# Patient Record
Sex: Female | Born: 1935 | Race: White | Hispanic: No | Smoking: Never smoker
Health system: Southern US, Community
[De-identification: ages and names within clinical notes are randomized; demographics above are authoritative.]

## PROBLEM LIST (undated history)

## (undated) DIAGNOSIS — F0280 Dementia in other diseases classified elsewhere without behavioral disturbance: Secondary | ICD-10-CM

## (undated) DIAGNOSIS — M8580 Other specified disorders of bone density and structure, unspecified site: Secondary | ICD-10-CM

## (undated) DIAGNOSIS — K219 Gastro-esophageal reflux disease without esophagitis: Secondary | ICD-10-CM

## (undated) DIAGNOSIS — E119 Type 2 diabetes mellitus without complications: Secondary | ICD-10-CM

## (undated) DIAGNOSIS — I639 Cerebral infarction, unspecified: Secondary | ICD-10-CM

## (undated) DIAGNOSIS — I10 Essential (primary) hypertension: Secondary | ICD-10-CM

## (undated) DIAGNOSIS — G309 Alzheimer's disease, unspecified: Secondary | ICD-10-CM

## (undated) DIAGNOSIS — N183 Chronic kidney disease, stage 3 (moderate): Secondary | ICD-10-CM

---

## 1997-06-14 ENCOUNTER — Other Ambulatory Visit: Admission: RE | Admit: 1997-06-14 | Discharge: 1997-06-14 | Payer: Self-pay | Admitting: *Deleted

## 1998-04-12 ENCOUNTER — Other Ambulatory Visit: Admission: RE | Admit: 1998-04-12 | Discharge: 1998-04-12 | Payer: Self-pay | Admitting: *Deleted

## 2004-10-08 ENCOUNTER — Emergency Department (HOSPITAL_COMMUNITY): Admission: EM | Admit: 2004-10-08 | Discharge: 2004-10-08 | Payer: Self-pay | Admitting: Emergency Medicine

## 2008-09-14 ENCOUNTER — Encounter: Admission: RE | Admit: 2008-09-14 | Discharge: 2008-09-14 | Payer: Self-pay | Admitting: Family Medicine

## 2009-05-31 ENCOUNTER — Emergency Department (HOSPITAL_COMMUNITY): Admission: EM | Admit: 2009-05-31 | Discharge: 2009-06-01 | Payer: Self-pay | Admitting: Emergency Medicine

## 2010-02-20 ENCOUNTER — Inpatient Hospital Stay (HOSPITAL_COMMUNITY)
Admission: EM | Admit: 2010-02-20 | Discharge: 2010-02-22 | Payer: Self-pay | Source: Home / Self Care | Attending: Internal Medicine | Admitting: Internal Medicine

## 2010-02-21 ENCOUNTER — Encounter (INDEPENDENT_AMBULATORY_CARE_PROVIDER_SITE_OTHER): Payer: Self-pay | Admitting: Internal Medicine

## 2010-05-13 LAB — COMPREHENSIVE METABOLIC PANEL
ALT: 17 U/L (ref 0–35)
ALT: 17 U/L (ref 0–35)
AST: 22 U/L (ref 0–37)
Albumin: 3.2 g/dL — ABNORMAL LOW (ref 3.5–5.2)
CO2: 29 mEq/L (ref 19–32)
Calcium: 8.6 mg/dL (ref 8.4–10.5)
Calcium: 8.8 mg/dL (ref 8.4–10.5)
Chloride: 93 mEq/L — ABNORMAL LOW (ref 96–112)
GFR calc Af Amer: >60 mL/min (ref >60)
GFR calc Af Amer: >60 mL/min (ref >60)
GFR calc non Af Amer: 57 mL/min — ABNORMAL LOW (ref >60)
Glucose, Bld: 155 mg/dL — ABNORMAL HIGH (ref 70–99)
Sodium: 129 mEq/L — ABNORMAL LOW (ref 135–145)
Sodium: 132 mEq/L — ABNORMAL LOW (ref 135–145)
Total Protein: 6.6 g/dL (ref 6.0–8.3)

## 2010-05-13 LAB — DIFFERENTIAL
Eosinophils Absolute: 0 10*3/uL (ref 0.0–0.7)
Lymphocytes Relative: 10 % — ABNORMAL LOW (ref 12–46)
Lymphs Abs: 1.3 10*3/uL (ref 0.7–4.0)
Monocytes Relative: 4 % (ref 3–12)
Neutrophils Relative %: 86 % — ABNORMAL HIGH (ref 43–77)

## 2010-05-13 LAB — CBC
HCT: 32.1 % — ABNORMAL LOW (ref 36.0–46.0)
HCT: 34.6 % — ABNORMAL LOW (ref 36.0–46.0)
HCT: 35 % — ABNORMAL LOW (ref 36.0–46.0)
Hemoglobin: 10.7 g/dL — ABNORMAL LOW (ref 12.0–15.0)
Hemoglobin: 11.8 g/dL — ABNORMAL LOW (ref 12.0–15.0)
MCH: 27.6 pg (ref 26.0–34.0)
MCH: 28.4 pg (ref 26.0–34.0)
MCHC: 34.1 g/dL (ref 30.0–36.0)
MCHC: 34.3 g/dL (ref 30.0–36.0)
MCHC: 34.4 g/dL (ref 30.0–36.0)
MCV: 82.6 fL (ref 78.0–100.0)
MCV: 82.7 fL (ref 78.0–100.0)
MCV: 82.8 fL (ref 78.0–100.0)
Platelets: 247 10*3/uL (ref 150–400)
RBC: 3.88 MIL/uL (ref 3.87–5.11)
RDW: 12.5 % (ref 11.5–15.5)
RDW: 12.5 % (ref 11.5–15.5)
WBC: 12.2 10*3/uL — ABNORMAL HIGH (ref 4.0–10.5)

## 2010-05-13 LAB — URINE CULTURE: Culture  Setup Time: 201112211743

## 2010-05-13 LAB — POCT CARDIAC MARKERS
CKMB, poc: 1.1 ng/mL (ref 1.0–8.0)
Myoglobin, poc: 158 ng/mL (ref 12–200)

## 2010-05-13 LAB — URINE MICROSCOPIC-ADD ON

## 2010-05-13 LAB — LIPID PANEL
HDL: 31 mg/dL — ABNORMAL LOW (ref >39)
Total CHOL/HDL Ratio: 4.9 RATIO
Triglycerides: 122 mg/dL (ref <150)
VLDL: 24 mg/dL (ref 0–40)

## 2010-05-13 LAB — BASIC METABOLIC PANEL
CO2: 26 mEq/L (ref 19–32)
CO2: 26 mEq/L (ref 19–32)
Calcium: 8.7 mg/dL (ref 8.4–10.5)
Chloride: 102 mEq/L (ref 96–112)
Chloride: 96 mEq/L (ref 96–112)
Creatinine, Ser: 0.96 mg/dL (ref 0.4–1.2)
Creatinine, Ser: 1.01 mg/dL (ref 0.4–1.2)
GFR calc Af Amer: >60 mL/min (ref >60)
Glucose, Bld: 137 mg/dL — ABNORMAL HIGH (ref 70–99)
Potassium: 3.7 mEq/L (ref 3.5–5.1)
Sodium: 131 mEq/L — ABNORMAL LOW (ref 135–145)

## 2010-05-13 LAB — GLUCOSE, CAPILLARY
Glucose-Capillary: 132 mg/dL — ABNORMAL HIGH (ref 70–99)
Glucose-Capillary: 133 mg/dL — ABNORMAL HIGH (ref 70–99)
Glucose-Capillary: 133 mg/dL — ABNORMAL HIGH (ref 70–99)

## 2010-05-13 LAB — CK TOTAL AND CKMB (NOT AT ARMC): CK, MB: 1.8 ng/mL (ref 0.3–4.0)

## 2010-05-13 LAB — HEMOGLOBIN A1C
Hgb A1c MFr Bld: 7.4 % — ABNORMAL HIGH (ref <5.7)
Mean Plasma Glucose: 166 mg/dL — ABNORMAL HIGH (ref <117)

## 2010-05-13 LAB — URINALYSIS, ROUTINE W REFLEX MICROSCOPIC
Bilirubin Urine: NEGATIVE
Hgb urine dipstick: NEGATIVE
Ketones, ur: NEGATIVE mg/dL
Protein, ur: NEGATIVE mg/dL
Urobilinogen, UA: 1 mg/dL (ref 0.0–1.0)

## 2010-05-13 LAB — PROTIME-INR: INR: 0.99 (ref 0.00–1.49)

## 2010-05-13 LAB — APTT: aPTT: 26 seconds (ref 24–37)

## 2010-05-13 LAB — CARDIAC PANEL(CRET KIN+CKTOT+MB+TROPI): Troponin I: 0.05 ng/mL (ref 0.00–0.06)

## 2010-05-27 LAB — CBC
MCHC: 34.1 g/dL (ref 30.0–36.0)
MCV: 86.2 fL (ref 78.0–100.0)
Platelets: 224 10*3/uL (ref 150–400)
RBC: 3.83 MIL/uL — ABNORMAL LOW (ref 3.87–5.11)
RDW: 12.8 % (ref 11.5–15.5)

## 2010-05-27 LAB — DIFFERENTIAL
Eosinophils Absolute: 0.1 10*3/uL (ref 0.0–0.7)
Eosinophils Relative: 1 % (ref 0–5)
Lymphocytes Relative: 15 % (ref 12–46)
Lymphs Abs: 1.3 10*3/uL (ref 0.7–4.0)
Monocytes Relative: 8 % (ref 3–12)

## 2010-05-27 LAB — RAPID URINE DRUG SCREEN, HOSP PERFORMED
Amphetamines: NOT DETECTED
Barbiturates: NOT DETECTED
Benzodiazepines: NOT DETECTED
Cocaine: NOT DETECTED
Opiates: NOT DETECTED

## 2010-05-27 LAB — COMPREHENSIVE METABOLIC PANEL
ALT: 23 U/L (ref 0–35)
AST: 29 U/L (ref 0–37)
Albumin: 3.3 g/dL — ABNORMAL LOW (ref 3.5–5.2)
CO2: 26 mEq/L (ref 19–32)
Calcium: 8.6 mg/dL (ref 8.4–10.5)
Creatinine, Ser: 1.15 mg/dL (ref 0.4–1.2)
GFR calc Af Amer: 55 mL/min — ABNORMAL LOW (ref >60)
GFR calc non Af Amer: 45 mL/min — ABNORMAL LOW (ref >60)
Sodium: 129 mEq/L — ABNORMAL LOW (ref 135–145)
Total Protein: 6.4 g/dL (ref 6.0–8.3)

## 2010-05-27 LAB — URINALYSIS, ROUTINE W REFLEX MICROSCOPIC
Bilirubin Urine: NEGATIVE
Glucose, UA: NEGATIVE mg/dL
Ketones, ur: NEGATIVE mg/dL
Nitrite: POSITIVE — AB
Specific Gravity, Urine: 1.015 (ref 1.005–1.030)
pH: 6.5 (ref 5.0–8.0)

## 2010-05-27 LAB — URINE CULTURE: Colony Count: >=100000

## 2010-05-27 LAB — URINE MICROSCOPIC-ADD ON

## 2010-10-02 ENCOUNTER — Encounter: Payer: Self-pay | Admitting: Podiatry

## 2012-02-11 ENCOUNTER — Inpatient Hospital Stay (HOSPITAL_BASED_OUTPATIENT_CLINIC_OR_DEPARTMENT_OTHER)
Admission: EM | Admit: 2012-02-11 | Discharge: 2012-02-16 | DRG: 176 | Disposition: A | Discharge status: Home Health | Payer: Medicare Other | Attending: Internal Medicine | Admitting: Internal Medicine

## 2012-02-11 ENCOUNTER — Emergency Department (HOSPITAL_BASED_OUTPATIENT_CLINIC_OR_DEPARTMENT_OTHER): Payer: Medicare Other

## 2012-02-11 ENCOUNTER — Encounter (HOSPITAL_BASED_OUTPATIENT_CLINIC_OR_DEPARTMENT_OTHER): Payer: Self-pay

## 2012-02-11 DIAGNOSIS — M25519 Pain in unspecified shoulder: Secondary | ICD-10-CM

## 2012-02-11 DIAGNOSIS — IMO0002 Reserved for concepts with insufficient information to code with codable children: Secondary | ICD-10-CM

## 2012-02-11 DIAGNOSIS — G309 Alzheimer's disease, unspecified: Secondary | ICD-10-CM | POA: Diagnosis present

## 2012-02-11 DIAGNOSIS — R0902 Hypoxemia: Secondary | ICD-10-CM

## 2012-02-11 DIAGNOSIS — F0390 Unspecified dementia without behavioral disturbance: Secondary | ICD-10-CM | POA: Diagnosis present

## 2012-02-11 DIAGNOSIS — Z8673 Personal history of transient ischemic attack (TIA), and cerebral infarction without residual deficits: Secondary | ICD-10-CM

## 2012-02-11 DIAGNOSIS — M899 Disorder of bone, unspecified: Secondary | ICD-10-CM | POA: Diagnosis present

## 2012-02-11 DIAGNOSIS — E119 Type 2 diabetes mellitus without complications: Secondary | ICD-10-CM | POA: Diagnosis present

## 2012-02-11 DIAGNOSIS — Z66 Do not resuscitate: Secondary | ICD-10-CM

## 2012-02-11 DIAGNOSIS — R627 Adult failure to thrive: Secondary | ICD-10-CM | POA: Diagnosis present

## 2012-02-11 DIAGNOSIS — Z79899 Other long term (current) drug therapy: Secondary | ICD-10-CM

## 2012-02-11 DIAGNOSIS — K219 Gastro-esophageal reflux disease without esophagitis: Secondary | ICD-10-CM | POA: Diagnosis present

## 2012-02-11 DIAGNOSIS — Z7982 Long term (current) use of aspirin: Secondary | ICD-10-CM

## 2012-02-11 DIAGNOSIS — I10 Essential (primary) hypertension: Secondary | ICD-10-CM | POA: Diagnosis present

## 2012-02-11 DIAGNOSIS — E876 Hypokalemia: Secondary | ICD-10-CM | POA: Diagnosis present

## 2012-02-11 DIAGNOSIS — N390 Urinary tract infection, site not specified: Secondary | ICD-10-CM

## 2012-02-11 DIAGNOSIS — I2699 Other pulmonary embolism without acute cor pulmonale: Principal | ICD-10-CM

## 2012-02-11 DIAGNOSIS — F0280 Dementia in other diseases classified elsewhere without behavioral disturbance: Secondary | ICD-10-CM | POA: Diagnosis present

## 2012-02-11 DIAGNOSIS — A498 Other bacterial infections of unspecified site: Secondary | ICD-10-CM | POA: Diagnosis present

## 2012-02-11 HISTORY — DX: Cerebral infarction, unspecified: I63.9

## 2012-02-11 HISTORY — DX: Alzheimer's disease, unspecified: G30.9

## 2012-02-11 HISTORY — DX: Gastro-esophageal reflux disease without esophagitis: K21.9

## 2012-02-11 HISTORY — DX: Other specified disorders of bone density and structure, unspecified site: M85.80

## 2012-02-11 HISTORY — DX: Dementia in other diseases classified elsewhere, unspecified severity, without behavioral disturbance, psychotic disturbance, mood disturbance, and anxiety: F02.80

## 2012-02-11 HISTORY — DX: Type 2 diabetes mellitus without complications: E11.9

## 2012-02-11 HISTORY — DX: Essential (primary) hypertension: I10

## 2012-02-11 LAB — URINALYSIS, ROUTINE W REFLEX MICROSCOPIC
Bilirubin Urine: NEGATIVE
Ketones, ur: NEGATIVE mg/dL
Nitrite: NEGATIVE
Protein, ur: 30 mg/dL — AB
Urobilinogen, UA: 1 mg/dL (ref 0.0–1.0)

## 2012-02-11 LAB — CBC WITH DIFFERENTIAL/PLATELET
Hemoglobin: 11.6 g/dL — ABNORMAL LOW (ref 12.0–15.0)
Lymphocytes Relative: 11 % — ABNORMAL LOW (ref 12–46)
Lymphs Abs: 1.3 10*3/uL (ref 0.7–4.0)
MCV: 84.3 fL (ref 78.0–100.0)
Monocytes Relative: 6 % (ref 3–12)
Neutrophils Relative %: 82 % — ABNORMAL HIGH (ref 43–77)
Platelets: 191 10*3/uL (ref 150–400)
RBC: 4.13 MIL/uL (ref 3.87–5.11)
WBC: 12.3 10*3/uL — ABNORMAL HIGH (ref 4.0–10.5)

## 2012-02-11 LAB — APTT: aPTT: 89 seconds — ABNORMAL HIGH (ref 24–37)

## 2012-02-11 LAB — URINE MICROSCOPIC-ADD ON

## 2012-02-11 LAB — COMPREHENSIVE METABOLIC PANEL
ALT: 23 U/L (ref 0–35)
Alkaline Phosphatase: 90 U/L (ref 39–117)
BUN: 16 mg/dL (ref 6–23)
CO2: 23 mEq/L (ref 19–32)
GFR calc Af Amer: 57 mL/min — ABNORMAL LOW (ref >90)
GFR calc non Af Amer: 50 mL/min — ABNORMAL LOW (ref >90)
Glucose, Bld: 182 mg/dL — ABNORMAL HIGH (ref 70–99)
Potassium: 3.1 mEq/L — ABNORMAL LOW (ref 3.5–5.1)
Sodium: 137 mEq/L (ref 135–145)
Total Bilirubin: 0.2 mg/dL — ABNORMAL LOW (ref 0.3–1.2)

## 2012-02-11 LAB — LACTIC ACID, PLASMA: Lactic Acid, Venous: 4.7 mmol/L — ABNORMAL HIGH (ref 0.5–2.2)

## 2012-02-11 LAB — OCCULT BLOOD X 1 CARD TO LAB, STOOL: Fecal Occult Bld: NEGATIVE

## 2012-02-11 LAB — GLUCOSE, CAPILLARY

## 2012-02-11 LAB — D-DIMER, QUANTITATIVE: D-Dimer, Quant: 6.1 ug/mL-FEU — ABNORMAL HIGH (ref 0.00–0.48)

## 2012-02-11 MED ORDER — SODIUM CHLORIDE 0.9 % IV SOLN: INTRAVENOUS | Status: AC

## 2012-02-11 MED ORDER — HEPARIN (PORCINE) IN NACL 100-0.45 UNIT/ML-% IJ SOLN
18.0000 [IU]/kg/h | Freq: Once | INTRAMUSCULAR | Status: AC
  Administered 2012-02-11: 18 [IU]/kg/h via INTRAVENOUS
  Filled 2012-02-11: qty 250

## 2012-02-11 MED ORDER — ONDANSETRON HCL 4 MG/2ML IJ SOLN: 4.0000 mg | Freq: Four times a day (QID) | INTRAMUSCULAR | Status: DC | PRN

## 2012-02-11 MED ORDER — PANTOPRAZOLE SODIUM 40 MG PO TBEC
40.0000 mg | DELAYED_RELEASE_TABLET | Freq: Every day | ORAL | Status: DC
  Administered 2012-02-11 – 2012-02-16 (×6): 40 mg via ORAL
  Filled 2012-02-11 (×6): qty 1

## 2012-02-11 MED ORDER — ONDANSETRON HCL 4 MG/2ML IJ SOLN: 4.0000 mg | Freq: Three times a day (TID) | INTRAMUSCULAR | Status: DC | PRN

## 2012-02-11 MED ORDER — CHOLECALCIFEROL 10 MCG (400 UNIT) PO TABS
400.0000 [IU] | ORAL_TABLET | Freq: Every day | ORAL | Status: DC
  Administered 2012-02-12 – 2012-02-16 (×5): 400 [IU] via ORAL
  Filled 2012-02-11 (×5): qty 1

## 2012-02-11 MED ORDER — INSULIN ASPART 100 UNIT/ML ~~LOC~~ SOLN
0.0000 [IU] | Freq: Three times a day (TID) | SUBCUTANEOUS | Status: DC
  Administered 2012-02-12: 3 [IU] via SUBCUTANEOUS
  Administered 2012-02-12 – 2012-02-13 (×3): 2 [IU] via SUBCUTANEOUS
  Administered 2012-02-14: 3 [IU] via SUBCUTANEOUS
  Administered 2012-02-14 (×2): 2 [IU] via SUBCUTANEOUS
  Administered 2012-02-15: 3 [IU] via SUBCUTANEOUS
  Administered 2012-02-15 (×2): 2 [IU] via SUBCUTANEOUS
  Administered 2012-02-16: 3 [IU] via SUBCUTANEOUS

## 2012-02-11 MED ORDER — DOCUSATE SODIUM 100 MG PO CAPS
100.0000 mg | ORAL_CAPSULE | Freq: Every day | ORAL | Status: DC
  Administered 2012-02-12 – 2012-02-16 (×5): 100 mg via ORAL
  Filled 2012-02-11 (×5): qty 1

## 2012-02-11 MED ORDER — HEPARIN (PORCINE) IN NACL 100-0.45 UNIT/ML-% IJ SOLN
1200.0000 [IU]/h | INTRAMUSCULAR | Status: DC
  Administered 2012-02-11 – 2012-02-12 (×2): 1200 [IU]/h via INTRAVENOUS
  Filled 2012-02-11: qty 250

## 2012-02-11 MED ORDER — HEPARIN BOLUS VIA INFUSION
4000.0000 [IU] | Freq: Once | INTRAVENOUS | Status: AC
  Administered 2012-02-11: 4000 [IU] via INTRAVENOUS

## 2012-02-11 MED ORDER — IOHEXOL 350 MG/ML SOLN
100.0000 mL | Freq: Once | INTRAVENOUS | Status: AC | PRN
  Administered 2012-02-11: 100 mL via INTRAVENOUS

## 2012-02-11 MED ORDER — VITAMIN D 400 UNITS PO TABS: 400.0000 [IU] | ORAL_TABLET | Freq: Every day | ORAL | Status: DC

## 2012-02-11 MED ORDER — DEXTROSE 5 % IV SOLN
1.0000 g | Freq: Once | INTRAVENOUS | Status: AC
  Administered 2012-02-11: 1 g via INTRAVENOUS
  Filled 2012-02-11: qty 10

## 2012-02-11 MED ORDER — DONEPEZIL HCL 10 MG PO TABS
10.0000 mg | ORAL_TABLET | Freq: Every day | ORAL | Status: DC
  Administered 2012-02-11 – 2012-02-15 (×5): 10 mg via ORAL
  Filled 2012-02-11 (×6): qty 1

## 2012-02-11 MED ORDER — SODIUM CHLORIDE 0.9 % IJ SOLN
3.0000 mL | Freq: Two times a day (BID) | INTRAMUSCULAR | Status: DC
  Administered 2012-02-11 – 2012-02-16 (×9): 3 mL via INTRAVENOUS

## 2012-02-11 MED ORDER — SODIUM CHLORIDE 0.9 % IV BOLUS (SEPSIS)
500.0000 mL | Freq: Once | INTRAVENOUS | Status: AC
  Administered 2012-02-11: 250 mL via INTRAVENOUS

## 2012-02-11 MED ORDER — INSULIN ASPART 100 UNIT/ML ~~LOC~~ SOLN: 0.0000 [IU] | Freq: Every day | SUBCUTANEOUS | Status: DC

## 2012-02-11 MED ORDER — DEXTROSE 5 % IV SOLN
1.0000 g | INTRAVENOUS | Status: AC
  Administered 2012-02-11 – 2012-02-15 (×5): 1 g via INTRAVENOUS
  Filled 2012-02-11 (×7): qty 10

## 2012-02-11 MED ORDER — ONDANSETRON HCL 4 MG PO TABS: 4.0000 mg | ORAL_TABLET | Freq: Four times a day (QID) | ORAL | Status: DC | PRN

## 2012-02-11 MED ORDER — LISINOPRIL 10 MG PO TABS
10.0000 mg | ORAL_TABLET | Freq: Every day | ORAL | Status: DC
  Administered 2012-02-11 – 2012-02-13 (×3): 10 mg via ORAL
  Filled 2012-02-11 (×3): qty 1

## 2012-02-11 MED ORDER — MORPHINE SULFATE 2 MG/ML IJ SOLN: 2.0000 mg | INTRAMUSCULAR | Status: DC | PRN

## 2012-02-11 MED ORDER — HEPARIN BOLUS VIA INFUSION
4000.0000 [IU] | Freq: Once | INTRAVENOUS | Status: AC
  Administered 2012-02-11: 4000 [IU] via INTRAVENOUS
  Filled 2012-02-11: qty 4000

## 2012-02-11 MED ORDER — SODIUM CHLORIDE 0.9 % IV SOLN: Freq: Once | INTRAVENOUS | Status: DC

## 2012-02-11 MED ORDER — MEMANTINE HCL 10 MG PO TABS
10.0000 mg | ORAL_TABLET | Freq: Two times a day (BID) | ORAL | Status: DC
  Administered 2012-02-11 – 2012-02-16 (×10): 10 mg via ORAL
  Filled 2012-02-11 (×11): qty 1

## 2012-02-11 NOTE — ED Provider Notes (Addendum)
History     CSN: [id number]  Arrival date & time 02/11/12  1351   First MD Initiated Contact with Patient 02/11/12 1457      Chief Complaint  Patient presents with  . Nausea  . Fatigue    (Consider location/radiation/quality/duration/timing/severity/associated sxs/prior treatment) HPI Comments: Patient presents from nursing home with one day of generalized weakness, fatigue, nausea. She is unable to provide a history secondary to dementia. She had a large bowel movement prior to arrival. She denies pain and does not know why she is here. Denies recent illness. No fever, chills, nausea, vomiting, abdominal pain or chest pain.  The history is provided by the patient, the EMS personnel and the nursing home. The history is limited by the condition of the patient.    Past Medical History  Diagnosis Date  . CVA (cerebral infarction)   . Alzheimer's dementia   . GERD (gastroesophageal reflux disease)   . Hypertension   . Osteopenia   . Diabetes mellitus without complication     No past surgical history on file.  No family history on file.  History  Substance Use Topics  . Smoking status: Unknown If Ever Smoked  . Smokeless tobacco: Not on file  . Alcohol Use: No    OB History    Grav Para Term Preterm Abortions TAB SAB Ect Mult Living                  Review of Systems  Unable to perform ROS: Dementia    Allergies  Review of patient's allergies indicates no known allergies.  Home Medications   Current Outpatient Rx  Name  Route  Sig  Dispense  Refill  . ASPIRIN 325 MG PO TABS   Oral   Take 325 mg by mouth daily.           Marland Kitchen CALTRATE 600 PLUS-VIT D PO   Oral   Take 600 mg by mouth.           . DOCUSATE SODIUM 100 MG PO CAPS   Oral   Take 100 mg by mouth daily.           . DONEPEZIL HCL 10 MG PO TABS   Oral   Take 10 mg by mouth at bedtime.           Marland Kitchen HYDROCHLOROTHIAZIDE 12.5 MG PO CAPS   Oral   Take 12.5 mg by mouth daily.           Marland Kitchen  LISINOPRIL 10 MG PO TABS   Oral   Take 10 mg by mouth daily.           Marland Kitchen MEMANTINE HCL 10 MG PO TABS   Oral   Take 10 mg by mouth 2 (two) times daily.           Marland Kitchen METFORMIN HCL ER 500 MG PO TB24   Oral   Take 500 mg by mouth 2 (two) times daily.           . TAB-A-VITE MAXIMUM PO   Oral   Take by mouth.           . OMEPRAZOLE 20 MG PO CPDR   Oral   Take 20 mg by mouth daily.           Marland Kitchen VITAMIN D 400 UNITS PO TABS   Oral   Take 400 Units by mouth daily.             BP 135/85  Pulse 113  Temp 98 F (36.7 C) (Rectal)  Resp 20  Wt 190 lb (86.183 kg)  SpO2 98%  Physical Exam  Constitutional: She is oriented to person, place, and time. She appears well-developed and well-nourished. No distress.  HENT:  Head: Normocephalic.  Mouth/Throat: Oropharynx is clear and moist. No oropharyngeal exudate.  Eyes: Conjunctivae normal and EOM are normal. Pupils are equal, round, and reactive to light.  Neck: Normal range of motion. Neck supple.       No meningismus  Cardiovascular: Normal rate, regular rhythm and normal heart sounds.   No murmur heard.      tachycardia  Pulmonary/Chest: Effort normal and breath sounds normal. No respiratory distress.  Abdominal: Soft. There is no tenderness. There is no rebound and no guarding.  Musculoskeletal: Normal range of motion. She exhibits no edema and no tenderness.  Neurological: She is alert and oriented to person, place, and time. No cranial nerve deficit. She exhibits normal muscle tone. Coordination normal.  Skin: Skin is warm.    ED Course  Procedures (including critical care time)  Labs Reviewed  CBC WITH DIFFERENTIAL - Abnormal; Notable for the following:    WBC 12.3 (*)     Hemoglobin 11.6 (*)     HCT 34.8 (*)     Neutrophils Relative 82 (*)     Neutro Abs 10.1 (*)     Lymphocytes Relative 11 (*)     All other components within normal limits  COMPREHENSIVE METABOLIC PANEL - Abnormal; Notable for the following:     Potassium 3.1 (*)     Glucose, Bld 182 (*)     Albumin 2.8 (*)     Total Bilirubin 0.2 (*)     GFR calc non Af Amer 50 (*)     GFR calc Af Amer 57 (*)     All other components within normal limits  URINALYSIS, ROUTINE W REFLEX MICROSCOPIC - Abnormal; Notable for the following:    APPearance CLOUDY (*)     Protein, ur 30 (*)     Leukocytes, UA SMALL (*)     All other components within normal limits  URINE MICROSCOPIC-ADD ON - Abnormal; Notable for the following:    Bacteria, UA MANY (*)     All other components within normal limits  LACTIC ACID, PLASMA - Abnormal; Notable for the following:    Lactic Acid, Venous 4.7 (*)     All other components within normal limits  D-DIMER, QUANTITATIVE - Abnormal; Notable for the following:    D-Dimer, Quant 6.10 (*)     All other components within normal limits  TROPONIN I  OCCULT BLOOD X 1 CARD TO LAB, STOOL  URINE CULTURE   Dg Chest 2 View  02/11/2012  *RADIOLOGY REPORT*  Clinical Data: Nausea, fatigue.  CHEST - 2 VIEW  Comparison: February 20, 2010.  Findings: Cardiomediastinal silhouette appears normal.  Old compression fracture of mid thoracic vertebral body is noted.  Mild interstitial densities are noted throughout both lungs most consistent with scarring.  No acute pulmonary disease is noted.  IMPRESSION: Probable bilateral scarring is noted.  No acute cardiopulmonary abnormality seen.   Original Report Authenticated By: Lupita Raider.,  M.D.    Ct Angio Chest Pe W/cm &/or Wo Cm  02/11/2012  *RADIOLOGY REPORT*  Clinical Data: Hypoxia short of breath  CT ANGIOGRAPHY CHEST  Technique:  Multidetector CT imaging of the chest using the standard protocol during bolus administration of intravenous contrast. Multiplanar reconstructed images  including MIPs were obtained and reviewed to evaluate the vascular anatomy.  Contrast: OMNIPAQUE IOHEXOL 350 MG/ML SOLN  Comparison: Chest x-ray from today  Findings: Pulmonary embolism is present  bilaterally.  Upper and lower lobe pulmonary arteries show filling defect bilaterally. Main pulmonary artery is normal.  Negative for pericardial effusion.  Heart size is normal.  Lungs are clear without infiltrate or effusion.  No mass or adenopathy. Small hiatal hernia.  Severe compression fracture of  lower thoracic spine is chronic and unchanged from prior studies.  IMPRESSION: Bilateral pulmonary emboli with moderate clot burden.  I discussed the findings by telephone with Dr. Manus Gunning   Original Report Authenticated By: Janeece Riggers, M.D.      1. Pulmonary emboli   2. Urinary tract infection   3. Hypoxia       MDM  Patient presents from nursing home with one day of fatigue and nausea. She has mild tachycardia and is hypoxic on room air 90%. She denies shortness of breath, cough or fever.  Obtain labs, chest x-ray, urinalysis, EKG  Chest x-ray shows no infiltrate. Urinalysis shows many bacteria, will treat with Rocephin. Given her tachycardia and hypoxia, CT angiogram of chest obtained.  Discussed with Dr. Kemper Durie positive pulmonary emboli bilaterally. No evidence of right heart strain. Heparin started.  Admission discussed with Dr. Zenaida Niece for stepdown. HR 110s, BP 120-130s and  systolic.    Date: 02/11/2012  Rate: 110  Rhythm: normal sinus rhythm  QRS Axis: normal  Intervals: normal  ST/T Wave abnormalities: nonspecific ST/T changes  Conduction Disutrbances:none  Narrative Interpretation:   Old EKG Reviewed: unchanged   CRITICAL CARE Performed by: Glynn Octave   Total critical care time: 30  Critical care time was exclusive of separately billable procedures and treating other patients.  Critical care was necessary to treat or prevent imminent or life-threatening deterioration.  Critical care was time spent personally by me on the following activities: development of treatment plan with patient and/or surrogate as well as nursing, discussions with consultants,  evaluation of patient's response to treatment, examination of patient, obtaining history from patient or surrogate, ordering and performing treatments and interventions, ordering and review of laboratory studies, ordering and review of radiographic studies, pulse oximetry and re-evaluation of patient's condition.    Glynn Octave, MD 02/11/12 1728  Glynn Octave, MD 02/11/12 1728

## 2012-02-11 NOTE — ED Notes (Signed)
Patient transported to X-ray 

## 2012-02-11 NOTE — Progress Notes (Signed)
ANTICOAGULATION CONSULT NOTE - Initial Consult  Pharmacy Consult for Heparin  Indication: pulmonary embolus  No Known Allergies  Patient Measurements: Weight: 190 lb (86.183 kg) Heparin dosing weight ~ 76kg  Vital Signs: Temp: 98 F (36.7 C) (12/11 1524) Temp src: Rectal (12/11 1524) BP: 152/91 mmHg (12/11 1804) Pulse Rate: 111  (12/11 1804)  Labs:  Basename 02/11/12 1425  HGB 11.6*  HCT 34.8*  PLT 191  APTT --  LABPROT --  INR --  HEPARINUNFRC --  CREATININE 1.00  CKTOTAL --  CKMB --  TROPONINI <0.30    CrCl is unknown because there is no height on file for the current visit.   Medical History: Past Medical History  Diagnosis Date  . CVA (cerebral infarction)   . Alzheimer's dementia   . GERD (gastroesophageal reflux disease)   . Hypertension   . Osteopenia   . Diabetes mellitus without complication     Assessment: 82 YOF presented from NH with generalized weakness, fatigue and nausea, CT angio positive for bilateral pulmonary emboli with moderate clot burden. Pharmacy is consulted to start IV heparin. hgb 11.6, plt 191. No anticoagulation prior to admission. Baseline aPTT, INR pending  Goal of Therapy:  Heparin level 0.3-0.7 units/ml Monitor platelets by anticoagulation protocol: Yes   Plan:  Heparin bolus 4000 units x 1 Heparin infusion 1200 units/hr F/u heparin level 8hrs after infusion start  Bayard Hugger, PharmD, BCPS  Clinical Pharmacist  Pager: [phone]  02/11/2012,9:04 PM

## 2012-02-11 NOTE — ED Notes (Signed)
Per EMS and nursing home staff pt has had generalized weakness, fatigue and nausea today.

## 2012-02-11 NOTE — ED Notes (Signed)
Patient transported to CT 

## 2012-02-11 NOTE — ED Notes (Signed)
[facility] at bedside preparing for transport 

## 2012-02-11 NOTE — ED Notes (Signed)
Report to [facility], RN Unit RN

## 2012-02-12 DIAGNOSIS — I10 Essential (primary) hypertension: Secondary | ICD-10-CM

## 2012-02-12 DIAGNOSIS — E876 Hypokalemia: Secondary | ICD-10-CM | POA: Diagnosis present

## 2012-02-12 DIAGNOSIS — I517 Cardiomegaly: Secondary | ICD-10-CM

## 2012-02-12 DIAGNOSIS — R0902 Hypoxemia: Secondary | ICD-10-CM

## 2012-02-12 LAB — HEPARIN LEVEL (UNFRACTIONATED)
Heparin Unfractionated: 1.25 IU/mL — ABNORMAL HIGH (ref 0.30–0.70)
Heparin Unfractionated: 0.28 IU/mL — ABNORMAL LOW (ref 0.30–0.70)

## 2012-02-12 LAB — GLUCOSE, CAPILLARY
Glucose-Capillary: 131 mg/dL — ABNORMAL HIGH (ref 70–99)
Glucose-Capillary: 171 mg/dL — ABNORMAL HIGH (ref 70–99)

## 2012-02-12 LAB — BASIC METABOLIC PANEL
BUN: 14 mg/dL (ref 6–23)
CO2: 26 mEq/L (ref 19–32)
Calcium: 8.4 mg/dL (ref 8.4–10.5)
Chloride: 102 mEq/L (ref 96–112)
Creatinine, Ser: 0.89 mg/dL (ref 0.50–1.10)
Glucose, Bld: 139 mg/dL — ABNORMAL HIGH (ref 70–99)

## 2012-02-12 LAB — CBC
HCT: 34.2 % — ABNORMAL LOW (ref 36.0–46.0)
Hemoglobin: 11.2 g/dL — ABNORMAL LOW (ref 12.0–15.0)
MCH: 27.7 pg (ref 26.0–34.0)
MCV: 84.4 fL (ref 78.0–100.0)
Platelets: 187 10*3/uL (ref 150–400)
RBC: 4.05 MIL/uL (ref 3.87–5.11)
WBC: 8.6 10*3/uL (ref 4.0–10.5)

## 2012-02-12 LAB — CK TOTAL AND CKMB (NOT AT ARMC)
CK, MB: 3.7 ng/mL (ref 0.3–4.0)
Relative Index: INVALID (ref 0.0–2.5)

## 2012-02-12 MED ORDER — POTASSIUM CHLORIDE CRYS ER 20 MEQ PO TBCR
40.0000 meq | EXTENDED_RELEASE_TABLET | Freq: Two times a day (BID) | ORAL | Status: AC
  Administered 2012-02-12 (×2): 40 meq via ORAL
  Filled 2012-02-12 (×2): qty 2

## 2012-02-12 MED ORDER — HEPARIN (PORCINE) IN NACL 100-0.45 UNIT/ML-% IJ SOLN
1250.0000 [IU]/h | INTRAMUSCULAR | Status: DC
  Administered 2012-02-12: 1000 [IU]/h via INTRAVENOUS
  Administered 2012-02-13: 1250 [IU]/h via INTRAVENOUS
  Filled 2012-02-12 (×3): qty 250

## 2012-02-12 MED ORDER — SODIUM CHLORIDE 0.9 % IV BOLUS (SEPSIS)
250.0000 mL | Freq: Once | INTRAVENOUS | Status: AC
  Administered 2012-02-12: 250 mL via INTRAVENOUS

## 2012-02-12 NOTE — Progress Notes (Signed)
  Echocardiogram 2D Echocardiogram has been performed.  Desiree Gonzalez 02/12/2012, 3:03 PM

## 2012-02-12 NOTE — Progress Notes (Signed)
Utilization review completed.  P.J. Khyron Garno,RN,BSN Case Manager [phone]  

## 2012-02-12 NOTE — H&P (Signed)
[facility] History and Physical  Desiree Gonzalez ZOX:[id number] DOB: 10/11/1923    PCP:   No primary provider on file.   Chief Complaint: Patient is demented and offered no complaints.  She was sent in from her NH because she appears weak.  HPI: Desiree Gonzalez is an 77 y.o. female with advanced dementia, DNR code status, NH resident, hx of HTN, prior CVA, DM, brought into the [facility] urgent care because of weakness.  She doesn't know where she is nor why she was there.  She offered no complaints.  Evaluation in the ER included unremarkable serology (K 3.1, BS 182, Cr 1.0 Na 137, WBC 12,3K, Hb 11.6 g/DL.  She was noted to have slight tachycardia, and a CTPA was obtained which showed bilateral pulmonary emboli with moderate clot burden.  Hospitalist was asked to admit her for further Tx.  Rewiew of Systems:  Unable to obtain meaningful ROS because of her advanced dementia.    Past Medical History  Diagnosis Date  . CVA (cerebral infarction)   . Alzheimer's dementia   . GERD (gastroesophageal reflux disease)   . Hypertension   . Osteopenia   . Diabetes mellitus without complication     No past surgical history on file.  Medications:  HOME MEDS: Prior to Admission medications   Medication Sig Start Date End Date Taking? Authorizing Provider  aspirin 325 MG tablet Take 325 mg by mouth daily.      Historical Provider, MD  Calcium-Vitamin D (CALTRATE 600 PLUS-VIT D PO) Take 600 mg by mouth.      Historical Provider, MD  docusate sodium (COLACE) 100 MG capsule Take 100 mg by mouth daily.      Historical Provider, MD  donepezil (ARICEPT) 10 MG tablet Take 10 mg by mouth at bedtime.      Historical Provider, MD  hydrochlorothiazide (,MICROZIDE/HYDRODIURIL,) 12.5 MG capsule Take 12.5 mg by mouth daily.      Historical Provider, MD  lisinopril (PRINIVIL,ZESTRIL) 10 MG tablet Take 10 mg by mouth daily.      Historical Provider, MD  memantine (NAMENDA) 10 MG tablet Take 10 mg by mouth 2  (two) times daily.      Historical Provider, MD  metFORMIN (GLUCOPHAGE-XR) 500 MG 24 hr tablet Take 500 mg by mouth 2 (two) times daily.      Historical Provider, MD  Multiple Vitamins-Minerals (TAB-A-VITE MAXIMUM PO) Take by mouth.      Historical Provider, MD  omeprazole (PRILOSEC) 20 MG capsule Take 20 mg by mouth daily.      Historical Provider, MD  vitamin D, CHOLECALCIFEROL, 400 UNITS tablet Take 400 Units by mouth daily.      Historical Provider, MD     Allergies:  No Known Allergies  Social History:   has an unknown smoking status. She does not have any smokeless tobacco history on file. She reports that she does not drink alcohol or use illicit drugs.  Family History: No family history on file.   Physical Exam: Filed Vitals:   02/11/12 1804 02/11/12 1920 02/11/12 2246 02/12/12 0001  BP: 152/91 138/87 142/97 125/75  Pulse: 111   91  Temp:  98.2 F (36.8 C)  97.4 F (36.3 C)  TempSrc:  Oral  Oral  Resp: 22 23  16   Height:  5\' 7"  (1.702 m)    Weight:  86.2 kg (190 lb 0.6 oz)    SpO2: 98% 97%  100%   Blood pressure 125/75, pulse 91, temperature 97.4  F (36.3 C), temperature source Oral, resp. rate 16, height 5\' 7"  (1.702 m), weight 86.2 kg (190 lb 0.6 oz), SpO2 100.00%.  GEN:  Pleasant  patient lying in the stretcher in no acute distress; cooperative with exam. PSYCH:  Totally confused.; does not appear anxious or depressed; affect is appropriate. HEENT: Mucous membranes pink and anicteric; PERRLA; EOM intact; no cervical lymphadenopathy nor thyromegaly or carotid bruit; no JVD; There were no stridor. Neck is very supple. Breasts:: Not examined CHEST WALL: No tenderness CHEST: Normal respiration, clear to auscultation bilaterally.  HEART: tachycardic but there are no murmur, rub, or gallops.   BACK: No kyphosis or scoliosis; no CVA tenderness ABDOMEN: soft and non-tender; no masses, no organomegaly, normal abdominal bowel sounds; no pannus; no intertriginous candida.  There is no rebound and no distention. Rectal Exam: Not done EXTREMITIES: No bone or joint deformity; age-appropriate arthropathy of the hands and knees; no edema; no ulcerations.  There is no calf tenderness. Genitalia: not examined PULSES: 2+ and symmetric SKIN: Normal hydration no rash or ulceration WUJ:WJXBJY symmetry, barbinski negative, moves all four extremities.  Uvula elevated with phonation.  PERRL.  Labs on Admission:  Basic Metabolic Panel:  Lab 02/11/12 7829  NA 137  K 3.1*  CL 100  CO2 23  GLUCOSE 182*  BUN 16  CREATININE 1.00  CALCIUM 8.5  MG --  PHOS --   Liver Function Tests:  Lab 02/11/12 1425  AST 25  ALT 23  ALKPHOS 90  BILITOT 0.2*  PROT 6.6  ALBUMIN 2.8*   No results found for this basename: LIPASE:5,AMYLASE:5 in the last 168 hours No results found for this basename: AMMONIA:5 in the last 168 hours CBC:  Lab 02/11/12 1425  WBC 12.3*  NEUTROABS 10.1*  HGB 11.6*  HCT 34.8*  MCV 84.3  PLT 191   Cardiac Enzymes:  Lab 02/11/12 1425  CKTOTAL --  CKMB --  CKMBINDEX --  TROPONINI <0.30    CBG:  Lab 02/11/12 2129  GLUCAP 124*     Radiological Exams on Admission: Dg Chest 2 View  02/11/2012  *RADIOLOGY REPORT*  Clinical Data: Nausea, fatigue.  CHEST - 2 VIEW  Comparison: February 20, 2010.  Findings: Cardiomediastinal silhouette appears normal.  Old compression fracture of mid thoracic vertebral body is noted.  Mild interstitial densities are noted throughout both lungs most consistent with scarring.  No acute pulmonary disease is noted.  IMPRESSION: Probable bilateral scarring is noted.  No acute cardiopulmonary abnormality seen.   Original Report Authenticated By: Lupita Raider.,  M.D.    Ct Angio Chest Pe W/cm &/or Wo Cm  02/11/2012  *RADIOLOGY REPORT*  Clinical Data: Hypoxia short of breath  CT ANGIOGRAPHY CHEST  Technique:  Multidetector CT imaging of the chest using the standard protocol during bolus administration of intravenous  contrast. Multiplanar reconstructed images including MIPs were obtained and reviewed to evaluate the vascular anatomy.  Contrast: OMNIPAQUE IOHEXOL 350 MG/ML SOLN  Comparison: Chest x-ray from today  Findings: Pulmonary embolism is present bilaterally.  Upper and lower lobe pulmonary arteries show filling defect bilaterally. Main pulmonary artery is normal.  Negative for pericardial effusion.  Heart size is normal.  Lungs are clear without infiltrate or effusion.  No mass or adenopathy. Small hiatal hernia.  Severe compression fracture of  lower thoracic spine is chronic and unchanged from prior studies.  IMPRESSION: Bilateral pulmonary emboli with moderate clot burden.  I discussed the findings by telephone with Dr. Manus Gunning   Original  Report Authenticated By: Janeece Riggers, M.D.      Assessment/Plan Present on Admission:  . Dementia . DM (diabetes mellitus) . HTN (hypertension) Pulmonary emboli Weakness.  PLAN:  It is rather amazing that she has bilateral PE with moderate clot burden, yet has practically no respiratory symptoms with 98 percent saturation.  In any event, will admit her to SDU, continue IV heparin.  I have deferred Xelralto or Coumadin to the day team, but I would use Coumadin in her situation as it will obviate the need for dosing changes and monitoring.  She has the "golden rod" and will be DNR.  I have continued her meds.  She is very stable and will be admitted to SDU as she arrived here as planned, although she probably can be transferred to the floor early tomorrow morning.  Thank you for allowing me to partake in the care of your patient.  Other plans as per orders.  Code Status: DNR/DNI.   Houston Siren, MD. [facility] Pager [phone] 7pm to 7am.  02/12/2012, 3:12 AM

## 2012-02-12 NOTE — Progress Notes (Signed)
ANTICOAGULATION CONSULT NOTE - Follow Up Consult  Pharmacy Consult for heparin Indication: pulmonary embolus  Labs:  Basename 02/12/12 0425 02/11/12 2119 02/11/12 1425  HGB -- -- 11.6*  HCT -- -- 34.8*  PLT -- -- 191  APTT -- 89* --  LABPROT -- 14.3 --  INR -- 1.13 --  HEPARINUNFRC 1.25* -- --  CREATININE -- -- 1.00  CKTOTAL -- -- --  CKMB -- -- --  TROPONINI -- -- <0.30    Assessment: 78yo female supratherapeutic on heparin with initial dosing for PE; per RN pt has bandages on both arms from lab sticks so unclear whether level was drawn correctly.  Goal of Therapy:  Heparin level 0.3-0.7 units/ml   Plan:  Will hold heparin gtt x46min then resume at lower rate of 1000 units/hr and check level in 8hr.  Colleen Can PharmD BCPS 02/12/2012,6:15 AM

## 2012-02-12 NOTE — Progress Notes (Addendum)
Clinical Social Work Department BRIEF PSYCHOSOCIAL ASSESSMENT 02/12/2012  Patient:  Desiree Gonzalez, Desiree Gonzalez     Account Number:  [id number]     Admit date:  02/11/2012  Clinical Social Worker:  Lourdes Sledge  Date/Time:  02/12/2012 10:40 AM  Referred by:  RN  Date Referred:  02/12/2012 Referred for  ALF Placement   Other Referral:   Interview type:  Family Other interview type:   CSW completed assessment with pt daughter Desiree Gonzalez [phone]    PSYCHOSOCIAL DATA Living Status:  FACILITY Admitted from facility:  [facility] [place] Level of care:  Assisted Living Primary support name:  Desiree Gonzalez [phone] Primary support relationship to patient:  CHILD, ADULT Degree of support available:   Pt daughter actively involved in pt care.    CURRENT CONCERNS Current Concerns  Post-Acute Placement   Other Concerns:    SOCIAL WORK ASSESSMENT / PLAN CSW informed that pt admitted from a rehab facility. CSW unable to complete assessment with pt who presents with dementia. CSW however contacted pt daughter Desiree Gonzalez [phone] who confirmed pt is from [facility] [phone]. CSW informed that pt was previously at [facility]. CSW informed that the plans is for pt to return when medically ready.    CSW has contacted the facility and confirmed that pt able to return pending pt does not need a higher level of care. CSW to contact the RN-HWO at the facility when pt is medically ready.   Assessment/plan status:  Psychosocial Support/Ongoing Assessment of Needs Other assessment/ plan:   Information/referral to community resources:   No resources needed at this time. If pt happens to need SNF placement, CSW will provide a SNF list.    PATIENT'S/FAMILY'S RESPONSE TO PLAN OF CARE: Pt presents with dementia however daughter agreeable to pt returning to [facility] at [employer].        Theresia Bough, MSW, Theresia Majors [phone]

## 2012-02-12 NOTE — Progress Notes (Signed)
Dr. Butler Denmark notified of continued low urine output,  only 45cc out for last 4 hours.  Dr. Butler Denmark now at bedside.  Will continue to monitor.  Roselie Awkward, RN

## 2012-02-12 NOTE — Progress Notes (Signed)
CRITICAL VALUE ALERT  Critical value received:  Trop 3.05  Date of notification:  02/12/2012  Time of notification:  1534  Critical value read back:yes  Nurse who received alert:  Roselie Awkward, RN  MD notified (1st page):  Dr. Butler Denmark  Time of first page:  1535  MD notified (2nd page): Dr. Butler Denmark  Time of second page: 1548  Responding MD:  Dr. Butler Denmark, no new orders  Time MD responded:  1549

## 2012-02-12 NOTE — Progress Notes (Signed)
Palliative Medicine Team SW Received consult to PMT phone for GOC at family request. Met with pt and dtr Desiree Gonzalez at bedside, pt pleasantly confused, receiving assistance with eating lunch. Dtr request GOC for tomorrow Fri 12/13 at 9am so that her sister can attend.   Provided empathic listening to dtr as she described pt's progressive confusion, need for ALF placement for the last 10 yrs, and sense that her mother "never wanted to live like this". Dtr reports pt has been at her current ALF [facility] for <51mo. Dtr also reports pt has had 50lb wt gain as she no longer walks around the facility as she used to. Dtr looking forward to discussion of options for care and direction on decisions moving forward. Provided dtr with PMT phone number should any needs arise before tomorrow's mtg. Dtr appreciative of support.   Kennieth Francois, [facility] Team Phone [phone] Pager [phone]

## 2012-02-12 NOTE — Progress Notes (Signed)
ANTICOAGULATION CONSULT NOTE - Follow Up Consult  Pharmacy Consult for heparin Indication: pulmonary embolus  Labs:  Basename 02/12/12 1411 02/12/12 0817 02/12/12 0425 02/11/12 2119 02/11/12 1425  HGB -- 11.2* -- -- 11.6*  HCT -- 34.2* -- -- 34.8*  PLT -- 187 -- -- 191  APTT -- -- -- 89* --  LABPROT -- -- -- 14.3 --  INR -- -- -- 1.13 --  HEPARINUNFRC 0.28* -- 1.25* -- --  CREATININE -- 0.89 -- -- 1.00  CKTOTAL -- -- -- -- --  CKMB -- -- -- -- --  TROPONINI -- -- -- -- <0.30    Assessment: 78 y/o female patient receiving heparin for b/l PE. Xa is slightly subtherapeutic after gtt held and dose reduction. Will increase rate slightly. No coumadin as of yet, may not be candidate for oral anticoag.  Goal of Therapy:  Heparin level 0.3-0.7 units/ml   Plan:  Increase heparin gtt to 1050 units/hr and check level in 8 hours.   Verlene Mayer, PharmD, BCPS Pager [phone] 02/12/2012,3:24 PM

## 2012-02-12 NOTE — Progress Notes (Signed)
[facility] PROGRESS NOTE  Desiree Gonzalez JWJ:[id number] DOB: 1923/06/01 DOA: 02/11/2012 PCP: No primary provider on file.  Brief narrative: Desiree Gonzalez is an 78 y.o. female with advanced dementia, DNR code status, NH resident, hx of HTN, prior CVA, DM, brought into the [facility] urgent care because of weakness. She doesn't know where she is nor why she was there. She offered no complaints. She was noted to have slight tachycardia, and a CTA of chest was obtained which showed bilateral pulmonary emboli with moderate clot burden. Hospitalist was asked to admit her for further Tx.   Assessment/Plan: Principal Problem:  *Pulmonary emboli Currently on heparin infusion- daughter, Elson Clan, does not want aggressive treatment and doubt that she will want long term antibiotics- will make a decision after palliative care meeting.  Troponin's repeated and noted to be positive suggestive of RV strain.   Active Problems:  Urinary tract infection UA is actually quite mildly positive - f/u cultures   Hypokalemia Replace   Hypoxia Now with pulse ox of 94% on RA   Dementia Quite severe memory deficit which apparently has worsened rapidly in the past 2 months.    DM (diabetes mellitus) Controlled   HTN (hypertension) Cont home meds   DNR (do not resuscitate) Re-confirmed with daughter today   Code Status: DNR Family Communication: daughter and POA, Patton Salles Disposition Plan: cont to follow in SDU DVT prophylaxis: Heparin infusion  Antibiotics:  Rocephin 12/11  HPI/Subjective: Pt alert, calm and cooperative but very poor recent memory - cannot give history spoke with daughter, Elson Clan (POA) at bedside who states her mother has had a significant decline in the past 2 months and has had decreased mobility along with worsening memory issues. She has not been able to recogize Letha in about a yr and her other daughter for a few yrs. She is considering comfort care and asking for a  palliative care consult.   Objective: Filed Vitals:   02/12/12 0300 02/12/12 0400 02/12/12 0800 02/12/12 1200  BP: 142/80 149/77 136/70 140/67  Pulse: 90 100 86 89  Temp: 98 F (36.7 C)  97.6 F (36.4 C) 98.2 F (36.8 C)  TempSrc: Oral  Oral Oral  Resp: 17 20 18 20   Height:      Weight:      SpO2: 97% 96% 97% 100%    Intake/Output Summary (Last 24 hours) at 02/12/12 1558 Last data filed at 02/12/12 1500  Gross per 24 hour  Intake 1183.43 ml  Output    600 ml  Net 583.43 ml    Exam:   General:  Alert, calm, oriented only to name, does not recognize her daughter  Cardiovascular: RRR, no murmurs  Respiratory: CTA b/l   Abdomen: soft, NT, ND, BS+  Ext:no c/c/e  Data Reviewed: Basic Metabolic Panel:  Lab 02/12/12 6213 02/11/12 1425  NA 139 137  K 3.2* 3.1*  CL 102 100  CO2 26 23  GLUCOSE 139* 182*  BUN 14 16  CREATININE 0.89 1.00  CALCIUM 8.4 8.5  MG -- --  PHOS -- --   Liver Function Tests:  Lab 02/11/12 1425  AST 25  ALT 23  ALKPHOS 90  BILITOT 0.2*  PROT 6.6  ALBUMIN 2.8*   No results found for this basename: LIPASE:5,AMYLASE:5 in the last 168 hours No results found for this basename: AMMONIA:5 in the last 168 hours CBC:  Lab 02/12/12 0817 02/11/12 1425  WBC 8.6 12.3*  NEUTROABS -- 10.1*  HGB 11.2* 11.6*  HCT 34.2* 34.8*  MCV 84.4 84.3  PLT 187 191   Cardiac Enzymes:  Lab 02/12/12 1411 02/11/12 1425  CKTOTAL 27 --  CKMB 3.7 --  CKMBINDEX -- --  TROPONINI 3.05* <0.30   BNP (last 3 results) No results found for this basename: PROBNP:3 in the last 8760 hours CBG:  Lab 02/12/12 1231 02/12/12 0718 02/11/12 2129  GLUCAP 171* 131* 124*    Recent Results (from the past 240 hour(s))  MRSA PCR SCREENING     Status: Normal   Collection Time   02/11/12  7:28 PM      Component Value Range Status Comment   MRSA by PCR NEGATIVE  NEGATIVE Final      Studies: Dg Chest 2 View  02/11/2012  *RADIOLOGY REPORT*  Clinical Data: Nausea,  fatigue.  CHEST - 2 VIEW  Comparison: February 20, 2010.  Findings: Cardiomediastinal silhouette appears normal.  Old compression fracture of mid thoracic vertebral body is noted.  Mild interstitial densities are noted throughout both lungs most consistent with scarring.  No acute pulmonary disease is noted.  IMPRESSION: Probable bilateral scarring is noted.  No acute cardiopulmonary abnormality seen.   Original Report Authenticated By: Lupita Raider.,  M.D.    Ct Angio Chest Pe W/cm &/or Wo Cm  02/11/2012  *RADIOLOGY REPORT*  Clinical Data: Hypoxia short of breath  CT ANGIOGRAPHY CHEST  Technique:  Multidetector CT imaging of the chest using the standard protocol during bolus administration of intravenous contrast. Multiplanar reconstructed images including MIPs were obtained and reviewed to evaluate the vascular anatomy.  Contrast: OMNIPAQUE IOHEXOL 350 MG/ML SOLN  Comparison: Chest x-ray from today  Findings: Pulmonary embolism is present bilaterally.  Upper and lower lobe pulmonary arteries show filling defect bilaterally. Main pulmonary artery is normal.  Negative for pericardial effusion.  Heart size is normal.  Lungs are clear without infiltrate or effusion.  No mass or adenopathy. Small hiatal hernia.  Severe compression fracture of  lower thoracic spine is chronic and unchanged from prior studies.  IMPRESSION: Bilateral pulmonary emboli with moderate clot burden.  I discussed the findings by telephone with Dr. Manus Gunning   Original Report Authenticated By: Janeece Riggers, M.D.     Scheduled Meds:   . sodium chloride   Intravenous STAT  . cefTRIAXone (ROCEPHIN)  IV  1 g Intravenous Q24H  . cholecalciferol  400 Units Oral Daily  . docusate sodium  100 mg Oral Daily  . donepezil  10 mg Oral QHS  . insulin aspart  0-15 Units Subcutaneous TID WC  . insulin aspart  0-5 Units Subcutaneous QHS  . lisinopril  10 mg Oral Daily  . memantine  10 mg Oral BID  . pantoprazole  40 mg Oral Daily  .  potassium chloride  40 mEq Oral BID  . sodium chloride  3 mL Intravenous Q12H   Continuous Infusions:   . heparin 1,050 Units/hr (02/12/12 1551)    ________________________________________________________________________  Time spent: 4    [facility]  [facility] Pager [phone] If 8PM-8AM, please contact night-coverage at [email or web address], password [facility] 02/12/2012, 3:58 PM  LOS: 1 day

## 2012-02-12 NOTE — Progress Notes (Signed)
UOP only 30cc for previous 4 hours.  0cc shown on bladder scan.  Dr. Butler Denmark notified, orders received.  Will continue to monitor and push fluids.  Roselie Awkward, RN

## 2012-02-13 ENCOUNTER — Encounter (HOSPITAL_COMMUNITY): Payer: Self-pay

## 2012-02-13 DIAGNOSIS — M25519 Pain in unspecified shoulder: Secondary | ICD-10-CM

## 2012-02-13 DIAGNOSIS — IMO0002 Reserved for concepts with insufficient information to code with codable children: Secondary | ICD-10-CM

## 2012-02-13 LAB — HEPARIN LEVEL (UNFRACTIONATED)
Heparin Unfractionated: 0.32 IU/mL (ref 0.30–0.70)
Heparin Unfractionated: 0.25 IU/mL — ABNORMAL LOW (ref 0.30–0.70)
Heparin Unfractionated: 0.31 IU/mL (ref 0.30–0.70)

## 2012-02-13 LAB — GLUCOSE, CAPILLARY
Glucose-Capillary: 109 mg/dL — ABNORMAL HIGH (ref 70–99)
Glucose-Capillary: 138 mg/dL — ABNORMAL HIGH (ref 70–99)

## 2012-02-13 LAB — URINE CULTURE

## 2012-02-13 LAB — BASIC METABOLIC PANEL
CO2: 23 mEq/L (ref 19–32)
Calcium: 8.5 mg/dL (ref 8.4–10.5)
Chloride: 104 mEq/L (ref 96–112)
Creatinine, Ser: 1.06 mg/dL (ref 0.50–1.10)
Glucose, Bld: 119 mg/dL — ABNORMAL HIGH (ref 70–99)

## 2012-02-13 LAB — CBC
HCT: 33.8 % — ABNORMAL LOW (ref 36.0–46.0)
Hemoglobin: 11.2 g/dL — ABNORMAL LOW (ref 12.0–15.0)
MCHC: 33.1 g/dL (ref 30.0–36.0)
MCV: 84.5 fL (ref 78.0–100.0)

## 2012-02-13 MED ORDER — AMLODIPINE BESYLATE 5 MG PO TABS: 5.0000 mg | ORAL_TABLET | Freq: Every day | ORAL | Status: DC

## 2012-02-13 MED ORDER — MORPHINE SULFATE 2 MG/ML IJ SOLN: 2.0000 mg | INTRAMUSCULAR | Status: DC | PRN

## 2012-02-13 MED ORDER — OXYCODONE HCL 5 MG PO TABS
5.0000 mg | ORAL_TABLET | ORAL | Status: DC | PRN
  Administered 2012-02-15: 5 mg via ORAL
  Filled 2012-02-13: qty 1

## 2012-02-13 MED ORDER — ADULT MULTIVITAMIN W/MINERALS CH
1.0000 | ORAL_TABLET | Freq: Every morning | ORAL | Status: DC
  Administered 2012-02-14 – 2012-02-16 (×3): 1 via ORAL
  Filled 2012-02-13 (×4): qty 1

## 2012-02-13 MED ORDER — ACETAMINOPHEN 325 MG PO TABS: 650.0000 mg | ORAL_TABLET | Freq: Four times a day (QID) | ORAL | Status: DC | PRN

## 2012-02-13 MED ORDER — HEPARIN (PORCINE) IN NACL 100-0.45 UNIT/ML-% IJ SOLN
1300.0000 [IU]/h | INTRAMUSCULAR | Status: DC
  Administered 2012-02-14: 1300 [IU]/h via INTRAVENOUS
  Filled 2012-02-13 (×2): qty 250

## 2012-02-13 MED ORDER — AMLODIPINE BESYLATE 10 MG PO TABS
10.0000 mg | ORAL_TABLET | Freq: Every day | ORAL | Status: DC
  Administered 2012-02-13 – 2012-02-14 (×2): 10 mg via ORAL
  Filled 2012-02-13 (×2): qty 1

## 2012-02-13 NOTE — Progress Notes (Addendum)
[facility] Progress Note [name] TEAM 1 - Stepdown/ICU TEAM   Desiree Gonzalez MWN:[id number] DOB: 07/01/1923 DOA: 02/11/2012 PCP: No primary provider on file.  Brief narrative: 78 yo female with advanced dementia, DNR code status, NH resident, hx of HTN, prior CVA, DM, brought into the [facility] urgent care because of weakness. She didn't know where she was nor why she was there. She offered no complaints.  She was noted to have slight tachycardia, and a CTA of chest was obtained which showed bilateral pulmonary emboli with moderate clot burden.   Assessment/Plan:  Pulmonary emboli  Cont heparin alone as family determines GOC - to meet with PC Team this morning - elevated troponin c/w R heart strain in setting of clot burden  E coli Urinary tract infection  Cont empiric coverage - f/u sensitivity on culture when available - d/c foley cath  Hypokalemia  Replaced - likely due to poor intake   Hypoxia  Now requiring only minimal O2 support - attempt to wean to RA - follow   Dementia  Quite severe memory deficit which apparently has worsened rapidly in the past 2 months.   DM  Reasonably controlled at this time    HTN D/c HCTZ and ACE in this elderly demented female who will be prone to poor intake and DH - begin Norvasc and follow   Code Status: DNR Family Communication: PC Team meeting with family Disposition Plan: transfer to medical bed while family deciding on GOC  Consultants: Palliative Care Team  Procedures: 12/12 - TTE - preserved LV systolic fxn - Grade I diastolic dysfxn - no indication of RV strain/failure  Antibiotics: Rocephin 12/11 >>  DVT prophylaxis: Heparin gtt  HPI/Subjective: The pt is awake and alert and quite pleasant.  She does not appear to be in any distress.  She is confused, but is not agitated.     Objective: Blood pressure 152/76, pulse 75, temperature 98.2 F (36.8 C), temperature source Oral, resp. rate 17, height 5\' 7"  (1.702  m), weight 86.2 kg (190 lb 0.6 oz), SpO2 93.00%.  Intake/Output Summary (Last 24 hours) at 02/13/12 1128 Last data filed at 02/13/12 1100  Gross per 24 hour  Intake 934.91 ml  Output   1070 ml  Net -135.09 ml     Exam: General: No acute respiratory distress Lungs: Clear to auscultation bilaterally without wheezes or crackles Cardiovascular: Regular rate and rhythm without murmur gallop or rub  Abdomen: Nontender, nondistended, soft, bowel sounds positive, no rebound, no ascites, no appreciable mass Extremities: No significant cyanosis, clubbing, or edema bilateral lower extremities  Data Reviewed: Basic Metabolic Panel:  Lab 02/13/12 4034 02/12/12 0817 02/11/12 1425  NA 138 139 137  K 4.1 3.2* 3.1*  CL 104 102 100  CO2 23 26 23   GLUCOSE 119* 139* 182*  BUN 17 14 16   CREATININE 1.06 0.89 1.00  CALCIUM 8.5 8.4 8.5  MG -- -- --  PHOS -- -- --   Liver Function Tests:  Lab 02/11/12 1425  AST 25  ALT 23  ALKPHOS 90  BILITOT 0.2*  PROT 6.6  ALBUMIN 2.8*   CBC:  Lab 02/13/12 0310 02/12/12 0817 02/11/12 1425  WBC 8.1 8.6 12.3*  NEUTROABS -- -- 10.1*  HGB 11.2* 11.2* 11.6*  HCT 33.8* 34.2* 34.8*  MCV 84.5 84.4 84.3  PLT 198 187 191   Cardiac Enzymes:  Lab 02/12/12 1411 02/11/12 1425  CKTOTAL 27 --  CKMB 3.7 --  CKMBINDEX -- --  TROPONINI  3.05* <0.30   CBG:  Lab 02/13/12 0738 02/12/12 2127 02/12/12 1639 02/12/12 1231 02/12/12 0718  GLUCAP 131* 152* 103* 171* 131*    Recent Results (from the past 240 hour(s))  URINE CULTURE     Status: Normal (Preliminary result)   Collection Time   02/11/12  2:35 PM      Component Value Range Status Comment   Specimen Description URINE, CATHETERIZED   Final    Special Requests NONE   Final    Culture  Setup Time 02/12/2012 01:24   Final    Colony Count >=100,000 COLONIES/ML   Final    Culture ESCHERICHIA COLI   Final    Report Status PENDING   Incomplete   MRSA PCR SCREENING     Status: Normal   Collection Time    02/11/12  7:28 PM      Component Value Range Status Comment   MRSA by PCR NEGATIVE  NEGATIVE Final      Studies:  Recent x-ray studies have been reviewed in detail by the Attending Physician  Scheduled Meds:  Reviewed in detail by the Attending Physician   Lonia Blood, MD [facility] Office  [phone] Pager [phone]  On-Call/Text Page:      [email or web address]      password [email or web address]  If 7PM-7AM, please contact night-coverage [email or web address] Password [email or web address] 02/13/2012, 11:28 AM   LOS: 2 days

## 2012-02-13 NOTE — Progress Notes (Signed)
ANTICOAGULATION CONSULT NOTE - Follow Up Consult  Pharmacy Consult for heparin Indication: pulmonary embolus  Labs:  Basename 02/13/12 0947 02/13/12 0310 02/12/12 2255 02/12/12 1411 02/12/12 0817 02/11/12 2119 02/11/12 1425  HGB -- 11.2* -- -- 11.2* -- --  HCT -- 33.8* -- -- 34.2* -- 34.8*  PLT -- 198 -- -- 187 -- 191  APTT -- -- -- -- -- 89* --  LABPROT -- -- -- -- -- 14.3 --  INR -- -- -- -- -- 1.13 --  HEPARINUNFRC 0.25* -- 0.32 0.28* -- -- --  CREATININE -- 1.06 -- -- 0.89 -- 1.00  CKTOTAL -- -- -- 27 -- -- --  CKMB -- -- -- 3.7 -- -- --  TROPONINI -- -- -- 3.05* -- -- <0.30    Assessment: 78 y/o female patient receiving heparin for b/l PE. Heparin level is slightly subtherapeutic on 1100 units/hr, Will increase gtt further, estimate that xa level of 1.25 was error. Large clot burden.  Goal of Therapy:  Heparin level 0.3-0.7 units/ml   Plan:  Increase heparin gtt to 1250 units/hr and recheck in 8 hours.  Verlene Mayer, PharmD, BCPS Pager [phone] 02/13/2012,11:34 AM

## 2012-02-13 NOTE — Progress Notes (Signed)
PMT SW Psychosocial follow up. Pt alone, in good spirits, pleasantly confused. Provided some companionship to pt. Spoke with dtr Elson Clan by phone who reports appreciation for GOC mtg this morning. Dtr reports continued good coping and knows to call PMT phone as needs arise. Available as needed.   Kennieth Francois, [place] Pager [phone] PMT Phone [phone]

## 2012-02-13 NOTE — Progress Notes (Signed)
Pt transferred from 3300. Unit CSW to now begin following and facilitate with dc plans. CSW signing off.  Theresia Bough, MSW, Theresia Majors [phone]

## 2012-02-13 NOTE — Progress Notes (Signed)
Patient transferred to [facility] room 25. Report called to RN on 6N and all questions answered. Patient transferred via wheelchair with NT, family aware of new room assignment.

## 2012-02-13 NOTE — Progress Notes (Signed)
Foley d/c'd per MD order. Patient tolerated well, will continue to monitor.

## 2012-02-13 NOTE — Progress Notes (Signed)
ANTICOAGULATION CONSULT NOTE - Follow Up Consult  Pharmacy Consult for heparin Indication: pulmonary embolus  Labs:  Basename 02/12/12 2255 02/12/12 1411 02/12/12 0817 02/12/12 0425 02/11/12 2119 02/11/12 1425  HGB -- -- 11.2* -- -- 11.6*  HCT -- -- 34.2* -- -- 34.8*  PLT -- -- 187 -- -- 191  APTT -- -- -- -- 89* --  LABPROT -- -- -- -- 14.3 --  INR -- -- -- -- 1.13 --  HEPARINUNFRC 0.32 0.28* -- 1.25* -- --  CREATININE -- -- 0.89 -- -- 1.00  CKTOTAL -- 27 -- -- -- --  CKMB -- 3.7 -- -- -- --  TROPONINI -- 3.05* -- -- -- <0.30    Assessment: 78 y/o female patient receiving heparin for b/l PE. Heparin level (0.32) is at lower-end of goal on 1050 units/hr. No problem with line/infusion and no signs/symptoms of bleeding per RN.   Initial heparin infusion of 1200 units/hr produced an elevated heparin level (1.25)  Goal of Therapy:  Heparin level 0.3-0.7 units/ml   Plan:  1. Increase IV heparin infusion to 1100 units/hr to keep at-goal.  2. Heparin level in 8 hours.   Lorre Munroe 02/13/2012,12:16 AM

## 2012-02-13 NOTE — Progress Notes (Signed)
Anticoagulation Consult Note: Heparin  Heparin level = 0.31 on 1250 units/hr Goal heparin level = 0.3-0.7  Indication: Bilateral PE  Heparin level is in desired therapeutic range but at the lower end. Will increase the rate slightly to 1300 units/hr to keep the heparin level therapeutic. Will follow up heparin level with AM labs.  Cardell Peach, PharmD

## 2012-02-13 NOTE — Progress Notes (Signed)
Patient ZO:XWRUEAV Desiree Gonzalez      DOB: 1924/01/11      WUJ:[id number]     Consult Note from the Palliative Medicine Team at [facility]    Consult Requested by: Dr Butler Denmark     PCP: No primary provider on file. Reason for Consultation:Goals of Care     Phone Number:None  Assessment of patients Current state: Patient sitting up in bed, appears comfortable, pleasantly confused. Able to respond to simple questions, follows some commands.  Reviewed chart, spoke with staff, proceeded to have Goals of Care meeting with patients daughter Desiree Gonzalez. We talked about patients overall quality of life and functional status over past several years. Patient was in [facility] for 10 years, history of dementia, was wandering at times. Moved to [facility] 6 weeks ago due to insurance coverage. Overall patient has been walking less, appetite is good to fair, but now needs fed or prompted to eat. Incontinent and needs assistance with ADL's. Daughter stated mother had fall in recent past and sustained R shoulder injury.  Discussed the philosophy of palliative care and hospice support and services provided. Also engaged in decision with family regarding concepts of Medical Orders for Scope of Treatment pertaining to cardiac and pulmonary resuscitation, desire for acute future interventions for acute issues, the use of antibiotic therapy, intravenous hydration and continuing artificial tube feedings. MOST form completed, signed and placed on chart. Hard Choices booklet given to daughter   Goals of Care/Scope of Care: Code Status: DNR/DNI  -Wants to continue limited interventions for any acute issues at this point.   -Would want to have mother brought back into the hospital for any acute issues for evaluation and treatment.   -Would want the use of IV fluids for defined period and the use of antibiotics for identified infection   -Would not want artificial tube feedings for nutritional  support  -Want to continue treatment for PE intervention with anticoagulant therapy, has concerns about using Coumadin due to mother having had a fall in the past.  -daughter would like PT/OT evaluation to determine appropriate level of care mother would need at discharge. Agreeable to having Palliative Care or Support services follow from either [facility] or [facility] [address]   4. Disposition: pending but recommendations would include Palliative Care or Support services to follow-if patient declines in the future daughter would consider full hospice support   3. Symptom Management  1. Pain:Oxycodone IR as needed 2. Bowel Regimen:Colace daily 3. Fever:Tylenol as needed  4. Psychosocial:Emotional support rendered to patients daughter in goals of care decision making process  5. Spiritual:consult placed   Brief HPI: 78 yo female with advanced dementia, DNR code status, NH resident, hx of HTN, prior CVA, DM, brought into the [facility] urgent care because of weakness. She didn't know where she was nor why she was there. She offered no complaints.  She was noted to have slight tachycardia, and a CTA of chest was obtained which showed bilateral pulmonary emboli with moderate clot burden.     ROS: denied pain, but due to dementia unable to elicit full ROS    PMH:  Past Medical History  Diagnosis Date  . CVA (cerebral infarction)   . Alzheimer's dementia   . GERD (gastroesophageal reflux disease)   . Hypertension   . Osteopenia   . Diabetes mellitus without complication      NFA:OZHYQMV reviewed. No pertinent past surgical history. I have reviewed the FH and SH and  If appropriate update it with new information. No Known Allergies Scheduled Meds:   . amLODipine  10 mg Oral Daily  . cefTRIAXone (ROCEPHIN)  IV  1 g Intravenous Q24H  . cholecalciferol  400 Units Oral Daily  . docusate sodium  100 mg Oral Daily  . donepezil  10 mg Oral QHS  . insulin aspart  0-15  Units Subcutaneous TID WC  . insulin aspart  0-5 Units Subcutaneous QHS  . memantine  10 mg Oral BID  . multivitamin with minerals  1 tablet Oral q morning - 10a  . pantoprazole  40 mg Oral Daily  . sodium chloride  3 mL Intravenous Q12H   Continuous Infusions:   . heparin 1,250 Units/hr (02/13/12 1400)   PRN Meds:.acetaminophen, morphine injection, ondansetron (ZOFRAN) IV, ondansetron, oxyCODONE    BP 155/75  Pulse 88  Temp 97.7 F (36.5 C) (Oral)  Resp 16  Ht 5\' 7"  (1.702 m)  Wt 86.2 kg (190 lb 0.6 oz)  BMI 29.76 kg/m2  SpO2 97%   PPS: 40%   Intake/Output Summary (Last 24 hours) at 02/13/12 1532 Last data filed at 02/13/12 1400  Gross per 24 hour  Intake 692.41 ml  Output   1325 ml  Net -632.59 ml   RUE:AVWUJ to admission                       Stool Softner: yes  Physical Exam:  General: Alert, pleasantly confused HEENT:  Anicteric, buccal mucosa moist Chest:CTA bilaterally  CVS: RRR Abdomen:soft, non-tender, BS audible Ext: no pedal edema Neuro:oriented to self  Labs: CBC    Component Value Date/Time   WBC 8.1 02/13/2012 0310   RBC 4.00 02/13/2012 0310   HGB 11.2* 02/13/2012 0310   HCT 33.8* 02/13/2012 0310   PLT 198 02/13/2012 0310   MCV 84.5 02/13/2012 0310   MCH 28.0 02/13/2012 0310   MCHC 33.1 02/13/2012 0310   RDW 12.9 02/13/2012 0310   LYMPHSABS 1.3 02/11/2012 1425   MONOABS 0.8 02/11/2012 1425   EOSABS 0.1 02/11/2012 1425   BASOSABS 0.0 02/11/2012 1425    BMET    Component Value Date/Time   NA 138 02/13/2012 0310   K 4.1 02/13/2012 0310   CL 104 02/13/2012 0310   CO2 23 02/13/2012 0310   GLUCOSE 119* 02/13/2012 0310   BUN 17 02/13/2012 0310   CREATININE 1.06 02/13/2012 0310   CALCIUM 8.5 02/13/2012 0310   GFRNONAA 46* 02/13/2012 0310   GFRAA 54* 02/13/2012 0310    CMP     Component Value Date/Time   NA 138 02/13/2012 0310   K 4.1 02/13/2012 0310   CL 104 02/13/2012 0310   CO2 23 02/13/2012 0310   GLUCOSE 119* 02/13/2012  0310   BUN 17 02/13/2012 0310   CREATININE 1.06 02/13/2012 0310   CALCIUM 8.5 02/13/2012 0310   PROT 6.6 02/11/2012 1425   ALBUMIN 2.8* 02/11/2012 1425   AST 25 02/11/2012 1425   ALT 23 02/11/2012 1425   ALKPHOS 90 02/11/2012 1425   BILITOT 0.2* 02/11/2012 1425   GFRNONAA 46* 02/13/2012 0310   GFRAA 54* 02/13/2012 0310    Chest Xray Reviewed/Impressions:02/11/12 IMPRESSION:  Probable bilateral scarring is noted. No acute cardiopulmonary  abnormality seen.    Time In Time Out Total Time Spent with Patient Total Overall Time  9:00a 10:00a 60 min 60 min    Greater than 50%  of this time was spent counseling and coordinating care related to  the above assessment and plan.  Freddie Breech, CNS-C Palliative Medicine Team [facility] Team Phone: [phone] Pager: [phone]

## 2012-02-13 NOTE — Progress Notes (Signed)
Patient ZO:XWRUEAV SOLA MARGOLIS      DOB: [birth year]      WUJ:[id number]  Met with patients this daughter this morning, we discussed patients history over past year, per daughter she has been declining. The Goals of Care daughter has decided on at this point are:  -Code Status: DNR/DNI  -Wants to continue limited interventions for any acute issues at this point.  -Would want to have mother brought back into the hospital for any acute issues for evaluation and treatment.   -Would want the use of IV fluids for defined period and the use of antibiotics for identified infection  -Want to continue treatment for PE intervention with anticoagulant therapy, has cons=cerns about using Coumadin due to mother having had a fall in the past.  -Would not want the use of artificial tube feedings for nutrition.  Full Palliative Note to follow.  Freddie Breech, CNS-C Palliative Medicine Team [facility] Health Team Phone: [phone] Pager: [phone]

## 2012-02-14 DIAGNOSIS — Z66 Do not resuscitate: Secondary | ICD-10-CM

## 2012-02-14 LAB — GLUCOSE, CAPILLARY: Glucose-Capillary: 169 mg/dL — ABNORMAL HIGH (ref 70–99)

## 2012-02-14 LAB — CBC
MCV: 83.1 fL (ref 78.0–100.0)
Platelets: 203 10*3/uL (ref 150–400)
RBC: 4.33 MIL/uL (ref 3.87–5.11)
WBC: 7.5 10*3/uL (ref 4.0–10.5)

## 2012-02-14 LAB — HEPARIN LEVEL (UNFRACTIONATED): Heparin Unfractionated: 0.32 IU/mL (ref 0.30–0.70)

## 2012-02-14 LAB — TROPONIN I
Troponin I: 1.08 ng/mL (ref <0.30)
Troponin I: 0.94 ng/mL (ref <0.30)

## 2012-02-14 MED ORDER — WARFARIN - PHARMACIST DOSING INPATIENT: Freq: Every day | Status: DC

## 2012-02-14 MED ORDER — ENOXAPARIN SODIUM 100 MG/ML ~~LOC~~ SOLN
85.0000 mg | Freq: Two times a day (BID) | SUBCUTANEOUS | Status: DC
  Administered 2012-02-14 – 2012-02-16 (×5): 85 mg via SUBCUTANEOUS
  Filled 2012-02-14 (×6): qty 1

## 2012-02-14 MED ORDER — METOPROLOL TARTRATE 25 MG PO TABS
25.0000 mg | ORAL_TABLET | Freq: Two times a day (BID) | ORAL | Status: DC
  Administered 2012-02-14 – 2012-02-16 (×5): 25 mg via ORAL
  Filled 2012-02-14 (×6): qty 1

## 2012-02-14 MED ORDER — WARFARIN SODIUM 5 MG PO TABS
5.0000 mg | ORAL_TABLET | Freq: Once | ORAL | Status: AC
  Administered 2012-02-14: 5 mg via ORAL
  Filled 2012-02-14: qty 1

## 2012-02-14 MED ORDER — COUMADIN BOOK
Freq: Once | Status: AC
  Administered 2012-02-14: 15:00:00
  Filled 2012-02-14: qty 1

## 2012-02-14 MED ORDER — ASPIRIN 81 MG PO CHEW
81.0000 mg | CHEWABLE_TABLET | Freq: Every day | ORAL | Status: DC
  Administered 2012-02-14 – 2012-02-16 (×3): 81 mg via ORAL
  Filled 2012-02-14 (×4): qty 1

## 2012-02-14 MED ORDER — WARFARIN VIDEO
Freq: Once | Status: AC
  Administered 2012-02-15: 13:00:00

## 2012-02-14 NOTE — Evaluation (Signed)
Physical Therapy Evaluation Patient Details Name: Desiree Gonzalez MRN: [id number] DOB: 1923-07-03 Today's Date: 02/14/2012 Time: 8413-2440 PT Time Calculation (min): 19 min  PT Assessment / Plan / Recommendation Clinical Impression  78 y.o. female with h/o advanced dementia and prior CVA admitted from SNF with B pulmonary embolisms.  Pt doing well with mobility. She walked 175' with RW with min A, SaO2 95% on RA, HR 100 with walking. REcommend return to SNF. She would benefit from acute PT to maximize safety and independence with mobility.     PT Assessment  Patient needs continued PT services    Follow Up Recommendations  SNF;Supervision for mobility/OOB    Does the patient have the potential to tolerate intense rehabilitation      Barriers to Discharge        Equipment Recommendations  Rolling walker with 5" wheels    Recommendations for Other Services     Frequency Min 3X/week    Precautions / Restrictions Precautions Precautions: None Restrictions Weight Bearing Restrictions: No   Pertinent Vitals/Pain *SaO2 95% on RA  And HR 100 with Walking No dyspnea noted, pt denies pain**      Mobility  Bed Mobility Bed Mobility: Supine to Sit Supine to Sit: 3: Mod assist Details for Bed Mobility Assistance: pt 65%, assist to elevate trunk and scoot to EOB Transfers Transfers: Sit to Stand;Stand to Sit Sit to Stand: With upper extremity assist;From bed;4: Min assist Stand to Sit: With upper extremity assist;To chair/3-in-1;4: Min guard Details for Transfer Assistance: min A to rise Ambulation/Gait Ambulation/Gait Assistance: 4: Min guard Ambulation Distance (Feet): 175 Feet Assistive device: Rolling walker Gait Pattern: Within Functional Limits General Gait Details: min A to maneuver RW during turns, no LOB    Shoulder Instructions     Exercises     PT Diagnosis: Generalized weakness  PT Problem List: Decreased mobility;Decreased cognition PT Treatment  Interventions: Gait training;DME instruction;Functional mobility training;Patient/family education;Therapeutic activities   PT Goals Acute Rehab PT Goals PT Goal Formulation: Patient unable to participate in goal setting Time For Goal Achievement: 02/28/12 Potential to Achieve Goals: Good Pt will go Supine/Side to Sit: with supervision;with HOB 0 degrees PT Goal: Supine/Side to Sit - Progress: Goal set today Pt will go Sit to Stand: with supervision PT Goal: Sit to Stand - Progress: Goal set today Pt will Ambulate: >150 feet;with supervision;with least restrictive assistive device PT Goal: Ambulate - Progress: Goal set today  Visit Information  Last PT Received On: 02/14/12 Assistance Needed: +1    Subjective Data  Subjective: It feels good to walk.  Patient Stated Goal: none stated   Prior Functioning  Home Living Available Help at Discharge: Skilled Nursing Facility Additional Comments: Pt unable to provide hx 2* advanced dementa, per chart she is from SNF. Pt stated she lives at home in [place].  Prior Function Comments: pt unable to provide this info 2* dementia, appears comfortable with a RW Communication Communication: No difficulties    Cognition  Overall Cognitive Status: History of cognitive impairments - at baseline Arousal/Alertness: Awake/alert Orientation Level: Place;Time;Situation Behavior During Session: [facility] for tasks performed Cognition - Other Comments: pt pleasantly confused, could not provide hx, but can follow commands    Extremity/Trunk Assessment Right Upper Extremity Assessment RUE ROM/Strength/Tone: [facility] for tasks assessed Left Upper Extremity Assessment LUE ROM/Strength/Tone: WFL for tasks assessed Right Lower Extremity Assessment RLE ROM/Strength/Tone: Within functional levels RLE Sensation: WFL - Light Touch RLE Coordination: WFL - gross/fine motor Left Lower Extremity Assessment  LLE ROM/Strength/Tone: Within functional levels LLE Sensation: WFL  - Light Touch LLE Coordination: WFL - gross/fine motor Trunk Assessment Trunk Assessment: Normal   Balance    End of Session PT - End of Session Equipment Utilized During Treatment: Gait belt Activity Tolerance: Patient tolerated treatment well Patient left: in chair;with call bell/phone within reach Nurse Communication: Mobility status  GP     Desiree Gonzalez 02/14/2012, 8:45 AM  [phone]

## 2012-02-14 NOTE — Progress Notes (Signed)
[facility]                                                                                Patient Demographics  Desiree Gonzalez, is a 78 y.o. female  ZOX:[id number]  [id number]:[id number]  DOB - [birth year]  Admit date - 02/11/2012  Admitting Physician Joseph Art, DO  Outpatient Primary MD for the patient is No primary provider on file.  LOS - 3   Chief Complaint  Patient presents with  . Nausea  . Fatigue        Assessment & Plan    Principal Problem:  *Pulmonary emboli Active Problems:  Urinary tract infection  Hypoxia  Dementia  DM (diabetes mellitus)  HTN (hypertension)  DNR (do not resuscitate)  Hypokalemia  Pain in shoulder  Failure to thrive   Brief narrative:  78 yo female with advanced dementia, DNR code status, NH resident, hx of HTN, prior CVA, DM, brought into the [facility] urgent care because of weakness. She didn't know where she was nor why she was there. She offered no complaints.  She was noted to have slight tachycardia, and a CTA of chest was obtained which showed bilateral pulmonary emboli with moderate clot burden.    Assessment/Plan:   Pulmonary emboli  We'll switch to Lovenox and Coumadin overlap to be monitored by pharmacy, she is symptom-free currently, no chest pain or shortness of breath due to her moderate clot burden she had elevated troponin c/w initial right heart strain due to clot burden , echo does not show any regional wall motion abnormalities, EKG is nonacute changes, she is chest pain pressure or symptom-free. We'll add low-dose aspirin and beta blocker for now, we'll trend troponin to document stabilization.    E coli Urinary tract infection  Cont empiric coverage - f/u sensitivity on culture when available -  foley cath as been removed.    Hypoxia  Not requiring any oxygen currently, hypoxia initially was due to PE, monitor her symptoms.    Dementia  Quite severe memory deficit which apparently has  worsened rapidly in the past 2 months.  Full fall precautions and activity with assistance.    DM  Reasonably controlled at this time continue present care. Which include sliding scale insulin  CBG (last 3)   Basename 02/14/12 0803 02/13/12 2252 02/13/12 1714  GLUCAP 129* 138* 109*      HTN  Will monitor on low-dose Lopressor, since by mouth intake is poor and she is demented agree with discontinuing of ACE inhibitor and HCTZ.    Code Status: DNR   Family Communication: PC Team meeting with family   Disposition Plan: transfer to medical bed while family deciding on GOC   Consultants:  Palliative Care Team   Procedures:  12/12 - TTE - preserved LV systolic fxn - Grade I diastolic dysfxn        DVT Prophylaxis  Lovenox - Coumadin  Lab Results  Component Value Date   INR 1.13 02/11/2012   INR 0.97 02/20/2010   INR 0.99 02/20/2010     Lab Results  Component Value Date   PLT 203 02/14/2012    Medications  Scheduled Meds:   .  amLODipine  10 mg Oral Daily  . cefTRIAXone (ROCEPHIN)  IV  1 g Intravenous Q24H  . cholecalciferol  400 Units Oral Daily  . coumadin book   Does not apply Once  . docusate sodium  100 mg Oral Daily  . donepezil  10 mg Oral QHS  . enoxaparin (LOVENOX) injection  85 mg Subcutaneous Q12H  . insulin aspart  0-15 Units Subcutaneous TID WC  . insulin aspart  0-5 Units Subcutaneous QHS  . memantine  10 mg Oral BID  . multivitamin with minerals  1 tablet Oral q morning - 10a  . pantoprazole  40 mg Oral Daily  . sodium chloride  3 mL Intravenous Q12H  . warfarin  5 mg Oral ONCE-1800  . warfarin   Does not apply Once  . Warfarin - Pharmacist Dosing Inpatient   Does not apply q1800   Continuous Infusions:  PRN Meds:.acetaminophen, morphine injection, ondansetron (ZOFRAN) IV, oxyCODONE  Antibiotics     Anti-infectives     Start     Dose/Rate Route Frequency Ordered Stop   02/11/12 2200   cefTRIAXone (ROCEPHIN) 1 g in dextrose 5 % 50  mL IVPB        1 g 100 mL/hr over 30 Minutes Intravenous Every 24 hours 02/11/12 2048 02/16/12 2159   02/11/12 1630   cefTRIAXone (ROCEPHIN) 1 g in dextrose 5 % 50 mL IVPB        1 g 100 mL/hr over 30 Minutes Intravenous  Once 02/11/12 1627 02/11/12 1742           Time Spent in minutes   35   Taneisha Fuson K M.D on 02/14/2012 at 11:21 AM  Between 7am to 7pm - Pager - [phone]  After 7pm go to [email or web address] - password [email or web address]  And look for the night coverage person covering for me after hours  [facility] Office  [phone]    Subjective:   Desiree Gonzalez today has, No headache, No chest pain, No abdominal pain - No Nausea, No new weakness tingling or numbness, No Cough - SOB.   Objective:   Filed Vitals:   02/13/12 2253 02/14/12 0550 02/14/12 0837 02/14/12 1008  BP: 162/87 164/91  145/74  Pulse: 85 82 100   Temp: 98.4 F (36.9 C) 97.6 F (36.4 C)    TempSrc: Oral Oral    Resp: 16 17    Height:      Weight:      SpO2: 93% 93% 95%     Wt Readings from Last 3 Encounters:  02/11/12 86.2 kg (190 lb 0.6 oz)     Intake/Output Summary (Last 24 hours) at 02/14/12 1121 Last data filed at 02/14/12 1010  Gross per 24 hour  Intake 348.63 ml  Output    300 ml  Net  48.63 ml    Exam Awake mildly confused, No new F.N deficits, Normal affect [place].AT,PERRAL Supple Neck,No JVD, No cervical lymphadenopathy appriciated.  Symmetrical Chest wall movement, Good air movement bilaterally, CTAB RRR,No Gallops,Rubs or new Murmurs, No Parasternal Heave +ve B.Sounds, Abd Soft, Non tender, No organomegaly appriciated, No rebound - guarding or rigidity. No Cyanosis, Clubbing or edema, No new Rash or bruise     Data Review   Micro Results Recent Results (from the past 240 hour(s))  URINE CULTURE     Status: Normal   Collection Time   02/11/12  2:35 PM      Component Value Range Status Comment   Specimen Description URINE, CATHETERIZED  Final    Special  Requests NONE   Final    Culture  Setup Time 02/12/2012 01:24   Final    Colony Count >=100,000 COLONIES/ML   Final    Culture ESCHERICHIA COLI   Final    Report Status 02/13/2012 FINAL   Final    Organism ID, Bacteria ESCHERICHIA COLI   Final   MRSA PCR SCREENING     Status: Normal   Collection Time   02/11/12  7:28 PM      Component Value Range Status Comment   MRSA by PCR NEGATIVE  NEGATIVE Final     Radiology Reports Dg Chest 2 View  02/11/2012  *RADIOLOGY REPORT*  Clinical Data: Nausea, fatigue.  CHEST - 2 VIEW  Comparison: February 20, 2010.  Findings: Cardiomediastinal silhouette appears normal.  Old compression fracture of mid thoracic vertebral body is noted.  Mild interstitial densities are noted throughout both lungs most consistent with scarring.  No acute pulmonary disease is noted.  IMPRESSION: Probable bilateral scarring is noted.  No acute cardiopulmonary abnormality seen.   Original Report Authenticated By: Lupita Raider.,  M.D.    Ct Angio Chest Pe W/cm &/or Wo Cm  02/11/2012  *RADIOLOGY REPORT*  Clinical Data: Hypoxia short of breath  CT ANGIOGRAPHY CHEST  Technique:  Multidetector CT imaging of the chest using the standard protocol during bolus administration of intravenous contrast. Multiplanar reconstructed images including MIPs were obtained and reviewed to evaluate the vascular anatomy.  Contrast: OMNIPAQUE IOHEXOL 350 MG/ML SOLN  Comparison: Chest x-ray from today  Findings: Pulmonary embolism is present bilaterally.  Upper and lower lobe pulmonary arteries show filling defect bilaterally. Main pulmonary artery is normal.  Negative for pericardial effusion.  Heart size is normal.  Lungs are clear without infiltrate or effusion.  No mass or adenopathy. Small hiatal hernia.  Severe compression fracture of  lower thoracic spine is chronic and unchanged from prior studies.  IMPRESSION: Bilateral pulmonary emboli with moderate clot burden.  I discussed the findings by  telephone with Dr. Manus Gunning   Original Report Authenticated By: Janeece Riggers, M.D.     CBC  Lab 02/14/12 0620 02/13/12 0310 02/12/12 0817 02/11/12 1425  WBC 7.5 8.1 8.6 12.3*  HGB 11.9* 11.2* 11.2* 11.6*  HCT 36.0 33.8* 34.2* 34.8*  PLT 203 198 187 191  MCV 83.1 84.5 84.4 84.3  MCH 27.5 28.0 27.7 28.1  MCHC 33.1 33.1 32.7 33.3  RDW 12.6 12.9 12.6 12.4  LYMPHSABS -- -- -- 1.3  MONOABS -- -- -- 0.8  EOSABS -- -- -- 0.1  BASOSABS -- -- -- 0.0  BANDABS -- -- -- --    Chemistries   Lab 02/13/12 0310 02/12/12 0817 02/11/12 1425  NA 138 139 137  K 4.1 3.2* 3.1*  CL 104 102 100  CO2 23 26 23   GLUCOSE 119* 139* 182*  BUN 17 14 16   CREATININE 1.06 0.89 1.00  CALCIUM 8.5 8.4 8.5  MG -- -- --  AST -- -- 25  ALT -- -- 23  ALKPHOS -- -- 90  BILITOT -- -- 0.2*   ------------------------------------------------------------------------------------------------------------------ estimated creatinine clearance is 42.9 ml/min (by C-G formula based on Cr of 1.06). ------------------------------------------------------------------------------------------------------------------ No results found for this basename: HGBA1C:2 in the last 72 hours ------------------------------------------------------------------------------------------------------------------ No results found for this basename: CHOL:2,HDL:2,LDLCALC:2,TRIG:2,CHOLHDL:2,LDLDIRECT:2 in the last 72 hours ------------------------------------------------------------------------------------------------------------------ No results found for this basename: TSH,T4TOTAL,FREET3,T3FREE,THYROIDAB in the last 72 hours ------------------------------------------------------------------------------------------------------------------ No results found for this basename: VITAMINB12:2,FOLATE:2,FERRITIN:2,TIBC:2,IRON:2,RETICCTPCT:2 in the last  72 hours  Coagulation profile  Lab 02/11/12 2119  INR 1.13  PROTIME --     Basename 02/11/12 1425   DDIMER 6.10*    Cardiac Enzymes  Lab 02/12/12 1411 02/11/12 1425  CKMB 3.7 --  TROPONINI 3.05* <0.30  MYOGLOBIN -- --   ------------------------------------------------------------------------------------------------------------------ No components found with this basename: POCBNP:3

## 2012-02-14 NOTE — Progress Notes (Signed)
ANTICOAGULATION CONSULT NOTE - Initial Consult  Pharmacy Consult for Lovenox and Coumadin Indication: pulmonary embolus  Labs:  Basename 02/14/12 0620 02/13/12 2112 02/13/12 0947 02/13/12 0310 02/12/12 1411 02/12/12 0817 02/11/12 2119 02/11/12 1425  HGB 11.9* -- -- 11.2* -- -- -- --  HCT 36.0 -- -- 33.8* -- 34.2* -- --  PLT 203 -- -- 198 -- 187 -- --  APTT -- -- -- -- -- -- 89* --  LABPROT -- -- -- -- -- -- 14.3 --  INR -- -- -- -- -- -- 1.13 --  HEPARINUNFRC 0.32 0.31 0.25* -- -- -- -- --  CREATININE -- -- -- 1.06 -- 0.89 -- 1.00  CKTOTAL -- -- -- -- 27 -- -- --  CKMB -- -- -- -- 3.7 -- -- --  TROPONINI -- -- -- -- 3.05* -- -- <0.30    Assessment: 78 y/o female patient receiving heparin for b/l PE (moderate clot burden). Heparin level therapeutic this a.m. Md order to change heparin to lovenox and start coumadin. Baseline INR 1.13. CBC stable. No bleeding noted. This will be Day #1/5 minimum overlap. Noted palliative care note. Pt is from NH and has baseline dementia.  Goal of Therapy:  INR 2-3, anti-Xa level 0.6-1.2 4 hours post dose   Plan:  1) D/c heparin gtt 2) Begin lovenox 85mg  SQ q12h. First dose one hour after heparin gtt stopped 3) Daily INR 4) Coumadin 5 mg po tonight 5) Coumadin educational booklet and video - noted pt confused so will need to speak with daughter  Christoper Fabian, PharmD, BCPS Clinical pharmacist, pager [phone] 02/14/2012,10:26 AM

## 2012-02-15 LAB — PROTIME-INR
INR: 1.13 (ref 0.00–1.49)
Prothrombin Time: 14.3 seconds (ref 11.6–15.2)

## 2012-02-15 LAB — GLUCOSE, CAPILLARY: Glucose-Capillary: 112 mg/dL — ABNORMAL HIGH (ref 70–99)

## 2012-02-15 MED ORDER — WARFARIN SODIUM 5 MG PO TABS
5.0000 mg | ORAL_TABLET | Freq: Once | ORAL | Status: AC
  Administered 2012-02-15: 5 mg via ORAL
  Filled 2012-02-15: qty 1

## 2012-02-15 NOTE — Progress Notes (Signed)
ANTICOAGULATION CONSULT NOTE - Follow-Up Consult  Pharmacy Consult for Lovenox and Coumadin Indication: pulmonary embolus  Labs:  Basename 02/15/12 0730 02/14/12 2155 02/14/12 1632 02/14/12 0620 02/13/12 2112 02/13/12 0947 02/13/12 0310 02/12/12 1411  HGB -- -- -- 11.9* -- -- 11.2* --  HCT -- -- -- 36.0 -- -- 33.8* --  PLT -- -- -- 203 -- -- 198 --  APTT -- -- -- -- -- -- -- --  LABPROT 14.3 -- -- -- -- -- -- --  INR 1.13 -- -- -- -- -- -- --  HEPARINUNFRC -- -- -- 0.32 0.31 0.25* -- --  CREATININE -- -- -- -- -- -- 1.06 --  CKTOTAL -- -- -- -- -- -- -- 27  CKMB -- -- -- -- -- -- -- 3.7  TROPONINI -- 0.94* 1.08* -- -- -- -- 3.05*    Assessment: 78 y/o female patient receiving Lovenox/Coumadin for b/l PE (moderate clot burden).  INR 1.13 - no movement past first dose of coumadin. CBC stable. No bleeding noted. This is Day #2/5 minimum overlap. Noted palliative care recs. Pt is from NH and has baseline dementia.  Goal of Therapy:  INR 2-3, anti-Xa level 0.6-1.2 4 hours post dose   Plan:  1) Continue Lovenox 85mg  SQ q12h.  2) Daily INR 3) Coumadin 5 mg po again tonight  Christoper Fabian, PharmD, BCPS Clinical pharmacist, pager [phone] 02/15/2012,9:48 AM

## 2012-02-15 NOTE — Progress Notes (Addendum)
[facility]                                                                                Patient Demographics  Desiree Gonzalez, is a 78 y.o. female  NFA:[id number]  ION:[id number]  DOB - 1923/09/13  Admit date - 02/11/2012  Admitting Physician Joseph Art, DO  Outpatient Primary MD for the patient is No primary provider on file.  LOS - 4   Chief Complaint  Patient presents with  . Nausea  . Fatigue        Assessment & Plan    Brief narrative:  78 yo female with advanced dementia, DNR code status, NH resident, hx of HTN, prior CVA, DM, brought into the [facility] urgent care because of weakness. She didn't know where she was nor why she was there. She offered no complaints. She was noted to have slight tachycardia, and a CTA of chest was obtained which showed bilateral pulmonary emboli with moderate clot burden.    Assessment/Plan:   Pulmonary emboli  We'll switch to Lovenox and Coumadin overlap to be monitored by pharmacy, she is symptom-free currently, no chest pain or shortness of breath due to her moderate clot burden she had elevated troponin c/w initial right heart strain due to clot burden , echo does not show any regional wall motion abnormalities, EKG is nonacute changes, she is chest pain pressure or symptom-free. We'll add low-dose aspirin and beta blocker for now, non-ACS pattern troponin trend. Outpatient cardiology followup post discharge.    E coli Urinary tract infection  Cont empiric coverage - f/u sensitivity on culture when available -  foley cath as been removed.    Hypoxia  Not requiring any oxygen currently, hypoxia initially was due to PE, monitor her symptoms.    Dementia  Quite severe memory deficit which apparently has worsened rapidly in the past 2 months.  Full fall precautions and activity with assistance.    DM  Reasonably controlled at this time continue present care. Which include sliding scale insulin  CBG (last  3)   Basename 02/14/12 2139 02/14/12 1715 02/14/12 1218  GLUCAP 141* 169* 149*      HTN  Will monitor on low-dose Lopressor, since by mouth intake is poor and she is demented agree with discontinuing of ACE inhibitor and HCTZ.    Code Status: DNR   Family Communication: PC Team meeting with family , I D/W her daughter Elson Clan on 02-15-12, [place] for SNF.  Disposition Plan: transfer to medical bed while family deciding on GOC   Consultants: Palliative Care Team   Procedures: 12/12 - TTE - preserved LV systolic fxn - Grade I diastolic dysfxn        DVT Prophylaxis  Lovenox - Coumadin  Lab Results  Component Value Date   INR 1.13 02/15/2012   INR 1.13 02/11/2012   INR 0.97 02/20/2010     Lab Results  Component Value Date   PLT 203 02/14/2012    Medications  Scheduled Meds:    . aspirin  81 mg Oral Daily  . cefTRIAXone (ROCEPHIN)  IV  1 g Intravenous Q24H  . cholecalciferol  400 Units Oral Daily  .  docusate sodium  100 mg Oral Daily  . donepezil  10 mg Oral QHS  . enoxaparin (LOVENOX) injection  85 mg Subcutaneous Q12H  . insulin aspart  0-15 Units Subcutaneous TID WC  . insulin aspart  0-5 Units Subcutaneous QHS  . memantine  10 mg Oral BID  . metoprolol tartrate  25 mg Oral BID  . multivitamin with minerals  1 tablet Oral q morning - 10a  . pantoprazole  40 mg Oral Daily  . sodium chloride  3 mL Intravenous Q12H  . warfarin  5 mg Oral ONCE-1800  . warfarin   Does not apply Once  . Warfarin - Pharmacist Dosing Inpatient   Does not apply q1800   Continuous Infusions:  PRN Meds:.acetaminophen, morphine injection, ondansetron (ZOFRAN) IV, oxyCODONE  Antibiotics     Anti-infectives     Start     Dose/Rate Route Frequency Ordered Stop   02/11/12 2200   cefTRIAXone (ROCEPHIN) 1 g in dextrose 5 % 50 mL IVPB        1 g 100 mL/hr over 30 Minutes Intravenous Every 24 hours 02/11/12 2048 02/16/12 2159   02/11/12 1630   cefTRIAXone (ROCEPHIN) 1 g in dextrose 5 %  50 mL IVPB        1 g 100 mL/hr over 30 Minutes Intravenous  Once 02/11/12 1627 02/11/12 1742           Time Spent in minutes   35   Meshulem Onorato K M.D on 02/15/2012 at 9:59 AM  Between 7am to 7pm - Pager - [phone]  After 7pm go to [email or web address] - password [email or web address]  And look for the night coverage person covering for me after hours  [facility] Office  [phone]    Subjective:   Desiree Gonzalez today has, No headache, No chest pain, No abdominal pain - No Nausea, No new weakness tingling or numbness, No Cough - SOB.   Objective:   Filed Vitals:   02/14/12 1450 02/14/12 1454 02/14/12 2141 02/15/12 0524  BP: 150/70 150/70 144/69 155/80  Pulse: 95 95 80 90  Temp: 97.2 F (36.2 C)  97.6 F (36.4 C) 97.7 F (36.5 C)  TempSrc: Oral  Oral Oral  Resp: 18  18 18   Height:      Weight:      SpO2: 95%  99% 97%    Wt Readings from Last 3 Encounters:  02/11/12 86.2 kg (190 lb 0.6 oz)     Intake/Output Summary (Last 24 hours) at 02/15/12 0959 Last data filed at 02/15/12 0524  Gross per 24 hour  Intake    243 ml  Output      2 ml  Net    241 ml    Exam Awake mildly confused, No new F.N deficits, Normal affect [place].AT,PERRAL Supple Neck,No JVD, No cervical lymphadenopathy appriciated.  Symmetrical Chest wall movement, Good air movement bilaterally, CTAB RRR,No Gallops,Rubs or new Murmurs, No Parasternal Heave +ve B.Sounds, Abd Soft, Non tender, No organomegaly appriciated, No rebound - guarding or rigidity. No Cyanosis, Clubbing or edema, No new Rash or bruise     Data Review   Micro Results Recent Results (from the past 240 hour(s))  URINE CULTURE     Status: Normal   Collection Time   02/11/12  2:35 PM      Component Value Range Status Comment   Specimen Description URINE, CATHETERIZED   Final    Special Requests NONE   Final    Culture  Setup  Time 02/12/2012 01:24   Final    Colony Count >=100,000 COLONIES/ML   Final    Culture  ESCHERICHIA COLI   Final    Report Status 02/13/2012 FINAL   Final    Organism ID, Bacteria ESCHERICHIA COLI   Final   MRSA PCR SCREENING     Status: Normal   Collection Time   02/11/12  7:28 PM      Component Value Range Status Comment   MRSA by PCR NEGATIVE  NEGATIVE Final     Radiology Reports Dg Chest 2 View  02/11/2012  *RADIOLOGY REPORT*  Clinical Data: Nausea, fatigue.  CHEST - 2 VIEW  Comparison: February 20, 2010.  Findings: Cardiomediastinal silhouette appears normal.  Old compression fracture of mid thoracic vertebral body is noted.  Mild interstitial densities are noted throughout both lungs most consistent with scarring.  No acute pulmonary disease is noted.  IMPRESSION: Probable bilateral scarring is noted.  No acute cardiopulmonary abnormality seen.   Original Report Authenticated By: Lupita Raider.,  M.D.    Ct Angio Chest Pe W/cm &/or Wo Cm  02/11/2012  *RADIOLOGY REPORT*  Clinical Data: Hypoxia short of breath  CT ANGIOGRAPHY CHEST  Technique:  Multidetector CT imaging of the chest using the standard protocol during bolus administration of intravenous contrast. Multiplanar reconstructed images including MIPs were obtained and reviewed to evaluate the vascular anatomy.  Contrast: OMNIPAQUE IOHEXOL 350 MG/ML SOLN  Comparison: Chest x-ray from today  Findings: Pulmonary embolism is present bilaterally.  Upper and lower lobe pulmonary arteries show filling defect bilaterally. Main pulmonary artery is normal.  Negative for pericardial effusion.  Heart size is normal.  Lungs are clear without infiltrate or effusion.  No mass or adenopathy. Small hiatal hernia.  Severe compression fracture of  lower thoracic spine is chronic and unchanged from prior studies.  IMPRESSION: Bilateral pulmonary emboli with moderate clot burden.  I discussed the findings by telephone with Dr. Manus Gunning   Original Report Authenticated By: Janeece Riggers, M.D.     CBC  Lab 02/14/12 0620 02/13/12 0310  02/12/12 0817 02/11/12 1425  WBC 7.5 8.1 8.6 12.3*  HGB 11.9* 11.2* 11.2* 11.6*  HCT 36.0 33.8* 34.2* 34.8*  PLT 203 198 187 191  MCV 83.1 84.5 84.4 84.3  MCH 27.5 28.0 27.7 28.1  MCHC 33.1 33.1 32.7 33.3  RDW 12.6 12.9 12.6 12.4  LYMPHSABS -- -- -- 1.3  MONOABS -- -- -- 0.8  EOSABS -- -- -- 0.1  BASOSABS -- -- -- 0.0  BANDABS -- -- -- --    Chemistries   Lab 02/13/12 0310 02/12/12 0817 02/11/12 1425  NA 138 139 137  K 4.1 3.2* 3.1*  CL 104 102 100  CO2 23 26 23   GLUCOSE 119* 139* 182*  BUN 17 14 16   CREATININE 1.06 0.89 1.00  CALCIUM 8.5 8.4 8.5  MG -- -- --  AST -- -- 25  ALT -- -- 23  ALKPHOS -- -- 90  BILITOT -- -- 0.2*   ------------------------------------------------------------------------------------------------------------------ estimated creatinine clearance is 42.9 ml/min (by C-G formula based on Cr of 1.06). ------------------------------------------------------------------------------------------------------------------ No results found for this basename: HGBA1C:2 in the last 72 hours ------------------------------------------------------------------------------------------------------------------ No results found for this basename: CHOL:2,HDL:2,LDLCALC:2,TRIG:2,CHOLHDL:2,LDLDIRECT:2 in the last 72 hours ------------------------------------------------------------------------------------------------------------------ No results found for this basename: TSH,T4TOTAL,FREET3,T3FREE,THYROIDAB in the last 72 hours ------------------------------------------------------------------------------------------------------------------ No results found for this basename: VITAMINB12:2,FOLATE:2,FERRITIN:2,TIBC:2,IRON:2,RETICCTPCT:2 in the last 72 hours  Coagulation profile  Lab 02/15/12 0730 02/11/12 2119  INR 1.13 1.13  PROTIME -- --    No results found for this basename: DDIMER:2 in the last 72 hours  Cardiac Enzymes  Lab 02/14/12 2155 02/14/12 1632 02/12/12 1411   CKMB -- -- 3.7  TROPONINI 0.94* 1.08* 3.05*  MYOGLOBIN -- -- --   ------------------------------------------------------------------------------------------------------------------ No components found with this basename: POCBNP:3

## 2012-02-16 LAB — PROTIME-INR: INR: 1.14 (ref 0.00–1.49)

## 2012-02-16 LAB — GLUCOSE, CAPILLARY
Glucose-Capillary: 136 mg/dL — ABNORMAL HIGH (ref 70–99)
Glucose-Capillary: 124 mg/dL — ABNORMAL HIGH (ref 70–99)

## 2012-02-16 MED ORDER — METOPROLOL TARTRATE 25 MG PO TABS: 25.0000 mg | ORAL_TABLET | Freq: Two times a day (BID) | ORAL | Status: DC

## 2012-02-16 MED ORDER — ENOXAPARIN SODIUM 100 MG/ML ~~LOC~~ SOLN: 85.0000 mg | Freq: Two times a day (BID) | SUBCUTANEOUS | Status: DC

## 2012-02-16 MED ORDER — WARFARIN SODIUM 5 MG PO TABS: 5.0000 mg | ORAL_TABLET | Freq: Every day | ORAL | Status: DC

## 2012-02-16 NOTE — Progress Notes (Signed)
ANTICOAGULATION CONSULT NOTE - Follow-Up Consult  Pharmacy Consult for Lovenox and Coumadin Indication: pulmonary embolus  Labs:  Basename 02/16/12 0704 02/15/12 0730 02/14/12 2155 02/14/12 1632 02/14/12 0620 02/13/12 2112  HGB -- -- -- -- 11.9* --  HCT -- -- -- -- 36.0 --  PLT -- -- -- -- 203 --  APTT -- -- -- -- -- --  LABPROT 14.4 14.3 -- -- -- --  INR 1.14 1.13 -- -- -- --  HEPARINUNFRC -- -- -- -- 0.32 0.31  CREATININE -- -- -- -- -- --  CKTOTAL -- -- -- -- -- --  CKMB -- -- -- -- -- --  TROPONINI -- -- 0.94* 1.08* -- --    Assessment: 78 y/o female patient receiving Lovenox/Coumadin for b/l PE (moderate clot burden).  INR 1.14 - no movement after 2 doses of 5 mg of coumadin. CBC stable. No bleeding noted. This is Day #3/5 minimum overlap.  Pt is from NH and has baseline dementia. She has orders to DC to SNF and has orders for LMWH 85 sq q12 hr until INR > 2 for 48 hours and coumadin 5 mg daily, check INR qod x 2 weeks.   Goal of Therapy:  INR 2-3, anti-Xa level 0.6-1.2 4 hours post dose   Plan:  She has discharge orders written as follows:  1) Lovenox 85mg  SQ q12h. - continue until INR > 2 x 48 hours  3) Coumadin 5 mg po daily, check INR every 2 days for 2 weeks, goal INR 2-3 Herby Abraham, Pharm.D. [phone] 02/16/2012 1:30 PM

## 2012-02-16 NOTE — Evaluation (Signed)
Occupational Therapy Evaluation Patient Details Name: Desiree Gonzalez MRN: [id number] DOB: Sep 15, 1923 Today's Date: 02/16/2012 Time: 4098-1191 OT Time Calculation (min): 26 min  OT Assessment / Plan / Recommendation Clinical Impression  Pt is a 78 yr old female admitted with bilateral PE and UTI. Now presents with increased dependence with basic selfcare tasks and functional transfers.  Will benefit from acute care OT to help address these deficits in order to increase overall independence.  Feel pt will need 24 hour supervision/assistance at discharge, which she can receive at SNF level..  Recommend continued OT at SNF.    OT Assessment  Patient needs continued OT Services    Follow Up Recommendations  SNF;Supervision/Assistance - 24 hour    Barriers to Discharge None    Equipment Recommendations  None recommended by OT       Frequency  Min 2X/week    Precautions / Restrictions Precautions Precautions: Fall Restrictions Weight Bearing Restrictions: No   Pertinent Vitals/Pain Vitals stable    ADL  Eating/Feeding: Simulated Where Assessed - Eating/Feeding: Edge of bed Grooming: Performed;Minimal assistance Where Assessed - Grooming: Supported standing Upper Body Bathing: Simulated;Set up Where Assessed - Upper Body Bathing: Unsupported sitting Lower Body Bathing: Simulated;Moderate assistance Where Assessed - Lower Body Bathing: Supported sit to stand Upper Body Dressing: Simulated;Set up Where Assessed - Upper Body Dressing: Unsupported sitting Lower Body Dressing: Simulated;Maximal assistance Where Assessed - Lower Body Dressing: Unsupported sit to stand Toilet Transfer: Simulated;Minimal assistance Toilet Transfer Method: Stand pivot Toilet Transfer Equipment: Comfort height toilet;Grab bars Toileting - Clothing Manipulation and Hygiene: Simulated;Minimal assistance Where Assessed - Engineer, mining and Hygiene: Sit to stand from 3-in-1 or  toilet Tub/Shower Transfer Method: Not assessed Transfers/Ambulation Related to ADLs: Pt required min assist hand held assist for transfer to the bathroom. ADL Comments: Pt with decreased ability to reach either foot for donning her gripper socks  Required constant min assist for balance while standing and ambulating to the sink to complete grooming task.    OT Diagnosis: Generalized weakness  OT Problem List: Decreased strength;Impaired balance (sitting and/or standing);Decreased knowledge of use of DME or AE;Decreased cognition OT Treatment Interventions: Self-care/ADL training;Therapeutic activities;DME and/or AE instruction;Patient/family education;Balance training   OT Goals Acute Rehab OT Goals OT Goal Formulation: Patient unable to participate in goal setting Time For Goal Achievement: 03/01/12 Potential to Achieve Goals: Fair ADL Goals Pt Will Perform Grooming: with supervision;Supported;Standing at sink ADL Goal: Grooming - Progress: Goal set today Pt Will Perform Lower Body Bathing: with supervision;Sit to stand from bed;with adaptive equipment ADL Goal: Lower Body Bathing - Progress: Goal set today Pt Will Perform Lower Body Dressing: Sit to stand from chair;Supported;with adaptive equipment;with min assist ADL Goal: Lower Body Dressing - Progress: Goal set today Pt Will Transfer to Toilet: with supervision;with DME;3-in-1;Ambulation ADL Goal: Toilet Transfer - Progress: Goal set today Pt Will Perform Toileting - Clothing Manipulation: with supervision;Sitting on 3-in-1 or toilet;Standing ADL Goal: Toileting - Clothing Manipulation - Progress: Goal set today Pt Will Perform Toileting - Hygiene: with supervision;Sit to stand from 3-in-1/toilet ADL Goal: Toileting - Hygiene - Progress: Goal set today  Visit Information  Last OT Received On: 02/16/12 Assistance Needed: +1    Subjective Data  Subjective: I can try that. Patient Stated Goal: Unable to state during this  session.   Prior Functioning     Home Living Available Help at Discharge: Skilled Nursing Facility Additional Comments: Pt unable to provide hx 2* advanced dementa, per chart she is  from SNF. Pt stated she lives at home in [place].  Communication Communication: No difficulties         Vision/Perception Vision - Assessment Eye Alignment: Within Functional Limits Vision Assessment: Vision not tested Perception Perception: Within Functional Limits Praxis Praxis: Intact   Cognition  Overall Cognitive Status: History of cognitive impairments - at baseline Arousal/Alertness: Awake/alert Orientation Level: Disoriented to;Place;Time;Situation Behavior During Session: [facility] for tasks performed    Extremity/Trunk Assessment Right Upper Extremity Assessment RUE ROM/Strength/Tone: Within functional levels RUE Sensation: WFL - Light Touch RUE Coordination: WFL - gross/fine motor Left Upper Extremity Assessment LUE ROM/Strength/Tone: Within functional levels LUE Sensation: WFL - Light Touch LUE Coordination: WFL - gross/fine motor Trunk Assessment Trunk Assessment: Normal     Mobility Bed Mobility Bed Mobility: Supine to Sit Supine to Sit: 4: Min assist;HOB elevated Transfers Transfers: Sit to Stand Sit to Stand: 4: Min assist;Without upper extremity assist;With armrests;From toilet Stand to Sit: 4: Min assist;With armrests           Balance Balance Balance Assessed: Yes Dynamic Standing Balance Dynamic Standing - Balance Support: Left upper extremity supported Dynamic Standing - Level of Assistance: 4: Min assist   End of Session OT - End of Session Equipment Utilized During Treatment: Gait belt Activity Tolerance: Patient tolerated treatment well Patient left: in chair;with call bell/phone within reach Nurse Communication: Mobility status     Kina Shiffman OTR/L Pager number [phone] 02/16/2012, 1:01 PM

## 2012-02-16 NOTE — Progress Notes (Signed)
Pt d/c'd back to ALF with care arrangements made between facility and SW.  Pt has dementia and unable to comprehend d/c instructions. ALF memory care unit at [facility] will be caring for pt and meet her needs of care. Copy of instructions and scripts were included in pt's d/c packet taken with pt by transporters. Daughter not here at time of discharge, copy of d/c instructions included in packet for daughter.  Pt d/c'd with belongings with [facility].

## 2012-02-16 NOTE — Discharge Summary (Signed)
[facility]                                                                                   Desiree Gonzalez, is a 78 y.o. female  DOB 05/25/1923  MRN [id number].  Admission date:  02/11/2012  Discharge Date:  02/16/2012  Primary MD  No primary provider on file.  Admitting Physician  Joseph Art, DO  Admission Diagnosis  Urinary tract infection [599.0] Hypoxia [799.02] Pulmonary emboli [415.19] Nausea/Fatigue  Discharge Diagnosis     Principal Problem:  *Pulmonary emboli Active Problems:  Urinary tract infection  Hypoxia  Dementia  DM (diabetes mellitus)  HTN (hypertension)  DNR (do not resuscitate)  Hypokalemia  Pain in shoulder  Failure to thrive    Past Medical History  Diagnosis Date  . CVA (cerebral infarction)   . Alzheimer's dementia   . GERD (gastroesophageal reflux disease)   . Hypertension   . Osteopenia   . Diabetes mellitus without complication     History reviewed. No pertinent past surgical history.   Discharge Diagnoses:   Principal Problem:  *Pulmonary emboli Active Problems:  Urinary tract infection  Hypoxia  Dementia  DM (diabetes mellitus)  HTN (hypertension)  DNR (do not resuscitate)  Hypokalemia  Pain in shoulder  Failure to thrive    Discharge Condition: stable  Diet recommendation: See Discharge Instructions below   Consults Cardiology over phone  History of present illness and  Hospital Course:  See H&P, Labs, Consult and Test reports for all details in brief,    Brief narrative:  78 yo female with advanced dementia, DNR code status, NH resident, hx of HTN, prior CVA, DM, brought into the [facility] urgent care because of weakness. She didn't know where she was nor why she was there. She offered no complaints. She was noted to have slight tachycardia, and a CTA of chest was obtained which showed bilateral pulmonary emboli with moderate clot burden.    Assessment/Plan:   Pulmonary emboli    Continue Lovenox and Coumadin overlap to be monitored by pharmacy, she is symptom-free currently, no chest pain or shortness of breath due to her moderate clot burden she had elevated troponin c/w initial right heart strain due to clot burden , echo does not show any regional wall motion abnormalities, EKG is nonacute changes, she is chest pain pressure or symptom-free. We'll add low- beta blocker for now, non-ACS pattern troponin trend. She was treated with aspirin for the first few days. However in the light of Coumadin and moderate fall risk will not add aspirin chronically to her Coumadin.  Outpatient cardiology followup post discharge in 1 weeks. Minimum anti-coagulation with Coumadin should be 6-9 months, please have patient see pulmonologist prior to discontinuing Coumadin.    E coli Urinary tract infection  Has been treated.   Hypoxia  Not requiring any oxygen currently, hypoxia initially was due to PE, monitor her symptoms.     Dementia  Quite severe memory deficit which apparently has worsened rapidly in the past 2 months. Full fall precautions and activity with assistance.     DM  Reasonably controlled at this time continue present care. Her home  dose Glucophage will be resumed.   CBG (last 3)   Basename 02/16/12 0809 02/15/12 2132 02/15/12 1728  GLUCAP 123* 112* 186*      HTN  Will monitor on low-dose Lopressor, keep a close eye on her oral intake if it is poor consider discontinuing HCTZ and ACE inhibitor.         Today   Subjective:   [name] today has no headache,no chest abdominal pain,no new weakness tingling or numbness, feels much better.  Objective:   Blood pressure 154/72, pulse 75, temperature 98.1 F (36.7 C), temperature source Oral, resp. rate 18, height 5\' 7"  (1.702 m), weight 86.2 kg (190 lb 0.6 oz), SpO2 95.00%.   Intake/Output Summary (Last 24 hours) at 02/16/12 0929 Last data filed at 02/16/12 0700  Gross per 24 hour   Intake    720 ml  Output      0 ml  Net    720 ml    Exam Awake pleasantly confused, No FN deficits, Normal affect [place].AT,PERRAL Supple Neck,No JVD, No cervical lymphadenopathy appriciated.  Symmetrical Chest wall movement, Good air movement bilaterally, CTAB RRR,No Gallops,Rubs or new Murmurs, No Parasternal Heave +ve B.Sounds, Abd Soft, Non tender, No organomegaly appriciated, No rebound -guarding or rigidity. No Cyanosis, Clubbing or edema, No new Rash or bruise  Data Review   Major procedures and Radiology Reports - PLEASE review detailed and final reports for all details in brief -   Echo  - Left ventricle: The cavity size was normal. Wall thickness was normal. Systolic function was normal. The estimated ejection fraction was in the range of 55% to 60%. Wall motion was normal; there were no regional wall motion abnormalities. Doppler parameters are consistent with abnormal left ventricular relaxation (grade 1 diastolic dysfunction). - Aortic valve: There was no stenosis. Trivial regurgitation. - Mitral valve: No significant regurgitation. - Right ventricle: The cavity size was mildly to moderately dilated. Systolic function was normal. - Right atrium: The atrium was mildly dilated. - Pulmonary arteries: PA systolic pressure 37-41 mmHg. - Systemic veins: IVC measured 1.9 cm with normal respirophasic variation, suggesting RA pressure 6-10 mmHg.   Impressions:  - Normal LV size and systolic function, EF 55-60%. The RV appears mild - moderately dilated with normal systolic function. Mild pulmonary hypertension. Mild right atrial enlargement.    Dg Chest 2 View  02/11/2012  *RADIOLOGY REPORT*  Clinical Data: Nausea, fatigue.  CHEST - 2 VIEW  Comparison: February 20, 2010.  Findings: Cardiomediastinal silhouette appears normal.  Old compression fracture of mid thoracic vertebral body is noted.  Mild interstitial densities are noted throughout both lungs most consistent with  scarring.  No acute pulmonary disease is noted.  IMPRESSION: Probable bilateral scarring is noted.  No acute cardiopulmonary abnormality seen.   Original Report Authenticated By: Lupita Raider.,  M.D.    Ct Angio Chest Pe W/cm &/or Wo Cm  02/11/2012  *RADIOLOGY REPORT*  Clinical Data: Hypoxia short of breath  CT ANGIOGRAPHY CHEST  Technique:  Multidetector CT imaging of the chest using the standard protocol during bolus administration of intravenous contrast. Multiplanar reconstructed images including MIPs were obtained and reviewed to evaluate the vascular anatomy.  Contrast: OMNIPAQUE IOHEXOL 350 MG/ML SOLN  Comparison: Chest x-ray from today  Findings: Pulmonary embolism is present bilaterally.  Upper and lower lobe pulmonary arteries show filling defect bilaterally. Main pulmonary artery is normal.  Negative for pericardial effusion.  Heart size is normal.  Lungs are clear without infiltrate  or effusion.  No mass or adenopathy. Small hiatal hernia.  Severe compression fracture of  lower thoracic spine is chronic and unchanged from prior studies.  IMPRESSION: Bilateral pulmonary emboli with moderate clot burden.  I discussed the findings by telephone with Dr. Manus Gunning   Original Report Authenticated By: Janeece Riggers, M.D.     Micro Results    Recent Results (from the past 240 hour(s))  URINE CULTURE     Status: Normal   Collection Time   02/11/12  2:35 PM      Component Value Range Status Comment   Specimen Description URINE, CATHETERIZED   Final    Special Requests NONE   Final    Culture  Setup Time 02/12/2012 01:24   Final    Colony Count >=100,000 COLONIES/ML   Final    Culture ESCHERICHIA COLI   Final    Report Status 02/13/2012 FINAL   Final    Organism ID, Bacteria ESCHERICHIA COLI   Final   MRSA PCR SCREENING     Status: Normal   Collection Time   02/11/12  7:28 PM      Component Value Range Status Comment   MRSA by PCR NEGATIVE  NEGATIVE Final      CBC w Diff: Lab  Results  Component Value Date   WBC 7.5 02/14/2012   HGB 11.9* 02/14/2012   HCT 36.0 02/14/2012   PLT 203 02/14/2012   LYMPHOPCT 11* 02/11/2012   MONOPCT 6 02/11/2012   EOSPCT 1 02/11/2012   BASOPCT 0 02/11/2012    CMP: Lab Results  Component Value Date   NA 138 02/13/2012   K 4.1 02/13/2012   CL 104 02/13/2012   CO2 23 02/13/2012   BUN 17 02/13/2012   CREATININE 1.06 02/13/2012   PROT 6.6 02/11/2012   ALBUMIN 2.8* 02/11/2012   BILITOT 0.2* 02/11/2012   ALKPHOS 90 02/11/2012   AST 25 02/11/2012   ALT 23 02/11/2012  .  Lab Results  Component Value Date   INR 1.14 02/16/2012   INR 1.13 02/15/2012   INR 1.13 02/11/2012     Discharge Instructions     Follow with Primary MD  in 7 days   Get CBC, CMP, INR checked 2 days by Primary MD or M.D. at nursing facility and again as instructed by your Primary MD.   Accuchecks 4 times/day, Once in AM empty stomach and then before each meal. Log in all results and show them to your Prim.MD in 3 days. If any glucose reading is under 80 or above 300 call your Prim MD immidiately. Follow Low glucose instructions for glucose under 80 as instructed.  Get Medicines reviewed and adjusted.  Please request your Prim.MD to go over all Hospital Tests and Procedure/Radiological results at the follow up, please get all Hospital records sent to your Prim MD by signing hospital release before you go home.  Activity: As tolerated with Full fall precautions use walker/cane & assistance as needed   Diet:  Heart Healthy - Low Carb,  Aspiration precautions.  For Heart failure patients - Check your Weight same time everyday, if you gain over 2 pounds, or you develop in leg swelling, experience more shortness of breath or chest pain, call your Primary MD immediately. Follow Cardiac Low Salt Diet and 1.8 lit/day fluid restriction.  Disposition SNF  If you experience worsening of your admission symptoms, develop shortness of breath, life  threatening emergency, suicidal or homicidal thoughts you must seek medical attention immediately by  calling 911 or calling your MD immediately  if symptoms less severe.  You Must read complete instructions/literature along with all the possible adverse reactions/side effects for all the Medicines you take and that have been prescribed to you. Take any new Medicines after you have completely understood and accpet all the possible adverse reactions/side effects.   Do not drive and provide baby sitting services if your were admitted for syncope or siezures until you have seen by Primary MD or a Neurologist and advised to do so again.  Do not drive when taking Pain medications.    Do not take more than prescribed Pain, Sleep and Anxiety Medications  Special Instructions: If you have smoked or chewed Tobacco  in the last 2 yrs please stop smoking, stop any regular Alcohol  and or any Recreational drug use.  Wear Seat belts while driving.   Follow-up Information    Follow up with Primary MD. In 2 days.           Discharge Medications     Medication List     As of 02/16/2012  9:29 AM    START taking these medications         enoxaparin 100 MG/ML injection   Commonly known as: LOVENOX   Inject 0.85 mLs (85 mg total) into the skin every 12 (twelve) hours. Stop once the INR is above 2 for 48 hours      metoprolol tartrate 25 MG tablet   Commonly known as: LOPRESSOR   Take 1 tablet (25 mg total) by mouth 2 (two) times daily.      warfarin 5 MG tablet   Commonly known as: COUMADIN   Take 1 tablet (5 mg total) by mouth daily. Check INR every 2 days for 2 weeks, goal INR 2-3      CONTINUE taking these medications         acetaminophen 500 MG tablet   Commonly known as: TYLENOL      docusate sodium 100 MG capsule   Commonly known as: COLACE      donepezil 10 MG tablet   Commonly known as: ARICEPT      hydrochlorothiazide 12.5 MG capsule   Commonly known as: MICROZIDE       lisinopril 10 MG tablet   Commonly known as: PRINIVIL,ZESTRIL      memantine 10 MG tablet   Commonly known as: NAMENDA      metFORMIN 500 MG 24 hr tablet   Commonly known as: GLUCOPHAGE-XR      multivitamin with minerals Tabs      omeprazole 20 MG capsule   Commonly known as: PRILOSEC      vitamin D (CHOLECALCIFEROL) 400 UNITS tablet          Where to get your medications       Information on where to get these meds is not yet available. Ask your nurse or doctor.         enoxaparin 100 MG/ML injection   metoprolol tartrate 25 MG tablet   warfarin 5 MG tablet               Total Time in preparing paper work, data evaluation and todays exam - 35 minutes  Leroy Sea M.D on 02/16/2012 at 9:29 AM  [facility] Office  [phone]

## 2012-02-16 NOTE — Clinical Social Work Note (Signed)
Clinical Social Worker spoke with Modena Nunnery [phone] ext [phone]), Health and Wellness Director at [facility] and she reported that she feels that they can still accommodate patient and are agreeable to accept her back today. Per Ms. White, they are a memory care unit and stated that they can arrange PT/OT and speech services through their home health agency. CSW spoke with patient's daughter, [name] and she is agreeable for patient to return back to ALF at discharge. CSW will facilitate discharge back to ALF and patient will [facility].   Rozetta Nunnery MSW, [employer] [phone]

## 2012-02-16 NOTE — Progress Notes (Signed)
Physical Therapy Treatment Note   02/16/12 1254  PT Visit Information  Last PT Received On 02/16/12  Assistance Needed +1  PT Time Calculation  PT Start Time 1254  PT Stop Time 1314  PT Time Calculation (min) 20 min  Subjective Data  Subjective Pt received sitting up in chair with daughter feeding pt lunch.  Precautions  Precautions Fall  Restrictions  Weight Bearing Restrictions No  Cognition  Overall Cognitive Status History of cognitive impairments - at baseline  Arousal/Alertness Awake/alert  Orientation Level Disoriented to;Place;Time;Situation (h/o dementia)  Behavior During Session [facility] for tasks performed  Transfers  Transfers Sit to Stand;Stand to Sit  Sit to Stand 4: Min assist;Without upper extremity assist;With armrests;From toilet  Stand to Sit 4: Min assist;With armrests  Details for Transfer Assistance verbal and tactile cues for task at hand  Ambulation/Gait  Ambulation/Gait Assistance 4: Min assist  Ambulation Distance (Feet) 175 Feet  Assistive device Rolling walker  Ambulation/Gait Assistance Details minA for walker management, pt pushing RW to far in front, max v/c's to stay inside walker  Gait Pattern [facility]  General Gait Details increased trunk flexion, no LOB, SpO2 >95% on room air  Stairs No  PT - End of Session  Equipment Utilized During Treatment Gait belt  Activity Tolerance Patient tolerated treatment well  Patient left in chair;with call bell/phone within reach  Nurse Communication Mobility status  PT - Assessment/Plan  Comments on Treatment Session Pt tolerating mobility well. Pt to con't to require SNF placement due to dementia and cognitive impairments.  PT Plan Discharge plan remains appropriate;Frequency remains appropriate  PT Frequency Min 3X/week  Follow Up Recommendations SNF;Supervision for mobility/OOB  PT equipment Rolling walker with 5" wheels  Acute Rehab PT Goals  PT Goal: Sit to Stand - Progress Progressing toward goal  PT Goal:  Ambulate - Progress Progressing toward goal  PT General Charges  $$ ACUTE PT VISIT 1 Procedure  PT Treatments  $Gait Training 8-22 mins    Pain: pt denies  Lewis Shock, PT, DPT Pager #: [phone] Office #: [phone]

## 2012-02-16 NOTE — Care Management Note (Signed)
  Page 1 of 1   02/16/2012     3:12:18 PM   CARE MANAGEMENT NOTE 02/16/2012  Patient:  Desiree Gonzalez, Desiree Gonzalez   Account Number:  [id number]  Date Initiated:  02/16/2012  Documentation initiated by:  Ronny Flurry  Subjective/Objective Assessment:     Action/Plan:   Anticipated DC Date:     Anticipated DC Plan:  ASSISTED LIVING / REST HOME  In-house referral  Clinical Social Worker         Choice offered to / List presented to:             Status of service:   Medicare Important Message given?   (If response is "NO", the following Medicare IM given date fields will be blank) Date Medicare IM given:   Date Additional Medicare IM given:    Discharge Disposition:  ASSISTED LIVING  Per UR Regulation:    If discussed at Long Length of Stay Meetings, dates discussed:    Comments:  02-16-12 Orders for home health , patient discharging to [facility] who will arrange home health .  Ronny Flurry RN [facility] [phone]

## 2012-12-11 ENCOUNTER — Encounter (HOSPITAL_COMMUNITY): Payer: Self-pay | Admitting: Emergency Medicine

## 2012-12-11 ENCOUNTER — Emergency Department (HOSPITAL_COMMUNITY)
Admission: EM | Admit: 2012-12-11 | Discharge: 2012-12-11 | Disposition: A | Discharge status: Skilled Nursing Facility | Payer: Medicare Other | Attending: Emergency Medicine | Admitting: Emergency Medicine

## 2012-12-11 DIAGNOSIS — Y939 Activity, unspecified: Secondary | ICD-10-CM | POA: Insufficient documentation

## 2012-12-11 DIAGNOSIS — I10 Essential (primary) hypertension: Secondary | ICD-10-CM | POA: Insufficient documentation

## 2012-12-11 DIAGNOSIS — Y9210 Unspecified residential institution as the place of occurrence of the external cause: Secondary | ICD-10-CM | POA: Insufficient documentation

## 2012-12-11 DIAGNOSIS — G309 Alzheimer's disease, unspecified: Secondary | ICD-10-CM | POA: Insufficient documentation

## 2012-12-11 DIAGNOSIS — F0280 Dementia in other diseases classified elsewhere without behavioral disturbance: Secondary | ICD-10-CM | POA: Insufficient documentation

## 2012-12-11 DIAGNOSIS — IMO0002 Reserved for concepts with insufficient information to code with codable children: Secondary | ICD-10-CM | POA: Insufficient documentation

## 2012-12-11 DIAGNOSIS — R296 Repeated falls: Secondary | ICD-10-CM | POA: Insufficient documentation

## 2012-12-11 DIAGNOSIS — N390 Urinary tract infection, site not specified: Secondary | ICD-10-CM | POA: Insufficient documentation

## 2012-12-11 DIAGNOSIS — K219 Gastro-esophageal reflux disease without esophagitis: Secondary | ICD-10-CM | POA: Insufficient documentation

## 2012-12-11 DIAGNOSIS — W19XXXA Unspecified fall, initial encounter: Secondary | ICD-10-CM

## 2012-12-11 DIAGNOSIS — Z7982 Long term (current) use of aspirin: Secondary | ICD-10-CM | POA: Insufficient documentation

## 2012-12-11 DIAGNOSIS — S0993XA Unspecified injury of face, initial encounter: Secondary | ICD-10-CM | POA: Insufficient documentation

## 2012-12-11 DIAGNOSIS — Z8673 Personal history of transient ischemic attack (TIA), and cerebral infarction without residual deficits: Secondary | ICD-10-CM | POA: Insufficient documentation

## 2012-12-11 DIAGNOSIS — Z79899 Other long term (current) drug therapy: Secondary | ICD-10-CM | POA: Insufficient documentation

## 2012-12-11 DIAGNOSIS — M899 Disorder of bone, unspecified: Secondary | ICD-10-CM | POA: Insufficient documentation

## 2012-12-11 DIAGNOSIS — E119 Type 2 diabetes mellitus without complications: Secondary | ICD-10-CM | POA: Insufficient documentation

## 2012-12-11 LAB — URINE MICROSCOPIC-ADD ON

## 2012-12-11 LAB — POCT I-STAT, CHEM 8
BUN: 21 mg/dL (ref 6–23)
Chloride: 99 mEq/L (ref 96–112)
HCT: 40 % (ref 36.0–46.0)
Potassium: 3.7 mEq/L (ref 3.5–5.1)

## 2012-12-11 LAB — URINALYSIS, ROUTINE W REFLEX MICROSCOPIC
Bilirubin Urine: NEGATIVE
Glucose, UA: NEGATIVE mg/dL
Nitrite: POSITIVE — AB
Specific Gravity, Urine: 1.022 (ref 1.005–1.030)
pH: 5.5 (ref 5.0–8.0)

## 2012-12-11 MED ORDER — SULFAMETHOXAZOLE-TMP DS 800-160 MG PO TABS
1.0000 | ORAL_TABLET | Freq: Once | ORAL | Status: AC
  Administered 2012-12-11: 1 via ORAL
  Filled 2012-12-11: qty 1

## 2012-12-11 MED ORDER — SULFAMETHOXAZOLE-TMP DS 800-160 MG PO TABS: 1.0000 | ORAL_TABLET | Freq: Two times a day (BID) | ORAL | Status: DC

## 2012-12-11 NOTE — ED Provider Notes (Signed)
CSN: [id number]     Arrival date & time 12/11/12  1600 History   First MD Initiated Contact with Patient 12/11/12 1603     Chief Complaint  Patient presents with  . Fall   (Consider location/radiation/quality/duration/timing/severity/associated sxs/prior Treatment) HPI Comments: Level 5 caveat applies secondary to dementia  The patient is a 79 year old female who presents after being found on the floor at her nursing home. She is in the memory care unit, according to the staff by EMS report the patient has no change from baseline, initially complained of neck and back pain but also has chronic pain in those locations. She was placed in a cervical collar prior to arrival. Paramedics noted no obvious deformities or hematomas or outward signs of injury. The patient is unable to give any valuable information, she has no memory of the event, she is unaware of the location, unaware of the President of the [place].   Patient is a 79 y.o. female presenting with fall. The history is provided by the patient and the EMS personnel.  Fall    Past Medical History  Diagnosis Date  . CVA (cerebral infarction)   . Alzheimer's dementia   . GERD (gastroesophageal reflux disease)   . Hypertension   . Osteopenia   . Diabetes mellitus without complication    History reviewed. No pertinent past surgical history. No family history on file. History  Substance Use Topics  . Smoking status: Never Smoker   . Smokeless tobacco: Not on file  . Alcohol Use: No   OB History   Grav Para Term Preterm Abortions TAB SAB Ect Mult Living                 Review of Systems  Unable to perform ROS: Dementia    Allergies  Review of patient's allergies indicates no known allergies.  Home Medications   Current Outpatient Rx  Name  Route  Sig  Dispense  Refill  . acetaminophen (TYLENOL) 500 MG tablet   Oral   Take 1,000 mg by mouth every 6 (six) hours as needed. For pain         . aspirin 81 MG  chewable tablet   Oral   Chew 81 mg by mouth daily.         Marland Kitchen docusate sodium (COLACE) 100 MG capsule   Oral   Take 100 mg by mouth daily as needed. For constipation         . donepezil (ARICEPT) 10 MG tablet   Oral   Take 10 mg by mouth at bedtime.           Marland Kitchen glipiZIDE (GLUCOTROL) 5 MG tablet   Oral   Take 2.5 mg by mouth daily.          . hydrochlorothiazide (,MICROZIDE/HYDRODIURIL,) 12.5 MG capsule   Oral   Take 12.5 mg by mouth daily.           . hydrocortisone 2.5 % cream   Topical   Apply 1 application topically 4 (four) times daily as needed (inflamed,itchy, painful hemorrhoids).          Marland Kitchen lisinopril (PRINIVIL,ZESTRIL) 10 MG tablet   Oral   Take 10 mg by mouth daily.           . memantine (NAMENDA) 10 MG tablet   Oral   Take 10 mg by mouth 2 (two) times daily.           . metoprolol tartrate (LOPRESSOR) 25 MG  tablet   Oral   Take 1 tablet (25 mg total) by mouth 2 (two) times daily.   30 tablet   1   . Multiple Vitamin (MULTIVITAMIN WITH MINERALS) TABS   Oral   Take 1 tablet by mouth every morning.         . nystatin-triamcinolone (MYCOLOG II) cream   Topical   Apply 1 application topically 2 (two) times daily.          . vitamin D, CHOLECALCIFEROL, 400 UNITS tablet   Oral   Take 400 Units by mouth daily.          Marland Kitchen sulfamethoxazole-trimethoprim (BACTRIM DS) 800-160 MG per tablet   Oral   Take 1 tablet by mouth 2 (two) times daily.   14 tablet   0    BP 150/60  Pulse 73  Temp(Src) 98.2 F (36.8 C) (Oral)  Resp 19  SpO2 98% Physical Exam  Nursing note and vitals reviewed. Constitutional: She appears well-developed and well-nourished. No distress.  HENT:  Head: Normocephalic and atraumatic.  Mouth/Throat: Oropharynx is clear and moist. No oropharyngeal exudate.  no facial tenderness, deformity, malocclusion or hemotympanum.  no battle's sign or racoon eyes.   Eyes: Conjunctivae and EOM are normal. Pupils are equal,  round, and reactive to light. Right eye exhibits no discharge. Left eye exhibits no discharge. No scleral icterus.  Neck: Normal range of motion. Neck supple. No JVD present. No thyromegaly present.  Cardiovascular: Normal rate, regular rhythm, normal heart sounds and intact distal pulses.  Exam reveals no gallop and no friction rub.   No murmur heard. Pulmonary/Chest: Effort normal and breath sounds normal. No respiratory distress. She has no wheezes. She has no rales. She exhibits no tenderness.  No bruising to the chest wall, no subcutaneous emphysema, no tenderness of the ribs  Abdominal: Soft. Bowel sounds are normal. She exhibits no distension and no mass. There is no tenderness.  Very soft abdomen, obese, no signs of bruising, no guarding, no peritoneal signs, no tenderness  Musculoskeletal: Normal range of motion. She exhibits no edema and no tenderness.  The patient is able to raise both arms 5 high above her head, lift both legs off the bed without decrease spontaneously, has normal range of motion of all joints without deformity or tenderness.  Lymphadenopathy:    She has no cervical adenopathy.  Neurological: She is alert. Coordination normal.  Speech is clear, cranial nerves III through XII appear intact, with all extremities x4 with normal strength to command. Memory loss present, apparent at baseline.  Skin: Skin is warm and dry. No rash noted. No erythema.  Injury to the skin or bruising  Psychiatric: She has a normal mood and affect. Her behavior is normal.    ED Course  Procedures (including critical care time) Labs Review Labs Reviewed  URINALYSIS, ROUTINE W REFLEX MICROSCOPIC - Abnormal; Notable for the following:    APPearance CLOUDY (*)    Nitrite POSITIVE (*)    Leukocytes, UA LARGE (*)    All other components within normal limits  URINE MICROSCOPIC-ADD ON - Abnormal; Notable for the following:    Squamous Epithelial / LPF FEW (*)    Bacteria, UA MANY (*)    All  other components within normal limits  POCT I-STAT, CHEM 8 - Abnormal; Notable for the following:    Creatinine, Ser 1.30 (*)    Glucose, Bld 190 (*)    All other components within normal limits  URINE CULTURE  Imaging Review No results found.  EKG Interpretation     Ventricular Rate:  75 PR Interval:  182 QRS Duration: 109 QT Interval:  426 QTC Calculation: 476 R Axis:   26 Text Interpretation:  Normal sinus rhythm . axis normal . QRS slightly prolonged, ivcd ECG OTHERWISE WITHIN NORMAL LIMITS Compatible with prior ECG, LVH criteria no longer present            MDM   1. UTI (lower urinary tract infection)   2. Fall, initial encounter    The patient has no outward signs of trauma, there is some question as to the etiology of how she ended up on the floor. She does not take any medications with measurable levels, has vital signs which are reassuring. Will check a urinalysis, EKG and an i-STAT chemistry.  Urinalysis confirms infection, creatinine minimally elevated compared to baseline, no acute intervention is necessary. Antibiotics to be given, prescription for home, stable for discharge.  Meds given in ED:  Medications  sulfamethoxazole-trimethoprim (BACTRIM DS) 800-160 MG per tablet 1 tablet (not administered)    New Prescriptions   SULFAMETHOXAZOLE-TRIMETHOPRIM (BACTRIM DS) 800-160 MG PER TABLET    Take 1 tablet by mouth 2 (two) times daily.      Vida Roller, MD 12/11/12 [phone]

## 2012-12-11 NOTE — ED Notes (Addendum)
Attempted to call [facility]; no answer. Report/discharge instructions to be given to EMS to return to facility nurse.

## 2012-12-11 NOTE — ED Notes (Signed)
Per EMS-nursing staff found patient on floor, from memory care unit-no change from baseline-c/o neck/back pain although patient states she has chronic pain-placed in c-collar-no deformities/hematomas

## 2012-12-11 NOTE — ED Notes (Signed)
Bed: ZO10 Expected date: 12/11/12 Expected time: 3:43 PM Means of arrival:  Comments: fall

## 2012-12-11 NOTE — ED Notes (Signed)
PTAR given d/c instruction to give to facility; present at bedside transferring pt.

## 2012-12-11 NOTE — ED Notes (Signed)
[facility] contacted to transfer pt to [facility].

## 2012-12-15 ENCOUNTER — Telehealth (HOSPITAL_COMMUNITY): Payer: Self-pay | Admitting: *Deleted

## 2012-12-15 LAB — URINE CULTURE

## 2012-12-15 NOTE — ED Notes (Signed)
Call from [facility] to inform me that pt's Urine Culture is ESBL +.  Per lab tech sensitive to Bactrim.  Treated with same.

## 2013-05-07 ENCOUNTER — Emergency Department (HOSPITAL_COMMUNITY)
Admission: EM | Admit: 2013-05-07 | Discharge: 2013-05-07 | Disposition: A | Discharge status: Skilled Nursing Facility | Payer: Medicare Other | Attending: Emergency Medicine | Admitting: Emergency Medicine

## 2013-05-07 ENCOUNTER — Encounter (HOSPITAL_COMMUNITY): Payer: Self-pay | Admitting: Emergency Medicine

## 2013-05-07 ENCOUNTER — Emergency Department (HOSPITAL_COMMUNITY): Payer: Medicare Other

## 2013-05-07 DIAGNOSIS — G309 Alzheimer's disease, unspecified: Secondary | ICD-10-CM | POA: Insufficient documentation

## 2013-05-07 DIAGNOSIS — Z7982 Long term (current) use of aspirin: Secondary | ICD-10-CM | POA: Insufficient documentation

## 2013-05-07 DIAGNOSIS — E119 Type 2 diabetes mellitus without complications: Secondary | ICD-10-CM | POA: Insufficient documentation

## 2013-05-07 DIAGNOSIS — F0390 Unspecified dementia without behavioral disturbance: Secondary | ICD-10-CM

## 2013-05-07 DIAGNOSIS — S0083XA Contusion of other part of head, initial encounter: Secondary | ICD-10-CM

## 2013-05-07 DIAGNOSIS — Z8673 Personal history of transient ischemic attack (TIA), and cerebral infarction without residual deficits: Secondary | ICD-10-CM | POA: Insufficient documentation

## 2013-05-07 DIAGNOSIS — S0003XA Contusion of scalp, initial encounter: Secondary | ICD-10-CM | POA: Insufficient documentation

## 2013-05-07 DIAGNOSIS — Z8739 Personal history of other diseases of the musculoskeletal system and connective tissue: Secondary | ICD-10-CM | POA: Insufficient documentation

## 2013-05-07 DIAGNOSIS — W19XXXA Unspecified fall, initial encounter: Secondary | ICD-10-CM

## 2013-05-07 DIAGNOSIS — T148XXA Other injury of unspecified body region, initial encounter: Secondary | ICD-10-CM

## 2013-05-07 DIAGNOSIS — S1093XA Contusion of unspecified part of neck, initial encounter: Secondary | ICD-10-CM

## 2013-05-07 DIAGNOSIS — Y939 Activity, unspecified: Secondary | ICD-10-CM | POA: Insufficient documentation

## 2013-05-07 DIAGNOSIS — I10 Essential (primary) hypertension: Secondary | ICD-10-CM | POA: Insufficient documentation

## 2013-05-07 DIAGNOSIS — Z8719 Personal history of other diseases of the digestive system: Secondary | ICD-10-CM | POA: Insufficient documentation

## 2013-05-07 DIAGNOSIS — IMO0002 Reserved for concepts with insufficient information to code with codable children: Secondary | ICD-10-CM | POA: Insufficient documentation

## 2013-05-07 DIAGNOSIS — R296 Repeated falls: Secondary | ICD-10-CM | POA: Insufficient documentation

## 2013-05-07 DIAGNOSIS — F0280 Dementia in other diseases classified elsewhere without behavioral disturbance: Secondary | ICD-10-CM | POA: Insufficient documentation

## 2013-05-07 DIAGNOSIS — Z79899 Other long term (current) drug therapy: Secondary | ICD-10-CM | POA: Insufficient documentation

## 2013-05-07 DIAGNOSIS — Y9210 Unspecified residential institution as the place of occurrence of the external cause: Secondary | ICD-10-CM | POA: Insufficient documentation

## 2013-05-07 NOTE — Discharge Instructions (Signed)
Alzheimer Disease Alzheimer Disease (AD) is a mental disorder. It causes memory loss and loss of other mental functions, such as learning, thinking, solving problems, communicating, and completing tasks. The mental losses interfere with the ability to perform daily activities at work, at home, or in social situations. AD usually starts in the late 60s or early 88s but can start earlier in life (familial form). The mental changes caused by AD are permanent and worsen over time. As the illness progresses, the ability to do even the simplest things is lost. Survival with AD ranges from several years to as long as 20 years. CAUSES AD is caused by abnormally high levels of a protein (beta-amyloid) in the brain. This protein forms very small deposits within and around the brain's nerve cells. These deposits prevent the nerve cells from working properly. Experts are not certain what causes the beta-amyloid deposits in AD. RISK FACTORS The following major risk factors have been identified:  Increasing age.  Certain genetic variations, such as Down syndrome (trisomy 21). SYMPTOMS The earliest mental change in AD is mild memory loss of recent events, names, or phone numbers. Other symptoms at the beginning of AD include loss of objects, minor loss of vocabulary, and difficulty with complex tasks, such as paying bills or driving in unfamiliar locations. At this stage, you are still able to perform daily activities but need greater effort, more time, or memory aids. Other mental functions deteriorate as AD worsens. These changes slowly go from mild to severe. Symptoms at this stage include:  Difficulty remembering You may not be able to recall personal information such as your address and telephone number. You may become confused about the date, the season of the year, or your location.  Difficulty maintaining attention You may forget what you wanted to say during conversations and repeat what you have already  said.  Difficulty learning new information or tasks You may not remember what you read or the name of a new friend you met.  Difficulty counting or doing math You may have difficulty with complex math problems. You may make mistakes in paying bills or managing your checkbook.  Poor reasoning and judgment You may make poor decisions or not dress right for the weather.  Difficulty communicating You may have regular difficulty remembering words, naming objects, expressing yourself clearly, or writing sentences that make sense.  Difficulty performing familiar daily activities You may get lost driving in familiar locations or need help eating, bathing, dressing, grooming, or using the toilet. You may have difficulty maintaining bladder or bowel control.  Difficulty recognizing familiar faces You may confuse family members or close friends with one another. You may not recognize a close relative or may mistake strangers for family. AD also may cause changes in personality and behavior. These changes include loss of interest or motivation, social withdrawal, anxiety, difficulty sleeping, uncharacteristic anger or combativeness, a false belief that someone is trying to harm you (paranoia), seeing things that are not real (hallucinations), or agitation. Confusion and disruptive behavior are often worse at night and may be triggered by changes in the environment or acute medical issues. DIAGNOSIS  AD is diagnosed through an assessment by your health care provider. During this assessment, your health care provider will do the following:  Ask you and your family, friends, or caregiver questions about your symptoms, their frequency, their duration and progression, and the effect they are having on your life.  Ask questions about your personal and family medical history and use  of alcohol or drugs, including prescription medicine.  Perform a physical exam and order blood tests and brain imaging exams. Your  health care provider may refer you to a specialist for detailed evaluation of your mental functions (neuropsychological testing).  Many different brain disorders, medical conditions, and certain substances can cause symptoms that resemble AD symptoms. These must be ruled out before AD can be diagnosed. If AD is diagnosed, it will be considered either "possible" or "probable" AD. "Possible" AD means that your symptoms are typical of AD and no other disorder is causing them. "Probable" AD means that you also have a family history of AD or genetic test results that support the diagnosis. Certain tests, mostly used in research studies, are highly specific for AD.  TREATMENT  There is currently no cure for AD. The goals of treatment are to:  Slow down the progression of the disease.  Preserve mental function as long as possible.  Manage behavioral symptoms.  Make life easier for the person with AD and their caregivers. The following treatment options are available:  Medicine Certain medicines may help slow memory loss by changing the level of certain chemicals in the brain. Medicine may also help with behavioral symptoms.  Talk therapy Talk therapy provides education, support, and memory aids for people with AD. It is most effective in the early stages of the illness.  Caregiving Caregivers may be family members, friends, or trained medical professionals. They help the person with AD with daily life activities. Caregiving may take place at home or at a nursing facility.  Family support groups These provide education, emotional support, and information about community resources to family members who are taking care of the person with AD. Document Released: 10/30/2003 Document Revised: 10/20/2012 Document Reviewed: 06/25/2012 Exit[employer] Patient Information 2014 [employer].  

## 2013-05-07 NOTE — ED Notes (Signed)
Patient was found on the floor by nursing staff and EMS was called.

## 2013-05-07 NOTE — ED Provider Notes (Signed)
CSN: [id number]     Arrival date & time 05/07/13  0507 History   First MD Initiated Contact with Patient 05/07/13 [phone]     Chief Complaint  Patient presents with  . Fall     (Consider location/radiation/quality/duration/timing/severity/associated sxs/prior Treatment) HPI  This patient is an early woman with advanced dementia who resides in a dementia he had a local skilled nursing facility. She was found on the floor in her room, conscious, alert and at her baseline mental status. She was reflexively transported to the emergency department for evaluation.  The patient has no recall of the accident. She thinks that she drove herself to the hospital from her home. She is without complaints.   Past Medical History  Diagnosis Date  . CVA (cerebral infarction)   . Alzheimer's dementia   . GERD (gastroesophageal reflux disease)   . Hypertension   . Osteopenia   . Diabetes mellitus without complication    History reviewed. No pertinent past surgical history. History reviewed. No pertinent family history. History  Substance Use Topics  . Smoking status: Never Smoker   . Smokeless tobacco: Not on file  . Alcohol Use: No   OB History   Grav Para Term Preterm Abortions TAB SAB Ect Mult Living                 Review of Systems Limited ROS obtained due to dementia. Patient denies headache, chest pain, neck pain, back pain, extremity pain.  Please note that level 5 caveat applies.     Allergies  Review of patient's allergies indicates no known allergies.  Home Medications   Current Outpatient Rx  Name  Route  Sig  Dispense  Refill  . acetaminophen (TYLENOL) 500 MG tablet   Oral   Take 1,000 mg by mouth every 6 (six) hours as needed. For pain         . aspirin 81 MG chewable tablet   Oral   Chew 81 mg by mouth daily.         Marland Kitchen donepezil (ARICEPT) 10 MG tablet   Oral   Take 10 mg by mouth at bedtime.           Marland Kitchen glipiZIDE (GLUCOTROL XL) 10 MG 24 hr tablet   Oral  Take 10 mg by mouth daily with breakfast.         . hydrochlorothiazide (,MICROZIDE/HYDRODIURIL,) 12.5 MG capsule   Oral   Take 12.5 mg by mouth daily.           . hydrocortisone 2.5 % cream   Topical   Apply 1 application topically 4 (four) times daily as needed (inflamed,itchy, painful hemorrhoids).          Marland Kitchen lisinopril (PRINIVIL,ZESTRIL) 10 MG tablet   Oral   Take 10 mg by mouth daily.           . memantine (NAMENDA) 10 MG tablet   Oral   Take 5 mg by mouth 2 (two) times daily.          . metoprolol tartrate (LOPRESSOR) 25 MG tablet   Oral   Take 1 tablet (25 mg total) by mouth 2 (two) times daily.   30 tablet   1   . psyllium (REGULOID) 0.52 G capsule   Oral   Take 0.52 g by mouth daily.         . vitamin D, CHOLECALCIFEROL, 400 UNITS tablet   Oral   Take 400 Units by mouth daily.  BP 123/60  Pulse 63  Temp(Src) 97.7 F (36.5 C) (Axillary)  Resp 20  SpO2 96% Physical Exam Gen: well developed and well nourished appearing Head: NCAT, very superficial abrasion left forehead Eyes: PERL, EOMI Nose: no epistaixis or rhinorrhea Mouth/throat: mucosa is moist and pink, no intra oral trauma Neck: supple, no stridor, no midline ttp Lungs: CTA B, no wheezing, rhonchi or rales Chest wall: no ttp CV: regular rate and rythm, good distal pulses.  Abd: soft, notender, nondistended Back: no midline ttp Skin: warm and dry Ext: no edema, normal to inspection, FROM without pain x 4 ext Neuro: CN ii-xii grossly intact, no focal deficits Psyche; normal affect,  calm and cooperative.  ED Course  Procedures (including critical care time)   MDM   Final diagnoses:  Fall  Dementia  Abrasion  Contusion      Brandt LoosenJulie Miliani Deike, MD 05/07/13 [phone]

## 2013-05-07 NOTE — ED Notes (Signed)
Bed: WA25 Expected date:  Expected time:  Means of arrival:  Comments: EMS  

## 2013-09-18 ENCOUNTER — Emergency Department (HOSPITAL_COMMUNITY): Payer: Medicare Other

## 2013-09-18 ENCOUNTER — Inpatient Hospital Stay (HOSPITAL_COMMUNITY)
Admission: EM | Admit: 2013-09-18 | Discharge: 2013-09-22 | DRG: 871 | Disposition: A | Discharge status: Nursing Home (Includes ICF) | Payer: Medicare Other | Attending: Internal Medicine | Admitting: Internal Medicine

## 2013-09-18 ENCOUNTER — Encounter (HOSPITAL_COMMUNITY): Payer: Self-pay | Admitting: Nurse Practitioner

## 2013-09-18 DIAGNOSIS — K219 Gastro-esophageal reflux disease without esophagitis: Secondary | ICD-10-CM | POA: Diagnosis present

## 2013-09-18 DIAGNOSIS — I129 Hypertensive chronic kidney disease with stage 1 through stage 4 chronic kidney disease, or unspecified chronic kidney disease: Secondary | ICD-10-CM | POA: Diagnosis present

## 2013-09-18 DIAGNOSIS — E119 Type 2 diabetes mellitus without complications: Secondary | ICD-10-CM | POA: Diagnosis present

## 2013-09-18 DIAGNOSIS — M949 Disorder of cartilage, unspecified: Secondary | ICD-10-CM

## 2013-09-18 DIAGNOSIS — R627 Adult failure to thrive: Secondary | ICD-10-CM | POA: Diagnosis present

## 2013-09-18 DIAGNOSIS — G9341 Metabolic encephalopathy: Secondary | ICD-10-CM | POA: Diagnosis present

## 2013-09-18 DIAGNOSIS — Z66 Do not resuscitate: Secondary | ICD-10-CM | POA: Diagnosis present

## 2013-09-18 DIAGNOSIS — M899 Disorder of bone, unspecified: Secondary | ICD-10-CM | POA: Diagnosis present

## 2013-09-18 DIAGNOSIS — Z6832 Body mass index (BMI) 32.0-32.9, adult: Secondary | ICD-10-CM

## 2013-09-18 DIAGNOSIS — N182 Chronic kidney disease, stage 2 (mild): Secondary | ICD-10-CM | POA: Diagnosis present

## 2013-09-18 DIAGNOSIS — Z79899 Other long term (current) drug therapy: Secondary | ICD-10-CM

## 2013-09-18 DIAGNOSIS — Z7982 Long term (current) use of aspirin: Secondary | ICD-10-CM | POA: Diagnosis not present

## 2013-09-18 DIAGNOSIS — R6510 Systemic inflammatory response syndrome (SIRS) of non-infectious origin without acute organ dysfunction: Secondary | ICD-10-CM

## 2013-09-18 DIAGNOSIS — Z8673 Personal history of transient ischemic attack (TIA), and cerebral infarction without residual deficits: Secondary | ICD-10-CM | POA: Diagnosis not present

## 2013-09-18 DIAGNOSIS — A419 Sepsis, unspecified organism: Principal | ICD-10-CM | POA: Diagnosis present

## 2013-09-18 DIAGNOSIS — N179 Acute kidney failure, unspecified: Secondary | ICD-10-CM | POA: Diagnosis present

## 2013-09-18 DIAGNOSIS — E43 Unspecified severe protein-calorie malnutrition: Secondary | ICD-10-CM | POA: Diagnosis present

## 2013-09-18 DIAGNOSIS — G309 Alzheimer's disease, unspecified: Secondary | ICD-10-CM | POA: Diagnosis present

## 2013-09-18 DIAGNOSIS — F0280 Dementia in other diseases classified elsewhere without behavioral disturbance: Secondary | ICD-10-CM | POA: Diagnosis present

## 2013-09-18 DIAGNOSIS — R532 Functional quadriplegia: Secondary | ICD-10-CM | POA: Diagnosis present

## 2013-09-18 DIAGNOSIS — N390 Urinary tract infection, site not specified: Secondary | ICD-10-CM | POA: Diagnosis present

## 2013-09-18 DIAGNOSIS — T68XXXA Hypothermia, initial encounter: Secondary | ICD-10-CM

## 2013-09-18 DIAGNOSIS — E876 Hypokalemia: Secondary | ICD-10-CM

## 2013-09-18 DIAGNOSIS — I2699 Other pulmonary embolism without acute cor pulmonale: Secondary | ICD-10-CM

## 2013-09-18 DIAGNOSIS — R4182 Altered mental status, unspecified: Secondary | ICD-10-CM | POA: Diagnosis present

## 2013-09-18 DIAGNOSIS — R55 Syncope and collapse: Secondary | ICD-10-CM

## 2013-09-18 DIAGNOSIS — F0390 Unspecified dementia without behavioral disturbance: Secondary | ICD-10-CM

## 2013-09-18 DIAGNOSIS — G9340 Encephalopathy, unspecified: Secondary | ICD-10-CM | POA: Diagnosis present

## 2013-09-18 DIAGNOSIS — R0902 Hypoxemia: Secondary | ICD-10-CM

## 2013-09-18 LAB — URINALYSIS, ROUTINE W REFLEX MICROSCOPIC
Bilirubin Urine: NEGATIVE
GLUCOSE, UA: NEGATIVE mg/dL
Hgb urine dipstick: NEGATIVE
KETONES UR: NEGATIVE mg/dL
NITRITE: POSITIVE — AB
PROTEIN: NEGATIVE mg/dL
Specific Gravity, Urine: 1.018 (ref 1.005–1.030)
UROBILINOGEN UA: 0.2 mg/dL (ref 0.0–1.0)
pH: 5.5 (ref 5.0–8.0)

## 2013-09-18 LAB — GLUCOSE, CAPILLARY
Glucose-Capillary: 149 mg/dL — ABNORMAL HIGH (ref 70–99)
Glucose-Capillary: 90 mg/dL (ref 70–99)
Glucose-Capillary: 142 mg/dL — ABNORMAL HIGH (ref 70–99)

## 2013-09-18 LAB — COMPREHENSIVE METABOLIC PANEL
ALBUMIN: 3 g/dL — AB (ref 3.5–5.2)
ALK PHOS: 103 U/L (ref 39–117)
ALT: 22 U/L (ref 0–35)
AST: 22 U/L (ref 0–37)
Anion gap: 14 (ref 5–15)
BUN: 27 mg/dL — AB (ref 6–23)
CALCIUM: 9.3 mg/dL (ref 8.4–10.5)
CO2: 24 mEq/L (ref 19–32)
Chloride: 96 mEq/L (ref 96–112)
Creatinine, Ser: 1.08 mg/dL (ref 0.50–1.10)
GFR calc non Af Amer: 44 mL/min — ABNORMAL LOW (ref >90)
GFR, EST AFRICAN AMERICAN: 52 mL/min — AB (ref >90)
Glucose, Bld: 157 mg/dL — ABNORMAL HIGH (ref 70–99)
POTASSIUM: 3.9 meq/L (ref 3.7–5.3)
Sodium: 134 mEq/L — ABNORMAL LOW (ref 137–147)
TOTAL PROTEIN: 6.9 g/dL (ref 6.0–8.3)
Total Bilirubin: <0.2 mg/dL — ABNORMAL LOW (ref 0.3–1.2)

## 2013-09-18 LAB — APTT: APTT: 29 s (ref 24–37)

## 2013-09-18 LAB — CBC WITH DIFFERENTIAL/PLATELET
BASOS PCT: 0 % (ref 0–1)
Basophils Absolute: 0 10*3/uL (ref 0.0–0.1)
EOS PCT: 0 % (ref 0–5)
Eosinophils Absolute: 0 10*3/uL (ref 0.0–0.7)
HCT: 36.8 % (ref 36.0–46.0)
Hemoglobin: 12.4 g/dL (ref 12.0–15.0)
Lymphocytes Relative: 9 % — ABNORMAL LOW (ref 12–46)
Lymphs Abs: 1.2 10*3/uL (ref 0.7–4.0)
MCH: 28.2 pg (ref 26.0–34.0)
MCHC: 33.7 g/dL (ref 30.0–36.0)
MCV: 83.6 fL (ref 78.0–100.0)
Monocytes Absolute: 0.7 10*3/uL (ref 0.1–1.0)
Monocytes Relative: 5 % (ref 3–12)
NEUTROS PCT: 86 % — AB (ref 43–77)
Neutro Abs: 11.1 10*3/uL — ABNORMAL HIGH (ref 1.7–7.7)
PLATELETS: 231 10*3/uL (ref 150–400)
RBC: 4.4 MIL/uL (ref 3.87–5.11)
RDW: 13.4 % (ref 11.5–15.5)
WBC: 13.1 10*3/uL — ABNORMAL HIGH (ref 4.0–10.5)

## 2013-09-18 LAB — TSH: TSH: 6.09 u[IU]/mL — ABNORMAL HIGH (ref 0.350–4.500)

## 2013-09-18 LAB — CBG MONITORING, ED: Glucose-Capillary: 134 mg/dL — ABNORMAL HIGH (ref 70–99)

## 2013-09-18 LAB — LACTIC ACID, PLASMA
Lactic Acid, Venous: 1.8 mmol/L (ref 0.5–2.2)
Lactic Acid, Venous: 1.4 mmol/L (ref 0.5–2.2)

## 2013-09-18 LAB — URINE MICROSCOPIC-ADD ON

## 2013-09-18 LAB — FIBRINOGEN: FIBRINOGEN: 376 mg/dL (ref 204–475)

## 2013-09-18 LAB — ABO/RH: ABO/RH(D): A POS

## 2013-09-18 LAB — TYPE AND SCREEN
ABO/RH(D): A POS
Antibody Screen: NEGATIVE

## 2013-09-18 LAB — T4, FREE: FREE T4: 1.8 ng/dL (ref 0.80–1.80)

## 2013-09-18 LAB — HEMOGLOBIN A1C
HEMOGLOBIN A1C: 7.6 % — AB (ref <5.7)
Mean Plasma Glucose: 171 mg/dL — ABNORMAL HIGH (ref <117)

## 2013-09-18 LAB — TROPONIN I
Troponin I: <0.3 ng/mL (ref <0.30)
Troponin I: <0.3 ng/mL (ref <0.30)

## 2013-09-18 LAB — CORTISOL: Cortisol, Plasma: 14.7 ug/dL

## 2013-09-18 LAB — PROTIME-INR
INR: 1.09 (ref 0.00–1.49)
PROTHROMBIN TIME: 14.1 s (ref 11.6–15.2)

## 2013-09-18 LAB — MRSA PCR SCREENING: MRSA by PCR: NEGATIVE

## 2013-09-18 MED ORDER — ACETAMINOPHEN 325 MG PO TABS: 650.0000 mg | ORAL_TABLET | Freq: Four times a day (QID) | ORAL | Status: DC | PRN

## 2013-09-18 MED ORDER — SODIUM CHLORIDE 0.9 % IV SOLN
INTRAVENOUS | Status: DC
  Administered 2013-09-18 – 2013-09-19 (×2): via INTRAVENOUS

## 2013-09-18 MED ORDER — HEPARIN SODIUM (PORCINE) 5000 UNIT/ML IJ SOLN
5000.0000 [IU] | Freq: Three times a day (TID) | INTRAMUSCULAR | Status: DC
  Administered 2013-09-18 – 2013-09-22 (×12): 5000 [IU] via SUBCUTANEOUS
  Filled 2013-09-18 (×18): qty 1

## 2013-09-18 MED ORDER — SODIUM CHLORIDE 0.9 % IV BOLUS (SEPSIS)
1000.0000 mL | INTRAVENOUS | Status: DC | PRN
  Administered 2013-09-18: 1000 mL via INTRAVENOUS

## 2013-09-18 MED ORDER — OXYCODONE HCL 5 MG PO TABS: 5.0000 mg | ORAL_TABLET | ORAL | Status: DC | PRN

## 2013-09-18 MED ORDER — ASPIRIN 81 MG PO CHEW
81.0000 mg | CHEWABLE_TABLET | Freq: Every day | ORAL | Status: DC
  Administered 2013-09-18 – 2013-09-22 (×5): 81 mg via ORAL
  Filled 2013-09-18 (×5): qty 1

## 2013-09-18 MED ORDER — ONDANSETRON HCL 4 MG PO TABS: 4.0000 mg | ORAL_TABLET | Freq: Four times a day (QID) | ORAL | Status: DC | PRN

## 2013-09-18 MED ORDER — SODIUM CHLORIDE 0.9 % IJ SOLN
3.0000 mL | Freq: Two times a day (BID) | INTRAMUSCULAR | Status: DC
  Administered 2013-09-18: 3 mL via INTRAVENOUS
  Administered 2013-09-18: 12:00:00 via INTRAVENOUS
  Administered 2013-09-19 – 2013-09-21 (×4): 3 mL via INTRAVENOUS

## 2013-09-18 MED ORDER — PIPERACILLIN-TAZOBACTAM 3.375 G IVPB 30 MIN
3.3750 g | INTRAVENOUS | Status: AC
  Administered 2013-09-18: 3.375 g via INTRAVENOUS
  Filled 2013-09-18: qty 50

## 2013-09-18 MED ORDER — SODIUM CHLORIDE 0.9 % IV SOLN
1250.0000 mg | INTRAVENOUS | Status: AC
  Administered 2013-09-18: 1250 mg via INTRAVENOUS
  Filled 2013-09-18: qty 1250

## 2013-09-18 MED ORDER — INSULIN ASPART 100 UNIT/ML ~~LOC~~ SOLN
0.0000 [IU] | Freq: Three times a day (TID) | SUBCUTANEOUS | Status: DC
  Administered 2013-09-18 – 2013-09-21 (×5): 2 [IU] via SUBCUTANEOUS
  Administered 2013-09-21 – 2013-09-22 (×2): 3 [IU] via SUBCUTANEOUS
  Administered 2013-09-22: 2 [IU] via SUBCUTANEOUS

## 2013-09-18 MED ORDER — PIPERACILLIN-TAZOBACTAM 3.375 G IVPB
3.3750 g | Freq: Three times a day (TID) | INTRAVENOUS | Status: DC
  Administered 2013-09-18 – 2013-09-22 (×11): 3.375 g via INTRAVENOUS
  Filled 2013-09-18 (×12): qty 50

## 2013-09-18 MED ORDER — VANCOMYCIN HCL IN DEXTROSE 750-5 MG/150ML-% IV SOLN
750.0000 mg | Freq: Two times a day (BID) | INTRAVENOUS | Status: DC
  Administered 2013-09-18 – 2013-09-20 (×4): 750 mg via INTRAVENOUS
  Filled 2013-09-18 (×5): qty 150

## 2013-09-18 MED ORDER — INSULIN ASPART 100 UNIT/ML ~~LOC~~ SOLN
0.0000 [IU] | Freq: Every day | SUBCUTANEOUS | Status: DC
  Administered 2013-09-19: 2 [IU] via SUBCUTANEOUS

## 2013-09-18 MED ORDER — ALUM & MAG HYDROXIDE-SIMETH 200-200-20 MG/5ML PO SUSP: 30.0000 mL | Freq: Four times a day (QID) | ORAL | Status: DC | PRN

## 2013-09-18 MED ORDER — MEMANTINE HCL ER 14 MG PO CP24: 14.0000 mg | ORAL_CAPSULE | Freq: Every day | ORAL | Status: DC

## 2013-09-18 MED ORDER — DONEPEZIL HCL 10 MG PO TABS
10.0000 mg | ORAL_TABLET | Freq: Every day | ORAL | Status: DC
  Administered 2013-09-18 – 2013-09-21 (×4): 10 mg via ORAL
  Filled 2013-09-18 (×6): qty 1

## 2013-09-18 MED ORDER — ONDANSETRON HCL 4 MG/2ML IJ SOLN: 4.0000 mg | Freq: Four times a day (QID) | INTRAMUSCULAR | Status: DC | PRN

## 2013-09-18 MED ORDER — SODIUM CHLORIDE 0.9 % IV SOLN: INTRAVENOUS | Status: DC

## 2013-09-18 MED ORDER — SODIUM CHLORIDE 0.9 % IV BOLUS (SEPSIS)
500.0000 mL | Freq: Once | INTRAVENOUS | Status: AC
  Administered 2013-09-18: 500 mL via INTRAVENOUS

## 2013-09-18 MED ORDER — ACETAMINOPHEN 650 MG RE SUPP: 650.0000 mg | Freq: Four times a day (QID) | RECTAL | Status: DC | PRN

## 2013-09-18 MED ORDER — MEMANTINE HCL ER 7 MG PO CP24
14.0000 mg | ORAL_CAPSULE | Freq: Every day | ORAL | Status: DC
  Administered 2013-09-18 – 2013-09-22 (×5): 14 mg via ORAL
  Filled 2013-09-18 (×6): qty 2

## 2013-09-18 NOTE — ED Notes (Addendum)
Dr. Romeo AppleHarrison notified by Verlon AuLeslie, NT of pt temp. Dr. Romeo AppleHarrison [facility] Bair hugger started on low 32C setting with warm blankets.

## 2013-09-18 NOTE — ED Notes (Signed)
Flow manager called and asked for report to not be called to floor until admitting MD sees patient in case level of care was changed.  She did not know patient had a bear hugger.

## 2013-09-18 NOTE — Progress Notes (Signed)
eLink Physician-Brief Progress Note Patient Name: Desiree Gonzalez DOB: 06-11-1923 MRN: [id number]  Date of Service  09/18/2013   HPI/Events of Note   Follow-up BP after bolus 130/70.   eICU Interventions   RN to change frequency of BP cuff to q15. Will monitor.   Intervention Category Intermediate Interventions: Hypotension - evaluation and management  Rashawn Rayman R. 09/18/2013, 4:16 PM

## 2013-09-18 NOTE — Progress Notes (Signed)
eLink Physician-Brief Progress Note Patient Name: Alveria Apleystelle M Oki DOB: Oct 03, 1923 MRN: [id number]  Date of Service  09/18/2013   HPI/Events of Note   Hypotension noted. ? UTI vs. Pneumonia-induced sepsis. Empiric antibiotics given.   eICU Interventions   Chart reviewed. Will initiate phase 1 sepsis protocol. Discussed with RN, she will provide bolus and if no improvement will let PCCM know and we will likely take over management and obtain central access after discussing with patient's daughter.      Intervention Category Major Interventions: Hypotension - evaluation and management  Polo Mcmartin R. 09/18/2013, 3:12 PM

## 2013-09-18 NOTE — Progress Notes (Signed)
ANTIBIOTIC CONSULT NOTE - INITIAL  Pharmacy Consult for Vancomycin and Zosyn Indication:Sepsis  No Known Allergies  Vital Signs: Temp: 93.4 F (34.1 C) (07/19 1004) Temp src: Rectal (07/19 1004) BP: 83/44 mmHg (07/19 1135) Pulse Rate: 71 (07/19 1045) Intake/Output from previous day:   Intake/Output from this shift:    Labs:  Recent Labs  09/18/13 0804  WBC 13.1*  HGB 12.4  PLT 231  CREATININE 1.08   The CrCl is unknown because both a height and weight (above a minimum accepted value) are required for this calculation. No results found for this basename: VANCOTROUGH, VANCOPEAK, VANCORANDOM, GENTTROUGH, GENTPEAK, GENTRANDOM, TOBRATROUGH, TOBRAPEAK, TOBRARND, AMIKACINPEAK, AMIKACINTROU, AMIKACIN,  in the last 72 hours   Microbiology: No results found for this or any previous visit (from the past 720 hour(s)).  Medical History: Past Medical History  Diagnosis Date  . CVA (cerebral infarction)   . Alzheimer's dementia   . GERD (gastroesophageal reflux disease)   . Hypertension   . Osteopenia   . Diabetes mellitus without complication     Medications:  Scheduled:  . aspirin  81 mg Oral Daily  . donepezil  10 mg Oral QHS  . heparin  5,000 Units Subcutaneous 3 times per day  . insulin aspart  0-15 Units Subcutaneous TID WC  . insulin aspart  0-5 Units Subcutaneous QHS  . Memantine HCl ER  14 mg Oral Daily  . sodium chloride  3 mL Intravenous Q12H   Assessment: 80 y.o. female with a past medical history of dementia, currently nursing home resident presents with syncopal episode.  Present on admission evidenced by hypothermia with rectal temperature of 93.4, white count of 13,100, acute encephalopathy, with the source of infection likely to be urinary tract infection. Pharmacy consulted to start broad spectrum emperic antibiotic therapy with IV Zosyn and Vacomycin    Goal of Therapy:   Vanc and zosyn per renal dosing guidelines  Eradication of infection  Plan:    Zosyn 3.375mg  IV q8h  Vancomycin 750mg  IV q12h (received 1250mg  in Ed)  Follow renal function and cultures  Vanco levels as needed  Maurice MarchJackson, Lemario Chaikin E 09/18/2013,11:53 AM

## 2013-09-18 NOTE — ED Provider Notes (Signed)
CSN: [id number]     Arrival date & time 09/18/13  0654 History   First MD Initiated Contact with Patient 09/18/13 0710     Chief Complaint  Patient presents with  . Loss of Consciousness     (Consider location/radiation/quality/duration/timing/severity/associated sxs/prior Treatment) Patient is a 80 y.o. female presenting with syncope. The history is provided by the EMS personnel.  Loss of Consciousness Episode history:  Single Most recent episode:  Today Timing:  Constant Progression:  Improving Chronicity:  New Context comment:  While sitting on the toilet Witnessed: yes   Relieved by:  Nothing Worsened by:  Nothing tried Ineffective treatments:  None tried Associated symptoms: no chest pain, no dizziness, no fever, no headaches, no nausea, no shortness of breath and no vomiting     Past Medical History  Diagnosis Date  . CVA (cerebral infarction)   . Alzheimer's dementia   . GERD (gastroesophageal reflux disease)   . Hypertension   . Osteopenia   . Diabetes mellitus without complication    No past surgical history on file. No family history on file. History  Substance Use Topics  . Smoking status: Never Smoker   . Smokeless tobacco: Not on file  . Alcohol Use: No   OB History   Grav Para Term Preterm Abortions TAB SAB Ect Mult Living                 Review of Systems  Constitutional: Negative for fever and fatigue.  HENT: Negative for congestion and drooling.   Eyes: Negative for pain.  Respiratory: Negative for cough and shortness of breath.   Cardiovascular: Positive for syncope. Negative for chest pain.  Gastrointestinal: Negative for nausea, vomiting, abdominal pain and diarrhea.  Genitourinary: Negative for dysuria and hematuria.  Musculoskeletal: Negative for back pain, gait problem and neck pain.  Skin: Negative for color change.  Neurological: Positive for syncope. Negative for dizziness and headaches.  Hematological: Negative for adenopathy.   Psychiatric/Behavioral: Negative for behavioral problems.  All other systems reviewed and are negative.     Allergies  Review of patient's allergies indicates no known allergies.  Home Medications   Prior to Admission medications   Medication Sig Start Date End Date Taking? Authorizing Provider  acetaminophen (TYLENOL) 500 MG tablet Take 1,000 mg by mouth every 6 (six) hours as needed. For pain    Historical Provider, MD  aspirin 81 MG chewable tablet Chew 81 mg by mouth daily.    Historical Provider, MD  donepezil (ARICEPT) 10 MG tablet Take 10 mg by mouth at bedtime.      Historical Provider, MD  glipiZIDE (GLUCOTROL XL) 10 MG 24 hr tablet Take 10 mg by mouth daily with breakfast.    Historical Provider, MD  hydrochlorothiazide (,MICROZIDE/HYDRODIURIL,) 12.5 MG capsule Take 12.5 mg by mouth daily.      Historical Provider, MD  hydrocortisone 2.5 % cream Apply 1 application topically 4 (four) times daily as needed (inflamed,itchy, painful hemorrhoids).  09/10/12   Historical Provider, MD  lisinopril (PRINIVIL,ZESTRIL) 10 MG tablet Take 10 mg by mouth daily.      Historical Provider, MD  metoprolol tartrate (LOPRESSOR) 25 MG tablet Take 1 tablet (25 mg total) by mouth 2 (two) times daily. 02/16/12   Leroy SeaPrashant K Singh, MD  psyllium (REGULOID) 0.52 G capsule Take 0.52 g by mouth daily.    Historical Provider, MD  vitamin D, CHOLECALCIFEROL, 400 UNITS tablet Take 400 Units by mouth daily.     Historical Provider, MD  BP 113/34  Pulse 72  Resp 17  SpO2 92% Physical Exam  Nursing note and vitals reviewed. Constitutional: She appears well-developed and well-nourished.  HENT:  Head: Normocephalic.  Mouth/Throat: Oropharynx is clear and moist. No oropharyngeal exudate.  Eyes: Conjunctivae and EOM are normal. Pupils are equal, round, and reactive to light.  Neck: Normal range of motion. Neck supple.  Cardiovascular: Normal rate, regular rhythm, normal heart sounds and intact distal  pulses.  Exam reveals no gallop and no friction rub.   No murmur heard. Pulmonary/Chest: Effort normal and breath sounds normal. No respiratory distress. She has no wheezes.  Abdominal: Soft. Bowel sounds are normal. There is no tenderness. There is no rebound and no guarding.  Musculoskeletal: Normal range of motion. She exhibits no edema and no tenderness.  Neurological: She is alert.  A/o x1. Pt will follow most commands.   speech: normal in context and clarity cranial nerves II-XII: intact motor strength: full proximally and distally sensation: intact to light touch diffusely  Cerebellar:would not comply w/ cerebellar testing   Skin: Skin is warm and dry.  Psychiatric: She has a normal mood and affect. Her behavior is normal.    ED Course  Procedures (including critical care time) Labs Review Labs Reviewed  CBC WITH DIFFERENTIAL - Abnormal; Notable for the following:    WBC 13.1 (*)    Neutrophils Relative % 86 (*)    Neutro Abs 11.1 (*)    Lymphocytes Relative 9 (*)    All other components within normal limits  COMPREHENSIVE METABOLIC PANEL - Abnormal; Notable for the following:    Sodium 134 (*)    Glucose, Bld 157 (*)    BUN 27 (*)    Albumin 3.0 (*)    Total Bilirubin <0.2 (*)    GFR calc non Af Amer 44 (*)    GFR calc Af Amer 52 (*)    All other components within normal limits  URINALYSIS, ROUTINE W REFLEX MICROSCOPIC - Abnormal; Notable for the following:    Nitrite POSITIVE (*)    Leukocytes, UA SMALL (*)    All other components within normal limits  URINE MICROSCOPIC-ADD ON - Abnormal; Notable for the following:    Bacteria, UA MANY (*)    Casts HYALINE CASTS (*)    All other components within normal limits  CBG MONITORING, ED - Abnormal; Notable for the following:    Glucose-Capillary 134 (*)    All other components within normal limits  CULTURE, BLOOD (ROUTINE X 2)  CULTURE, BLOOD (ROUTINE X 2)  URINE CULTURE  TROPONIN I  T4, FREE  TSH  LACTIC ACID,  PLASMA  CORTISOL    Imaging Review Dg Chest 2 View  09/18/2013   CLINICAL DATA:  Syncope.  History of hypertension.  EXAM: CHEST  2 VIEW  COMPARISON:  02/11/2012  FINDINGS: Interstitial markings are thickened but stable. No lung consolidation or edema. No pleural effusion or pneumothorax.  Cardiac silhouette is normal in size. No mediastinal or hilar masses.  Bony thorax is demineralized. There is a severe mid thoracic compression fracture and a moderate compression fracture of the upper lumbar spine, both stable.  IMPRESSION: No acute cardiopulmonary disease.   Electronically Signed   By: Amie Portland M.D.   On: 09/18/2013 07:46   Ct Head Wo Contrast  09/18/2013   CLINICAL DATA:  Syncopal episode.  EXAM: CT HEAD WITHOUT CONTRAST  TECHNIQUE: Contiguous axial images were obtained from the base of the skull through the vertex  without intravenous contrast.  COMPARISON:  CT, 02/20/2010.  MRI, 02/21/2010.  FINDINGS: Ventricles are normal in overall configuration. There is ex vacuo dilation of the right lateral ventricle from an old right basal ganglia to right centrum semiovale infarct. No evidence of hydrocephalus. Ventricular and sulcal enlargement reflects mild to moderate atrophy.  No parenchymal masses or mass effect. White matter hypoattenuation is noted diffusely consistent with moderate chronic microvascular ischemic change.  No evidence of a recent infarct.  No extra-axial masses or abnormal fluid collections.  No intracranial hemorrhage.  Visualized sinuses and mastoid air cells are clear. No skull lesion.  IMPRESSION: 1. No acute intracranial abnormalities. 2. Old infarct, atrophy and moderate chronic microvascular ischemic change.   Electronically Signed   By: Amie Portland M.D.   On: 09/18/2013 07:55     EKG Interpretation   Date/Time:  Sunday September 18 2013 07:13:38 EDT Ventricular Rate:  70 PR Interval:  215 QRS Duration: 119 QT Interval:  481 QTC Calculation: 519 R Axis:   12 Text  Interpretation:  Sinus rhythm Borderline prolonged PR interval  Incomplete left bundle branch block No significant change since last  tracing Confirmed by Zulema Pulaski  MD, Latoya Maulding ([id number]) on 09/18/2013 7:25:48 AM      MDM   Final diagnoses:  Hypothermia, initial encounter  Syncope, unspecified syncope type  UTI (lower urinary tract infection)  Sepsis, due to unspecified organism    7:27 AM 80 y.o. female with a history of CVA, diabetes, hypertension who presents with a syncopal event that occurred sometime between 5-7AM. I called [facility] and discuss the case with her nurse [place]. She denied rectally witnessed the incident but got report from the previous shift. She states that the patient was taken to the bathroom and slumped over while using the toilet. Per report she had  a decreased level of consciousness afterwards. EMS reports that they assisted her ventilations pressure protime in route. On exam she is alert and oriented x1. She does not appear in any acute distress. She denies any pain. She is moving all extremities. At baseline her nurse reports that she has some dementia and being alert and oriented x1 is not unusual. She states that she can walk and feed herself. She typically will follow commands with ease. Vital signs are unremarkable here. Will get screening labs and imaging.   Nursing notified me of temp of 93.1. Pt meets SIRS criteria d/t hypothermia and wbc count. Will get blood cx's and start broad spectrum abx. Will place heating blanket and add on thyroid studies, lactate, and cortisol.   9:40 AM I spoke w/ the pt's daughter, Elson Clan, who confirmed that the pt is DNR.   Will admit to hospitalist.   Junius Argyle, MD 09/18/13 [phone]

## 2013-09-18 NOTE — ED Notes (Signed)
[facility], she was sitting on toilet and her caregiver turned around for a minute pt passed out and was unresponsive and cold when EMS got there,  EMS assisted ventilations

## 2013-09-18 NOTE — H&P (Signed)
[facility] History and Physical  Desiree Gonzalez ZOX:[id number] DOB: [birth year] DOA: 09/18/2013  Referring physician:  PCP: No primary provider on file.   Chief Complaint: Syncope/Mental Status Changes  HPI: Desiree Gonzalez is a 80 y.o. female with a past medical history of dementia, currently nursing home resident who was [facility] emergency room [facility]  After having a syncopal episode. Patient with dementia can not provide history or participate in her plan of care. History of obtained from emergency room staff.  She went to use the restroom this morning a found her slumped over. Unclear if there was loss of consciousness, however nursing home staff reported her to have decreased responsiveness and minimally verbal. EMS called. In the emergency room she found to be hypothermic with temperature of 93.4. Labs showed an elevated white count of 13,100 and U/A suggestive of UTI. She was administered IV fluids and IV Zosyn and Vancomycin.                                                                                                                                                                                                                                             Review of Systems:  Cannot obtain reliable review of systems due to encephalopathy  Past Medical History  Diagnosis Date  . CVA (cerebral infarction)   . Alzheimer's dementia   . GERD (gastroesophageal reflux disease)   . Hypertension   . Osteopenia   . Diabetes mellitus without complication    No past surgical history on file. Social History:  reports that she has never smoked. She does not have any smokeless tobacco history on file. She reports that she does not drink alcohol or use illicit drugs.  No Known Allergies  No family history on file.   Prior to Admission medications   Medication Sig Start Date End Date Taking? Authorizing Provider  acetaminophen (TYLENOL) 500 MG tablet Take  1,000 mg by mouth every 6 (six) hours as needed. For pain   Yes Historical Provider, MD  aspirin 81 MG chewable tablet Chew 81 mg by mouth daily.   Yes Historical Provider, MD  donepezil (ARICEPT) 10 MG tablet Take 10 mg by mouth at bedtime.     Yes Historical Provider, MD  glipiZIDE (GLUCOTROL XL) 10 MG 24 hr tablet Take 10 mg by mouth daily with breakfast.  Yes Historical Provider, MD  hydrochlorothiazide (,MICROZIDE/HYDRODIURIL,) 12.5 MG capsule Take 12.5 mg by mouth daily.     Yes Historical Provider, MD  hydrocortisone 2.5 % cream Apply 1 application topically 4 (four) times daily as needed (inflamed,itchy, painful hemorrhoids).  09/10/12  Yes Historical Provider, MD  lisinopril (PRINIVIL,ZESTRIL) 10 MG tablet Take 10 mg by mouth daily.     Yes Historical Provider, MD  Memantine HCl ER (NAMENDA XR) 14 MG CP24 Take 14 mg by mouth daily.   Yes Historical Provider, MD  metoprolol tartrate (LOPRESSOR) 25 MG tablet Take 1 tablet (25 mg total) by mouth 2 (two) times daily. 02/16/12  Yes Leroy Sea, MD  Miconazole Nitrate (EQL ATHLETES FOOT POWDER SPRAY) 2 % AERP Apply 1 application topically 3 (three) times daily.   Yes Historical Provider, MD  psyllium (REGULOID) 0.52 G capsule Take 0.52 g by mouth daily.   Yes Historical Provider, MD  vitamin D, CHOLECALCIFEROL, 400 UNITS tablet Take 400 Units by mouth daily.    Yes Historical Provider, MD   Physical Exam: Filed Vitals:   09/18/13 1004 09/18/13 1015 09/18/13 1030 09/18/13 1045  BP:  129/57 96/55 106/54  Pulse:  64 62 71  Temp: 93.4 F (34.1 C)     TempSrc: Rectal     Resp:  18 12 16   SpO2:  96% 98% 98%    Wt Readings from Last 3 Encounters:  02/11/12 86.2 kg (190 lb 0.6 oz)    General:  Ill appearing, in no acute distress. She is confused disoriented, however was arousable and could follow simple commands Eyes: PERRL, normal lids, irises & conjunctiva ENT: grossly normal hearing, lips & tongue, dry oral mucosa Neck: no LAD,  masses or thyromegaly Cardiovascular: RRR, no m/r/g. No LE edema. Telemetry: SR, no arrhythmias  Respiratory: CTA bilaterally, no w/r/r. Normal respiratory effort. Abdomen: soft, ntnd Skin: no rash or induration seen on limited exam Musculoskeletal: grossly normal tone BUE/BLE Psychiatric: grossly normal mood and affect, speech fluent and appropriate Neurologic: grossly non-focal.          Labs on Admission:  Basic Metabolic Panel:  Recent Labs Lab 09/18/13 0804  NA 134*  K 3.9  CL 96  CO2 24  GLUCOSE 157*  BUN 27*  CREATININE 1.08  CALCIUM 9.3   Liver Function Tests:  Recent Labs Lab 09/18/13 0804  AST 22  ALT 22  ALKPHOS 103  BILITOT <0.2*  PROT 6.9  ALBUMIN 3.0*   No results found for this basename: LIPASE, AMYLASE,  in the last 168 hours No results found for this basename: AMMONIA,  in the last 168 hours CBC:  Recent Labs Lab 09/18/13 0804  WBC 13.1*  NEUTROABS 11.1*  HGB 12.4  HCT 36.8  MCV 83.6  PLT 231   Cardiac Enzymes:  Recent Labs Lab 09/18/13 0804  TROPONINI <0.30    BNP (last 3 results) No results found for this basename: PROBNP,  in the last 8760 hours CBG:  Recent Labs Lab 09/18/13 0729  GLUCAP 134*    Radiological Exams on Admission: Dg Chest 2 View  09/18/2013   CLINICAL DATA:  Syncope.  History of hypertension.  EXAM: CHEST  2 VIEW  COMPARISON:  02/11/2012  FINDINGS: Interstitial markings are thickened but stable. No lung consolidation or edema. No pleural effusion or pneumothorax.  Cardiac silhouette is normal in size. No mediastinal or hilar masses.  Bony thorax is demineralized. There is a severe mid thoracic compression fracture and a moderate compression  fracture of the upper lumbar spine, both stable.  IMPRESSION: No acute cardiopulmonary disease.   Electronically Signed   By: Amie Portland M.D.   On: 09/18/2013 07:46   Ct Head Wo Contrast  09/18/2013   CLINICAL DATA:  Syncopal episode.  EXAM: CT HEAD WITHOUT CONTRAST   TECHNIQUE: Contiguous axial images were obtained from the base of the skull through the vertex without intravenous contrast.  COMPARISON:  CT, 02/20/2010.  MRI, 02/21/2010.  FINDINGS: Ventricles are normal in overall configuration. There is ex vacuo dilation of the right lateral ventricle from an old right basal ganglia to right centrum semiovale infarct. No evidence of hydrocephalus. Ventricular and sulcal enlargement reflects mild to moderate atrophy.  No parenchymal masses or mass effect. White matter hypoattenuation is noted diffusely consistent with moderate chronic microvascular ischemic change.  No evidence of a recent infarct.  No extra-axial masses or abnormal fluid collections.  No intracranial hemorrhage.  Visualized sinuses and mastoid air cells are clear. No skull lesion.  IMPRESSION: 1. No acute intracranial abnormalities. 2. Old infarct, atrophy and moderate chronic microvascular ischemic change.   Electronically Signed   By: Amie Portland M.D.   On: 09/18/2013 07:55    EKG: Independently reviewed.   Assessment/Plan Principal Problem:   Sepsis Active Problems:   Urinary tract infection   Acute encephalopathy   FTT (failure to thrive) in adult   Dementia   DM (diabetes mellitus)   Syncope   1. Sepsis. Present on admission evidenced by hypothermia with rectal temperature of 93.4, white count of 13,100, acute encephalopathy, with the source of infection likely to be urinary tract infection. Will start broad spectrum emperic antibiotic therapy with IV Zosyn and Vacomycin, pharmacy consultation for renal dosing. Will continue IV fluid resuscitation with NS, monitor ins and out, follow blood cultures and urine cultures. Will admit to Step-Down Unit. 2. Possible syncope. Nursing home staff finding her slumped over on the toilet, unsure if there was loss of consciousness. It think this is likely related to encephalopathy resulted from sepsis. Will cycle cardiac enzymes and place her on  telemetry although at this point it seems improbable that she had a syncope episode of heart origin. EKG did not show acute changes.  3. Acute Encephalopathy. Likely secondary to sepsis. Will provide supportive care, IV fluids, and emperic antibiotic therapy. Physical therapy consult when stable  4. Urinary Tract Infection. U/A on admission showing presence of bacteria and nitrites. Has history of ESBL E.coli based on urine culture from 12/11/2012. Organism was susceptible to Zosyn per sensitivities report then. Will treat with Zosyn for now, follow up on urine cultures.  5. Diabetes Mellitus. Will hold oral hypoglycemic therapy, place her on sliding scale coverage with accuchecks.  6. History of Hypertension. Will hold Metoprolol and Lisinopril given low blood pressures in the emergency room 7.  DVT prophylaxis: Heparin [place]  Code Status: DNR Family Communication: I spoke with her daughter over telephone, confirmed she is a DNR Disposition Plan: Will admit to step down, anticipateshe will require greater than 2 nights hospitalization  Time spent: 70 min  Jeralyn Bennett [facility] Pager [phone]  **Disclaimer: This note may have been dictated with voice recognition software. Similar sounding words can inadvertently be transcribed and this note may contain transcription errors which may not have been corrected upon publication of note.**

## 2013-09-18 NOTE — ED Notes (Signed)
[place] 139 

## 2013-09-18 NOTE — ED Notes (Signed)
Patient transported to X-ray 

## 2013-09-19 LAB — BASIC METABOLIC PANEL
Anion gap: 11 (ref 5–15)
BUN: 22 mg/dL (ref 6–23)
CALCIUM: 8.4 mg/dL (ref 8.4–10.5)
CO2: 23 mEq/L (ref 19–32)
CREATININE: 1.14 mg/dL — AB (ref 0.50–1.10)
Chloride: 104 mEq/L (ref 96–112)
GFR calc Af Amer: 48 mL/min — ABNORMAL LOW (ref >90)
GFR, EST NON AFRICAN AMERICAN: 42 mL/min — AB (ref >90)
GLUCOSE: 102 mg/dL — AB (ref 70–99)
Potassium: 4.3 mEq/L (ref 3.7–5.3)
SODIUM: 138 meq/L (ref 137–147)

## 2013-09-19 LAB — GLUCOSE, CAPILLARY
GLUCOSE-CAPILLARY: 117 mg/dL — AB (ref 70–99)
GLUCOSE-CAPILLARY: 148 mg/dL — AB (ref 70–99)
Glucose-Capillary: 83 mg/dL (ref 70–99)

## 2013-09-19 LAB — CBC
HCT: 31.5 % — ABNORMAL LOW (ref 36.0–46.0)
Hemoglobin: 10.6 g/dL — ABNORMAL LOW (ref 12.0–15.0)
MCH: 28.3 pg (ref 26.0–34.0)
MCHC: 33.7 g/dL (ref 30.0–36.0)
MCV: 84 fL (ref 78.0–100.0)
Platelets: 233 10*3/uL (ref 150–400)
RBC: 3.75 MIL/uL — ABNORMAL LOW (ref 3.87–5.11)
RDW: 13.8 % (ref 11.5–15.5)
WBC: 8 10*3/uL (ref 4.0–10.5)

## 2013-09-19 LAB — TROPONIN I

## 2013-09-19 NOTE — Progress Notes (Signed)
INITIAL NUTRITION ASSESSMENT  DOCUMENTATION CODES Per approved criteria  -Obesity Unspecified   INTERVENTION: -Recommend chopped meats and vegetables -Will continue to monitor; implement supplement regimen as warranted   NUTRITION DIAGNOSIS: Inadequate oral intake related to confusion/dementia as evidenced by PO 50%.   Goal: Pt to meet >/= 90% of their estimated nutrition needs    Monitor:  Total protein/energy intake, labs, weights, swallow profile  Reason for Assessment: MST  80 y.o. female  Admitting Dx: Sepsis  ASSESSMENT: Desiree Gonzalez is a 80 y.o. female with a past medical history of dementia, currently nursing home resident who was [facility] After having a syncopal episode. Patient with dementia can not provide history or participate in her plan of care. History of obtained from emergency room staff. She went to use the restroom this morning a found her slumped over  [facility] unable to provide food/nutrition related hx. Was sleeping during time of assessment and w/hx of dementia -SNF reported pt with stable weight and denied changes in appetite. Pt requires meats and vegetables to be chopped/cut up for her as she is at risk for consuming large pieces of food that pose a choking hazard -SNF encourages pt to feed self with implementing appropriate feeding cues. Pt not on nutrition supplement as pt able to maintain nutritional status via meal intake -Current PO intake 50%. Was able to feed self -MD noted pt with functional decline. RD to continue to monitor PO intake need to implement supplement regimen  Height: Ht Readings from Last 1 Encounters:  09/18/13 5\' 7"  (1.702 m)    Weight: Wt Readings from Last 1 Encounters:  09/19/13 205 lb 7.5 oz (93.2 kg)    Ideal Body Weight: 135 lbs  % Ideal Body Weight: 152%  Wt Readings from Last 10 Encounters:  09/19/13 205 lb 7.5 oz (93.2 kg)  02/11/12  190 lb 0.6 oz (86.2 kg)    Usual Body Weight: 205 lbs  % Usual Body Weight: 100%  BMI:  Body mass index is 32.17 kg/(m^2).  Estimated Nutritional Needs: Kcal: 1650-1850 Protein: 85-95 gram Fluid: >/=1650 ml/daily  Skin: WDL  Diet Order:    EDUCATION NEEDS: -No education needs identified at this time   Intake/Output Summary (Last 24 hours) at 09/19/13 1439 Last data filed at 09/19/13 1340  Gross per 24 hour  Intake 2694.17 ml  Output   1425 ml  Net 1269.17 ml    Last BM: pta   Labs:   Recent Labs Lab 09/18/13 0804 09/19/13 0040  NA 134* 138  K 3.9 4.3  CL 96 104  CO2 24 23  BUN 27* 22  CREATININE 1.08 1.14*  CALCIUM 9.3 8.4  GLUCOSE 157* 102*    CBG (last 3)   Recent Labs  09/18/13 2133 09/19/13 0805 09/19/13 1248  GLUCAP 142* 117* 148*    Scheduled Meds: . aspirin  81 mg Oral Daily  . donepezil  10 mg Oral QHS  . heparin  5,000 Units Subcutaneous 3 times per day  . insulin aspart  0-15 Units Subcutaneous TID WC  . insulin aspart  0-5 Units Subcutaneous QHS  . Memantine HCl ER  14 mg Oral Daily  . piperacillin-tazobactam (ZOSYN)  IV  3.375 g Intravenous 3 times per day  . sodium chloride  3 mL Intravenous Q12H  . vancomycin  750 mg Intravenous Q12H    Continuous Infusions:   Past Medical History  Diagnosis Date  .  CVA (cerebral infarction)   . Alzheimer's dementia   . GERD (gastroesophageal reflux disease)   . Hypertension   . Osteopenia   . Diabetes mellitus without complication     History reviewed. No pertinent past surgical history.  Lloyd Huger MS RD LDN Clinical Dietitian Pager:[phone]

## 2013-09-19 NOTE — Plan of Care (Signed)
Problem: Phase II Progression Outcomes Goal: Tolerating diet Outcome: Adequate for Discharge Pt needs to be feed

## 2013-09-19 NOTE — Plan of Care (Signed)
Problem: Phase II Progression Outcomes Goal: Voiding independently Outcome: Progressing Has foley

## 2013-09-19 NOTE — Progress Notes (Signed)
Pt pleasantly confused at times and sometimes answers questions appropriately. Pt transferred from ICU. She is a feeder.Appetite good. Sleeping at intervals. Bed alarm on.

## 2013-09-19 NOTE — Progress Notes (Signed)
[facility] PROGRESS NOTE  Desiree Gonzalez VHQ:[id number] DOB: 04-Jun-1923 DOA: 09/18/2013 PCP: No primary provider on file.  Assessment/Plan: 1. Sepsis -Present on admission, evidenced by hypotension BP 83/44, hypothermia with temperature of 93.4, encephalopathy -Source of infection UTI. Initial CXR did not show acute cardiopulmonary disease.  -She was started on emperic antibiotic therapy with Vancomycin and Zosyn.  -Showing improvement overnight with IV fluids and IV Ab's -Blood cultures and Urine cultures pending. She has history of ESBL E. Coli from urine cultures drawn on 12/12/2102  2. UTI -Patient with history of  ESBL E. Coli from urine cultures drawn on 12/12/2102, susceptibilities testing then showed organism to be sensitive to Zosyn.  -Urine culture is pending -Will continue IV Zosyn for now until cultures are resulted.   3. Acute Encephalopathy  -Improved, patient was awake and alert, feeding herself this morning -Likely secondary to sepsis -Physical Therapy Consult  4.  Diabetes mellitus.  -Blood sugars are stable. [place]la hypoglycemics held as she is presently on sliding scale coverage.   5. Hypertension. -Metoprolol and lisinopril held due to hypotension  Code Status: DNR Family Communication: Spoke with patient's daughter over telephone Disposition Plan: Patient stable will transfer to med/surg    Antibiotics:  Vancomycin (Started 09/18/2013)  Zosyn (Started 09/18/2013)  HPI/Subjective: Desiree Gonzalez is a 80 y.o. female with a past medical history of dementia, currently nursing home resident who [facility] 09[facility]18/2013 having a steep functional decline. She was found to be septic. Started on IV Vancomycin and Zosyn and admitted to the step-down unit.     Objective: Filed Vitals:   09/19/13 0800  BP: 118/72  Pulse:   Temp: 96.8 F (36 C)  Resp:     Intake/Output Summary (Last 24 hours) at  09/19/13 0929 Last data filed at 09/19/13 0800  Gross per 24 hour  Intake 2522.5 ml  Output   1200 ml  Net 1322.5 ml   Filed Weights   09/18/13 1225 09/19/13 0400  Weight: 90.1 kg (198 lb 10.2 oz) 93.2 kg (205 lb 7.5 oz)    Exam:   General:  Patient is sitting up feeding herself, in no acute distress, awake and alert  Cardiovascular: Regular rate and rhythm normal S1S2  Respiratory: Normal inspiratory effort, lungs are clear to auscultion  Abdomen: Soft, nontender, nondistended  Musculoskeletal: No edema   Data Reviewed: Basic Metabolic Panel:  Recent Labs Lab 09/18/13 0804 09/19/13 0040  NA 134* 138  K 3.9 4.3  CL 96 104  CO2 24 23  GLUCOSE 157* 102*  BUN 27* 22  CREATININE 1.08 1.14*  CALCIUM 9.3 8.4   Liver Function Tests:  Recent Labs Lab 09/18/13 0804  AST 22  ALT 22  ALKPHOS 103  BILITOT <0.2*  PROT 6.9  ALBUMIN 3.0*   No results found for this basename: LIPASE, AMYLASE,  in the last 168 hours No results found for this basename: AMMONIA,  in the last 168 hours CBC:  Recent Labs Lab 09/18/13 0804 09/19/13 0040  WBC 13.1* 8.0  NEUTROABS 11.1*  --   HGB 12.4 10.6*  HCT 36.8 31.5*  MCV 83.6 84.0  PLT 231 233   Cardiac Enzymes:  Recent Labs Lab 09/18/13 0804 09/18/13 1246 09/18/13 1852 09/19/13 0040  TROPONINI <0.30 <0.30 <0.30 <0.30   BNP (last 3 results) No results found for this basename: PROBNP,  in the last 8760 hours CBG:  Recent Labs Lab 09/18/13 0729 09/18/13  1228 09/18/13 1631 09/18/13 2133 09/19/13 0805  GLUCAP 134* 149* 90 142* 117*    Recent Results (from the past 240 hour(s))  MRSA PCR SCREENING     Status: None   Collection Time    09/18/13 12:17 PM      Result Value Ref Range Status   MRSA by PCR NEGATIVE  NEGATIVE Final   Comment:            The GeneXpert MRSA Assay (FDA     approved for NASAL specimens     only), is one component of a     comprehensive MRSA colonization     surveillance program.  It is not     intended to diagnose MRSA     infection nor to guide or     monitor treatment for     MRSA infections.     Studies: Dg Chest 2 View  09/18/2013   CLINICAL DATA:  Syncope.  History of hypertension.  EXAM: CHEST  2 VIEW  COMPARISON:  02/11/2012  FINDINGS: Interstitial markings are thickened but stable. No lung consolidation or edema. No pleural effusion or pneumothorax.  Cardiac silhouette is normal in size. No mediastinal or hilar masses.  Bony thorax is demineralized. There is a severe mid thoracic compression fracture and a moderate compression fracture of the upper lumbar spine, both stable.  IMPRESSION: No acute cardiopulmonary disease.   Electronically Signed   By: Amie Portland M.D.   On: 09/18/2013 07:46   Ct Head Wo Contrast  09/18/2013   CLINICAL DATA:  Syncopal episode.  EXAM: CT HEAD WITHOUT CONTRAST  TECHNIQUE: Contiguous axial images were obtained from the base of the skull through the vertex without intravenous contrast.  COMPARISON:  CT, 02/20/2010.  MRI, 02/21/2010.  FINDINGS: Ventricles are normal in overall configuration. There is ex vacuo dilation of the right lateral ventricle from an old right basal ganglia to right centrum semiovale infarct. No evidence of hydrocephalus. Ventricular and sulcal enlargement reflects mild to moderate atrophy.  No parenchymal masses or mass effect. White matter hypoattenuation is noted diffusely consistent with moderate chronic microvascular ischemic change.  No evidence of a recent infarct.  No extra-axial masses or abnormal fluid collections.  No intracranial hemorrhage.  Visualized sinuses and mastoid air cells are clear. No skull lesion.  IMPRESSION: 1. No acute intracranial abnormalities. 2. Old infarct, atrophy and moderate chronic microvascular ischemic change.   Electronically Signed   By: Amie Portland M.D.   On: 09/18/2013 07:55    Scheduled Meds: . aspirin  81 mg Oral Daily  . donepezil  10 mg Oral QHS  . heparin  5,000  Units Subcutaneous 3 times per day  . insulin aspart  0-15 Units Subcutaneous TID WC  . insulin aspart  0-5 Units Subcutaneous QHS  . Memantine HCl ER  14 mg Oral Daily  . piperacillin-tazobactam (ZOSYN)  IV  3.375 g Intravenous 3 times per day  . sodium chloride  3 mL Intravenous Q12H  . vancomycin  750 mg Intravenous Q12H   Continuous Infusions: . sodium chloride 75 mL/hr at 09/19/13 1610    Principal Problem:   Sepsis Active Problems:   Urinary tract infection   Acute encephalopathy   FTT (failure to thrive) in adult   Dementia   DM (diabetes mellitus)   Syncope    Time spent: 35 min    Jeralyn Bennett  [facility] Pager [phone]. If 7PM-7AM, please contact night-coverage at [email or web address], password [facility] 09/19/2013, 9:29 AM  LOS: 1 day

## 2013-09-20 LAB — BASIC METABOLIC PANEL
Anion gap: 10 (ref 5–15)
BUN: 17 mg/dL (ref 6–23)
CALCIUM: 8.7 mg/dL (ref 8.4–10.5)
CO2: 24 meq/L (ref 19–32)
Chloride: 101 mEq/L (ref 96–112)
Creatinine, Ser: 1.15 mg/dL — ABNORMAL HIGH (ref 0.50–1.10)
GFR calc Af Amer: 48 mL/min — ABNORMAL LOW (ref >90)
GFR, EST NON AFRICAN AMERICAN: 41 mL/min — AB (ref >90)
GLUCOSE: 132 mg/dL — AB (ref 70–99)
Potassium: 4.2 mEq/L (ref 3.7–5.3)
Sodium: 135 mEq/L — ABNORMAL LOW (ref 137–147)

## 2013-09-20 LAB — GLUCOSE, CAPILLARY
GLUCOSE-CAPILLARY: 144 mg/dL — AB (ref 70–99)
Glucose-Capillary: 223 mg/dL — ABNORMAL HIGH (ref 70–99)
Glucose-Capillary: 141 mg/dL — ABNORMAL HIGH (ref 70–99)
Glucose-Capillary: 113 mg/dL — ABNORMAL HIGH (ref 70–99)
Glucose-Capillary: 173 mg/dL — ABNORMAL HIGH (ref 70–99)

## 2013-09-20 LAB — CBC
HCT: 31.1 % — ABNORMAL LOW (ref 36.0–46.0)
Hemoglobin: 10.2 g/dL — ABNORMAL LOW (ref 12.0–15.0)
MCH: 28 pg (ref 26.0–34.0)
MCHC: 32.8 g/dL (ref 30.0–36.0)
MCV: 85.4 fL (ref 78.0–100.0)
PLATELETS: 190 10*3/uL (ref 150–400)
RBC: 3.64 MIL/uL — ABNORMAL LOW (ref 3.87–5.11)
RDW: 13.9 % (ref 11.5–15.5)
WBC: 6.4 10*3/uL (ref 4.0–10.5)

## 2013-09-20 MED ORDER — METOPROLOL TARTRATE 25 MG PO TABS
25.0000 mg | ORAL_TABLET | Freq: Two times a day (BID) | ORAL | Status: DC
  Administered 2013-09-20 – 2013-09-22 (×5): 25 mg via ORAL
  Filled 2013-09-20 (×6): qty 1

## 2013-09-20 NOTE — Care Management Note (Addendum)
    Page 1 of 1   09/22/2013     1:37:34 PM CARE MANAGEMENT NOTE 09/22/2013  Patient:  Desiree Gonzalez,Desiree Gonzalez   Account Number:  [id number]  Date Initiated:  09/20/2013  Documentation initiated by:  [facility],Brynnley Dayrit  Subjective/Objective Assessment:   adm: Sepsis     Action/Plan:   discharge planning   Anticipated DC Date:  09/23/2013   Anticipated DC Plan:  ASSISTED LIVING / REST HOME      DC Planning Services  CM consult      Choice offered to / List presented to:             Status of service:  Completed, signed off Medicare Important Message given?  YES (If response is "NO", the following Medicare IM given date fields will be blank) Date Medicare IM given:  09/20/2013 Medicare IM given by:   Date Additional Medicare IM given:   Additional Medicare IM given by:    Discharge Disposition:  ASSISTED LIVING  Per UR Regulation:  Reviewed for med. necessity/level of care/duration of stay  If discussed at Long Length of Stay Meetings, dates discussed:    Comments:  09/22/13 13:20 CM notes MD has placed HH oders which will be included on discharge.  Pt's ALF provides  PT/OT/SLP.  CSW aware and orders included with discharge orders.  No other CM needs were communicated.  Desiree JakschSarah Robinette Gonzalez, BSN, Caryl AdaM [phone].  09/20/13 12:30 CM [facility] [place] and is being followed by CSW for disposition. Support is daughter, Desiree Gonzalez [phone].  Desiree Gonzalez, BSN, CM [phone].

## 2013-09-20 NOTE — Progress Notes (Signed)
[facility] PROGRESS NOTE  Desiree Gonzalez ZOX:[id number] DOB: 1923-12-27 DOA: 09/18/2013 PCP: No primary provider on file.  Interim Summery Patient is a pleasant 80 year old female with a past medical history of advanced dementia, presently a nursing home resident, who was admitted to the medicine service on 09/18/2013 presenting with mental status changes. On admission she was found to be septic as evidenced by hypothermia having a rectal temperature of 93.4, white count of 13,100, acute encephalopathy with a source of infection likely to be urinary tract infection. She was started on broad-spectrum empiric IV antibiotic therapy with IV Zosyn and vancomycin and admitted to the step down unit. She has a history of ESBL Escherichia coli based on a urine culture from 12/11/2012. Organism was susceptible to Zosyn per sensitivities report then. Patient showing clinical improvement as she was transferred out of the step down unit on 09/19/2013 to MedSurg. Physical therapy consultation pending. Urine culture growing gram-negative rods, awaiting organism identification and susceptibility testing. Zosyn has been can continued as vancomycin DC'd.                                                                                                                                                                                                                                       Assessment/Plan: 1. Sepsis -Present on admission, evidenced by hypotension BP 83/44, hypothermia with temperature of 93.4, encephalopathy -Source of infection UTI. Initial CXR did not show acute cardiopulmonary disease.  -She was started on emperic antibiotic therapy with Vancomycin and Zosyn.  -Showing improvement overnight with IV fluids and IV Ab's -She has history of ESBL E. Coli from urine cultures drawn on 12/12/2102 -Urine Culture growing gram negative rod, awaiting organism identification and susceptibility testing. Will simplify  her antibiotic regimen by discontinuing vancomycin.  2. UTI -Patient with history of  ESBL E. Coli from urine cultures drawn on 12/12/2102, susceptibilities testing then showed organism to be sensitive to Zosyn.  -Awaiting urine culture finalization -Will continue IV Zosyn for now until cultures are resulted.   3. Acute Encephalopathy  -Improved, patient was awake and alert, feeding herself this morning -Likely secondary to sepsis -Physical Therapy Consult  4.  Diabetes mellitus.  -Blood sugars are stable. Orla hypoglycemics held as she is presently on sliding scale coverage.   5. Hypertension. -Metoprolol and lisinopril have been held due to hypotension -Blood pressures increasing, will restart metoprolol at 25 mg by  mouth twice a day  Code Status: DNR Family Communication: Spoke with patient's daughter over telephone Disposition Plan: Physical therapy consultation pending, awaiting finalization of urine cultures   Antibiotics:  Vancomycin (Started 09/18/2013 discontinued on 09/20/2013)  Zosyn (Started 09/18/2013 to present)  HPI/Subjective: DEDRIA Gonzalez is a 80 y.o. female with a past medical history of dementia, currently nursing home resident who was transferred to the emergency room at [facility] on 09/18/2013 having a steep functional decline. She was found to be septic. Started on IV Vancomycin and Zosyn and admitted to the step-down unit.    Patient remains confused disoriented however arousable, following simple commands  Objective: Filed Vitals:   09/20/13 0459  BP: 152/68  Pulse: 87  Temp: 98.1 F (36.7 C)  Resp: 16    Intake/Output Summary (Last 24 hours) at 09/20/13 0840 Last data filed at 09/20/13 0500  Gross per 24 hour  Intake   1440 ml  Output   1950 ml  Net   -510 ml   Filed Weights   09/18/13 1225 09/19/13 0400  Weight: 90.1 kg (198 lb 10.2 oz) 93.2 kg (205 lb 7.5 oz)    Exam:   General:  Patient is sitting up feeding herself,  in no acute distress, awake and alert  Cardiovascular: Regular rate and rhythm normal S1S2  Respiratory: Normal inspiratory effort, lungs are clear to auscultion  Abdomen: Soft, nontender, nondistended  Musculoskeletal: No edema   Data Reviewed: Basic Metabolic Panel:  Recent Labs Lab 09/18/13 0804 09/19/13 0040 09/20/13 0404  NA 134* 138 135*  K 3.9 4.3 4.2  CL 96 104 101  CO2 24 23 24   GLUCOSE 157* 102* 132*  BUN 27* 22 17  CREATININE 1.08 1.14* 1.15*  CALCIUM 9.3 8.4 8.7   Liver Function Tests:  Recent Labs Lab 09/18/13 0804  AST 22  ALT 22  ALKPHOS 103  BILITOT <0.2*  PROT 6.9  ALBUMIN 3.0*   No results found for this basename: LIPASE, AMYLASE,  in the last 168 hours No results found for this basename: AMMONIA,  in the last 168 hours CBC:  Recent Labs Lab 09/18/13 0804 09/19/13 0040 09/20/13 0404  WBC 13.1* 8.0 6.4  NEUTROABS 11.1*  --   --   HGB 12.4 10.6* 10.2*  HCT 36.8 31.5* 31.1*  MCV 83.6 84.0 85.4  PLT 231 233 190   Cardiac Enzymes:  Recent Labs Lab 09/18/13 0804 09/18/13 1246 09/18/13 1852 09/19/13 0040  TROPONINI <0.30 <0.30 <0.30 <0.30   BNP (last 3 results) No results found for this basename: PROBNP,  in the last 8760 hours CBG:  Recent Labs Lab 09/19/13 0805 09/19/13 1248 09/19/13 1604 09/19/13 2122 09/20/13 0753  GLUCAP 117* 148* 83 223* 141*    Recent Results (from the past 240 hour(s))  URINE CULTURE     Status: None   Collection Time    09/18/13  8:29 AM      Result Value Ref Range Status   Specimen Description URINE, CATHETERIZED   Final   Special Requests NONE   Final   Culture  Setup Time     Final   Value: 09/18/2013 19:51     Performed at [facility] Count     Final   Value: >=100,000 COLONIES/ML     Performed at [facility]   Culture     Final   Value: ESCHERICHIA COLI     Performed at [facility]  Report Status PENDING   Incomplete  CULTURE, BLOOD (ROUTINE X  2)     Status: None   Collection Time    09/18/13  9:04 AM      Result Value Ref Range Status   Specimen Description BLOOD LEFT HAND   Final   Special Requests BOTTLES DRAWN AEROBIC AND ANAEROBIC 1CC   Final   Culture  Setup Time     Final   Value: 09/18/2013 18:06     Performed at [facility]   Culture     Final   Value:        BLOOD CULTURE RECEIVED NO GROWTH TO DATE CULTURE WILL BE HELD FOR 5 DAYS BEFORE ISSUING A FINAL NEGATIVE REPORT     Performed at [facility]   Report Status PENDING   Incomplete  CULTURE, BLOOD (ROUTINE X 2)     Status: None   Collection Time    09/18/13  9:34 AM      Result Value Ref Range Status   Specimen Description BLOOD RIGHT ARM   Final   Special Requests BOTTLES DRAWN AEROBIC AND ANAEROBIC 5CC   Final   Culture  Setup Time     Final   Value: 09/18/2013 18:07     Performed at [facility]   Culture     Final   Value:        BLOOD CULTURE RECEIVED NO GROWTH TO DATE CULTURE WILL BE HELD FOR 5 DAYS BEFORE ISSUING A FINAL NEGATIVE REPORT     Performed at [facility]   Report Status PENDING   Incomplete  URINE CULTURE     Status: None   Collection Time    09/18/13 12:14 PM      Result Value Ref Range Status   Specimen Description URINE, CATHETERIZED   Final   Special Requests NONE   Final   Culture  Setup Time     Final   Value: 09/18/2013 19:51     Performed at [facility] Count     Final   Value: 25,000 COLONIES/ML     Performed at [facility]   Culture     Final   Value: GRAM NEGATIVE RODS     Performed at [facility]   Report Status PENDING   Incomplete  MRSA PCR SCREENING     Status: None   Collection Time    09/18/13 12:17 PM      Result Value Ref Range Status   MRSA by PCR NEGATIVE  NEGATIVE Final   Comment:            The GeneXpert MRSA Assay (FDA     approved for NASAL specimens     only), is one component of a     comprehensive MRSA colonization      surveillance program. It is not     intended to diagnose MRSA     infection nor to guide or     monitor treatment for     MRSA infections.     Studies: No results found.  Scheduled Meds: . aspirin  81 mg Oral Daily  . donepezil  10 mg Oral QHS  . heparin  5,000 Units Subcutaneous 3 times per day  . insulin aspart  0-15 Units Subcutaneous TID WC  . insulin aspart  0-5 Units Subcutaneous QHS  . Memantine HCl ER  14 mg Oral Daily  . piperacillin-tazobactam (ZOSYN)  IV  3.375 g Intravenous 3 times per day  . sodium chloride  3 mL Intravenous Q12H   Continuous Infusions:    Principal Problem:   Sepsis Active Problems:   Urinary tract infection   Acute encephalopathy   FTT (failure to thrive) in adult   Dementia   DM (diabetes mellitus)   Syncope    Time spent: 35 min    Jeralyn BennettZAMORA, [place]on Lesesne  [facility] Pager [phone]. If 7PM-7AM, please contact night-coverage at [email or web address], password [email or web address] 09/20/2013, 8:40 AM  LOS: 2 days

## 2013-09-20 NOTE — Progress Notes (Signed)
Clinical Social Work Department BRIEF PSYCHOSOCIAL ASSESSMENT 09/20/2013  Patient:  Desiree Gonzalez,[name]     Account Number:  [id number]     Admit date:  09/18/2013  Clinical Social Worker:  Jacelyn GripBYRD,Santana Gosdin, LCSWA  Date/Time:  09/20/2013 11:00 AM  Referred by:  Physician  Date Referred:  09/20/2013 Referred for  ALF Placement   Other Referral:   Interview type:  Family Other interview type:    PSYCHOSOCIAL DATA Living Status:  FACILITY Admitted from facility:  [facility] Level of care:  Assisted Living Primary support name:  Elson ClanLetha Gonzalez/daughter/[phone] Primary support relationship to patient:  CHILD, ADULT Degree of support available:   adequate    CURRENT CONCERNS Current Concerns  Post-Acute Placement   Other Concerns:    SOCIAL WORK ASSESSMENT / PLAN CSW received [facility]    CSW reviewed chart and noted that pt oriented to person only. CSW contacted pt daughter via telephone. CSW introduced self and explained role. Pt daughter discussed that [facility]. Pt daughter shared [facility] pt was at another ALF for ten years. Pt daughter shared that at baseline pt needs limited assist and uses a walker for ambulation. Pt daughter shared that she is [facility] as pt is familiar with the facility. CSW discussed with pt daughter that PT planned to see pt today and [facility] will [facility] once PT evaluates to discuss with facility and determine if the facility feels they can continue to meet pt needs. Pt daughter expressed understanding.    [facility] and spoke with med tech, [place]. Facility stated that they would be happy to review information to determine if ALF can continue to meet pt needs.    CSW completed FL2  and sent pt clinicals to facility for review.    CSW [facility] regarding if facility feels they can continue to meet pt needs.    CSW to continue to follow to assist with pt disposition planning.   Assessment/plan status:  Psychosocial Support/Ongoing Assessment of Needs Other assessment/ plan:   discharge planning   Information/referral to community resources:   [facility]    PATIENT'S/FAMILY'S RESPONSE TO PLAN OF CARE: Per chart, pt oriented to person only. Pt has a history of Alzheimer's Dementia. Pt daughter supportive and very hopeful that pt will be able to [facility] given pt has been at facility for three years. Pt daughter appreciative of CSW assistance.   Loletta SpecterSuzanna Tatyanna Cronk, MSW, LCSW Clinical Social Work [phone]

## 2013-09-20 NOTE — Plan of Care (Signed)
Problem: Phase II Progression Outcomes Goal: Tolerating diet Outcome: Progressing Patient ate food after set up,needs encouragement. Tolerated her diet. No nausea nor vomiting.

## 2013-09-20 NOTE — Evaluation (Signed)
Physical Therapy Evaluation Patient Details Name: Desiree Gonzalez MRN: [id number] DOB: 1924/01/04 Today's Date: 09/20/2013   History of Present Illness  Desiree Gonzalez is a 80 y.o. female with a past medical history of dementia, currently nursing home resident who [facility]  After having a syncopal episode. Sepsis UTI  primary dx.  Clinical Impression  Pt pleasant, able to stand and take a few steps with RW. Prior functional level unknown. Pt may be near baseline. No information available. Pt will benefit from PT on acute to establish level of function in order to prevent loss of function.     Follow Up Recommendations SNF;Supervision/Assistance - 24 hour (may not require skilled PT at SNF as pt was a resident,  depends on prior level of function)    Equipment Recommendations  None recommended by PT    Recommendations for Other Services       Precautions / Restrictions Precautions Precautions: Fall Precaution Comments: incontinenece      Mobility  Bed Mobility Overal bed mobility: Needs Assistance Bed Mobility: Rolling;Supine to Sit;Sit to Supine Rolling: Min assist   Supine to sit: Max assist Sit to supine: Max assist   General bed mobility comments: tactile cues. pt does follow some commands  when she can hear it.  Transfers Overall transfer level: Needs assistance Equipment used: Rolling walker (2 wheeled) Transfers: Sit to/from Stand Sit to Stand: Mod assist;From elevated surface         General transfer comment: visual cues for activity instruction which pt abvle to follow after several repeats.  Ambulation/Gait Ambulation/Gait assistance: Mod assist Ambulation Distance (Feet): 3 Feet Assistive device: Rolling walker (2 wheeled)       General Gait Details: pt stepped sideways with RW with visaul cues.  Stairs            Wheelchair Mobility    Modified Rankin (Stroke Patients Only)        Balance Overall balance assessment: Needs assistance Sitting-balance support: No upper extremity supported;Feet supported Sitting balance-Leahy Scale: Fair                                       Pertinent Vitals/Pain No c/o    Home Living Family/patient expects to be discharged to:: Skilled nursing facility                      Prior Function           Comments: pt unable to provide     Hand Dominance        Extremity/Trunk Assessment   Upper Extremity Assessment: Generalized weakness           Lower Extremity Assessment: Generalized weakness         Communication   Communication: HOH (severe)  Cognition Arousal/Alertness: Awake/alert Behavior During Therapy: WFL for tasks assessed/performed Overall Cognitive Status: History of cognitive impairments - at baseline                      General Comments      Exercises        Assessment/Plan    PT Assessment Patient needs continued PT services  PT Diagnosis Generalized weakness   PT Problem List Decreased strength;Decreased activity tolerance;Decreased mobility;Decreased cognition;Decreased safety awareness  PT Treatment Interventions DME instruction;Functional mobility training;Therapeutic activities   PT  Goals (Current goals can be found in the Care Plan section) Acute Rehab PT Goals Patient Stated Goal: Pt did not state. Pt did stated "not Much"  when  asked if she walked. PT Goal Formulation: Patient unable to participate in goal setting Time For Goal Achievement: 10/04/13 Potential to Achieve Goals: Fair    Frequency Min 2X/week   Barriers to discharge        Co-evaluation               End of Session   Activity Tolerance: Patient tolerated treatment well Patient left: in bed;with call bell/phone within reach;with bed alarm set Nurse Communication: Mobility status         Time: 4098-1191 PT Time Calculation (min): 22 min   Charges:   PT  Evaluation $Initial PT Evaluation Tier I: 1 Procedure PT Treatments $Therapeutic Activity: 8-22 mins   PT G Codes:          Rada Hay 09/20/2013, 10:54 AM Blanchard Kelch PT [phone]

## 2013-09-21 ENCOUNTER — Encounter (HOSPITAL_COMMUNITY): Payer: Self-pay | Admitting: *Deleted

## 2013-09-21 LAB — BASIC METABOLIC PANEL
ANION GAP: 13 (ref 5–15)
BUN: 16 mg/dL (ref 6–23)
CO2: 24 mEq/L (ref 19–32)
CREATININE: 1.14 mg/dL — AB (ref 0.50–1.10)
Calcium: 9.3 mg/dL (ref 8.4–10.5)
Chloride: 99 mEq/L (ref 96–112)
GFR, EST AFRICAN AMERICAN: 48 mL/min — AB (ref >90)
GFR, EST NON AFRICAN AMERICAN: 42 mL/min — AB (ref >90)
Glucose, Bld: 123 mg/dL — ABNORMAL HIGH (ref 70–99)
POTASSIUM: 4.2 meq/L (ref 3.7–5.3)
Sodium: 136 mEq/L — ABNORMAL LOW (ref 137–147)

## 2013-09-21 LAB — URINE CULTURE
Colony Count: >=100000
Colony Count: 25000

## 2013-09-21 LAB — GLUCOSE, CAPILLARY
GLUCOSE-CAPILLARY: 140 mg/dL — AB (ref 70–99)
GLUCOSE-CAPILLARY: 121 mg/dL — AB (ref 70–99)
Glucose-Capillary: 174 mg/dL — ABNORMAL HIGH (ref 70–99)
Glucose-Capillary: 113 mg/dL — ABNORMAL HIGH (ref 70–99)

## 2013-09-21 LAB — CBC
HCT: 34.2 % — ABNORMAL LOW (ref 36.0–46.0)
HEMOGLOBIN: 11.3 g/dL — AB (ref 12.0–15.0)
MCH: 28.3 pg (ref 26.0–34.0)
MCHC: 33 g/dL (ref 30.0–36.0)
MCV: 85.5 fL (ref 78.0–100.0)
PLATELETS: 198 10*3/uL (ref 150–400)
RBC: 4 MIL/uL (ref 3.87–5.11)
RDW: 13.8 % (ref 11.5–15.5)
WBC: 6.3 10*3/uL (ref 4.0–10.5)

## 2013-09-21 NOTE — Progress Notes (Signed)
CSW continuing to follow for disposition planning.  [facility] and pt daughter wishes for pt to return upon discharge.  CSW received return phone call from pt ALF RN, Blake DivineShauna this morning. Per Blake DivineShauna, clinicals were reviewed and facility feels that they can continue to meet pt needs at ALF and pt can return when medically ready. ALF request that pt had Home Health PT/OT/SLP order upon discharge in order for ALF to initiate services at ALF. Per facility, [facility] at facility.  CSW contacted pt daughter via telephone and left message.  CSW to continue to follow and assist with pt discharge planning needs when pt medically ready for discharge.   Desiree SpecterSuzanna Gonzalez, MSW, LCSW Clinical Social Work [phone]

## 2013-09-21 NOTE — Progress Notes (Addendum)
Patient ID: Desiree Gonzalez, female   DOB: 1923/07/24, 80 y.o.   MRN: [id number]  [facility] PROGRESS NOTE  Desiree Gonzalez WUJ:[id number] DOB: 1923/07/24 DOA: 09/18/2013 PCP: No primary provider on file.  Brief narrative: 80 year old female with a past medical history of advanced dementia, presently a nursing home resident, who was admitted to the medicine service on 09/18/2013 presenting with mental status changes. On admission she was found to be septic as evidenced by hypothermia having a rectal temperature of 93.4, white count of 13,100, acute encephalopathy with a source of infection likely to be urinary tract infection. She was started on broad-spectrum empiric IV antibiotic therapy with IV Zosyn and vancomycin and admitted to the step down unit. She has a history of ESBL Escherichia coli based on a urine culture from 12/11/2012. Organism was susceptible to Zosyn per sensitivities report then.   Assessment/Plan:  Sepsis - evidenced by hypotension BP 83/44, hypothermia with temperature of 93.4, encephalopathy  - Source of infection UTI. Initial CXR did not show acute cardiopulmonary disease.  - She was started on emperic antibiotic therapy with Vancomycin and Zosyn.  - simplified regimen to Zosyn  UTI  - Patient with history of ESBL E. Coli from urine cultures drawn on 12/12/2102, susceptibilities testing then showed organism to be sensitive to Zosyn.  - Awaiting urine culture finalization  - Will continue IV Zosyn for now until cultures are resulted.  Acute Encephalopathy  - Likely secondary to sepsis  - Physical Therapy Consult appreciated  Diabetes mellitus.  - continue SSI for now  Hypertension.  - reasonable inpatient control  Acute on chronic renal failure stage II - Cr still elevated at 1.14 - repeat BMP in AM Severe protein calorie malnutrition - pt not eating much, < 50 % of the meals - advance diet as pt able to tolerate  Functional quadriplegia - PT pending    Code Status: DNR  Family Communication: No family at bedside  Disposition Plan: Physical therapy consultation pending, awaiting finalization of urine cultures   Antibiotics:   Vancomycin (Started 09/18/2013 discontinued on 09/20/2013)   Zosyn (Started 09/18/2013 to present) Consultants:  None  Procedures/Studies:  None   HPI/Subjective: No events overnight.   Objective: Filed Vitals:   09/20/13 1445 09/20/13 2143 09/21/13 0556 09/21/13 1400  BP: 146/70 152/57 157/76 150/59  Pulse: 63 65 61 64  Temp: 97 F (36.1 C) 97.8 F (36.6 C) 97.1 F (36.2 C) 97.4 F (36.3 C)  TempSrc: Oral Oral Oral Oral  Resp: 16 20 20 16   Height:      Weight:      SpO2: 97% 100% 97% 98%    Intake/Output Summary (Last 24 hours) at 09/21/13 1623 Last data filed at 09/21/13 1214  Gross per 24 hour  Intake    483 ml  Output      0 ml  Net    483 ml    Exam:   General:  Pt is somnolent and confused when awaken, follows some commands   Cardiovascular: Regular rate and rhythm  Respiratory: Clear to auscultation bilaterally, diminished breath sounds at bases   Abdomen: Soft, non tender, non distended, bowel sounds present, no guarding   Data Reviewed: Basic Metabolic Panel:  Recent Labs Lab 09/18/13 0804 09/19/13 0040 09/20/13 0404 09/21/13 0505  NA 134* 138 135* 136*  K 3.9 4.3 4.2 4.2  CL 96 104 101 99  CO2 24 23 24 24   GLUCOSE 157* 102* 132* 123*  BUN 27* 22  17 16  CREATININE 1.08 1.14* 1.15* 1.14*  CALCIUM 9.3 8.4 8.7 9.3   Liver Function Tests:  Recent Labs Lab 09/18/13 0804  AST 22  ALT 22  ALKPHOS 103  BILITOT <0.2*  PROT 6.9  ALBUMIN 3.0*   CBC:  Recent Labs Lab 09/18/13 0804 09/19/13 0040 09/20/13 0404 09/21/13 0505  WBC 13.1* 8.0 6.4 6.3  NEUTROABS 11.1*  --   --   --   HGB 12.4 10.6* 10.2* 11.3*  HCT 36.8 31.5* 31.1* 34.2*  MCV 83.6 84.0 85.4 85.5  PLT 231 233 190 198   Cardiac Enzymes:  Recent Labs Lab 09/18/13 0804 09/18/13 1246  09/18/13 1852 09/19/13 0040  TROPONINI <0.30 <0.30 <0.30 <0.30   CBG:  Recent Labs Lab 09/20/13 1313 09/20/13 1750 09/20/13 2137 09/21/13 0741 09/21/13 1143  GLUCAP 144* 113* 173* 140* 174*    Recent Results (from the past 240 hour(s))  URINE CULTURE     Status: None   Collection Time    09/18/13  8:29 AM      Result Value Ref Range Status   Specimen Description URINE, CATHETERIZED   Final   Special Requests NONE   Final   Culture  Setup Time     Final   Value: 09/18/2013 19:51     Performed at [facility] Count     Final   Value: >=100,000 COLONIES/ML     Performed at [facility]   Culture     Final   Value: ESCHERICHIA COLI     Performed at [facility]   Report Status 09/21/2013 FINAL   Final   Organism ID, Bacteria ESCHERICHIA COLI   Final  CULTURE, BLOOD (ROUTINE X 2)     Status: None   Collection Time    09/18/13  9:04 AM      Result Value Ref Range Status   Specimen Description BLOOD LEFT HAND   Final   Special Requests BOTTLES DRAWN AEROBIC AND ANAEROBIC 1CC   Final   Culture  Setup Time     Final   Value: 09/18/2013 18:06     Performed at [facility]   Culture     Final   Value:        BLOOD CULTURE RECEIVED NO GROWTH TO DATE CULTURE WILL BE HELD FOR 5 DAYS BEFORE ISSUING A FINAL NEGATIVE REPORT     Performed at [facility]   Report Status PENDING   Incomplete  CULTURE, BLOOD (ROUTINE X 2)     Status: None   Collection Time    09/18/13  9:34 AM      Result Value Ref Range Status   Specimen Description BLOOD RIGHT ARM   Final   Special Requests BOTTLES DRAWN AEROBIC AND ANAEROBIC 5CC   Final   Culture  Setup Time     Final   Value: 09/18/2013 18:07     Performed at [facility]   Culture     Final   Value:        BLOOD CULTURE RECEIVED NO GROWTH TO DATE CULTURE WILL BE HELD FOR 5 DAYS BEFORE ISSUING A FINAL NEGATIVE REPORT     Performed at [facility]   Report Status PENDING    Incomplete  URINE CULTURE     Status: None   Collection Time    09/18/13 12:14 PM      Result Value Ref Range Status   Specimen Description URINE,  CATHETERIZED   Final   Special Requests NONE   Final   Culture  Setup Time     Final   Value: 09/18/2013 19:51     Performed at [facility] Count     Final   Value: 25,000 COLONIES/ML     Performed at [facility]   Culture     Final   Value: ESCHERICHIA COLI     Performed at [facility]   Report Status 09/21/2013 FINAL   Final   Organism ID, Bacteria ESCHERICHIA COLI   Final  MRSA PCR SCREENING     Status: None   Collection Time    09/18/13 12:17 PM      Result Value Ref Range Status   MRSA by PCR NEGATIVE  NEGATIVE Final   Comment:            The GeneXpert MRSA Assay (FDA     approved for NASAL specimens     only), is one component of a     comprehensive MRSA colonization     surveillance program. It is not     intended to diagnose MRSA     infection nor to guide or     monitor treatment for     MRSA infections.  CULTURE, BLOOD (ROUTINE X 2)     Status: None   Collection Time    09/18/13  3:54 PM      Result Value Ref Range Status   Specimen Description BLOOD RIGHT HAND   Final   Special Requests BOTTLES DRAWN AEROBIC AND ANAEROBIC 10 CC   Final   Culture  Setup Time     Final   Value: 09/19/2013 08:44     Performed at [facility]   Culture     Final   Value:        BLOOD CULTURE RECEIVED NO GROWTH TO DATE CULTURE WILL BE HELD FOR 5 DAYS BEFORE ISSUING A FINAL NEGATIVE REPORT     Performed at [facility]   Report Status PENDING   Incomplete  CULTURE, BLOOD (ROUTINE X 2)     Status: None   Collection Time    09/18/13  4:13 PM      Result Value Ref Range Status   Specimen Description BLOOD LEFT HAND   Final   Special Requests BOTTLES DRAWN AEROBIC AND ANAEROBIC 10CC   Final   Culture  Setup Time     Final   Value: 09/19/2013 08:45     Performed at [facility]   Culture     Final   Value:        BLOOD CULTURE RECEIVED NO GROWTH TO DATE CULTURE WILL BE HELD FOR 5 DAYS BEFORE ISSUING A FINAL NEGATIVE REPORT     Performed at [facility]   Report Status PENDING   Incomplete     Scheduled Meds: . aspirin  81 mg Oral Daily  . donepezil  10 mg Oral QHS  . heparin  5,000 Units Subcutaneous 3 times per day  . insulin aspart  0-15 Units Subcutaneous TID WC  . insulin aspart  0-5 Units Subcutaneous QHS  . Memantine HCl ER  14 mg Oral Daily  . metoprolol tartrate  25 mg Oral BID  . piperacillin-tazobactam (ZOSYN)  IV  3.375 g Intravenous 3 times per day  . sodium chloride  3 mL Intravenous Q12H   Continuous Infusions:   Debbora Presto, MD  [facility] Pager [phone]  If 7PM-7AM, please contact night-coverage [email or web address] Password [facility] 09/21/2013, 4:23 PM   LOS: 3 days

## 2013-09-21 NOTE — Progress Notes (Signed)
ANTIBIOTIC CONSULT NOTE - Follow Up  Pharmacy Consult for Zosyn Indication: Ecoli UTI  No Known Allergies  Vital Signs: Temp: 97.1 F (36.2 C) (07/22 0556) Temp src: Oral (07/22 0556) BP: 157/76 mmHg (07/22 0556) Pulse Rate: 61 (07/22 0556) Intake/Output from previous day: 07/21 0701 - 07/22 0700 In: 600 [P.O.:600] Out: 200 [Urine:200] Intake/Output from this shift: Total I/O In: 120 [P.O.:120] Out: -   Labs:  Recent Labs  09/19/13 0040 09/20/13 0404 09/21/13 0505  WBC 8.0 6.4 6.3  HGB 10.6* 10.2* 11.3*  PLT 233 190 198  CREATININE 1.14* 1.15* 1.14*   Estimated Creatinine Clearance: 40 ml/min (by C-G formula based on Cr of 1.14). No results found for this basename: VANCOTROUGH, Leodis Binet, VANCORANDOM, GENTTROUGH, GENTPEAK, GENTRANDOM, TOBRATROUGH, TOBRAPEAK, TOBRARND, AMIKACINPEAK, AMIKACINTROU, AMIKACIN,  in the last 72 hours   Microbiology: Recent Results (from the past 720 hour(s))  URINE CULTURE     Status: None   Collection Time    09/18/13  8:29 AM      Result Value Ref Range Status   Specimen Description URINE, CATHETERIZED   Final   Special Requests NONE   Final   Culture  Setup Time     Final   Value: 09/18/2013 19:51     Performed at [facility] Count     Final   Value: >=100,000 COLONIES/ML     Performed at [facility]   Culture     Final   Value: ESCHERICHIA COLI     Performed at [facility]   Report Status PENDING   Incomplete  CULTURE, BLOOD (ROUTINE X 2)     Status: None   Collection Time    09/18/13  9:04 AM      Result Value Ref Range Status   Specimen Description BLOOD LEFT HAND   Final   Special Requests BOTTLES DRAWN AEROBIC AND ANAEROBIC 1CC   Final   Culture  Setup Time     Final   Value: 09/18/2013 18:06     Performed at [facility]   Culture     Final   Value:        BLOOD CULTURE RECEIVED NO GROWTH TO DATE CULTURE WILL BE HELD FOR 5 DAYS BEFORE ISSUING A FINAL NEGATIVE REPORT   Performed at [facility]   Report Status PENDING   Incomplete  CULTURE, BLOOD (ROUTINE X 2)     Status: None   Collection Time    09/18/13  9:34 AM      Result Value Ref Range Status   Specimen Description BLOOD RIGHT ARM   Final   Special Requests BOTTLES DRAWN AEROBIC AND ANAEROBIC 5CC   Final   Culture  Setup Time     Final   Value: 09/18/2013 18:07     Performed at [facility]   Culture     Final   Value:        BLOOD CULTURE RECEIVED NO GROWTH TO DATE CULTURE WILL BE HELD FOR 5 DAYS BEFORE ISSUING A FINAL NEGATIVE REPORT     Performed at [facility]   Report Status PENDING   Incomplete  URINE CULTURE     Status: None   Collection Time    09/18/13 12:14 PM      Result Value Ref Range Status   Specimen Description URINE, CATHETERIZED   Final   Special Requests NONE   Final   Culture  Setup Time  Final   Value: 09/18/2013 19:51     Performed at [facility] Count     Final   Value: 25,000 COLONIES/ML     Performed at [facility]   Culture     Final   Value: GRAM NEGATIVE RODS     Performed at [facility]   Report Status PENDING   Incomplete  MRSA PCR SCREENING     Status: None   Collection Time    09/18/13 12:17 PM      Result Value Ref Range Status   MRSA by PCR NEGATIVE  NEGATIVE Final   Comment:            The GeneXpert MRSA Assay (FDA     approved for NASAL specimens     only), is one component of a     comprehensive MRSA colonization     surveillance program. It is not     intended to diagnose MRSA     infection nor to guide or     monitor treatment for     MRSA infections.  CULTURE, BLOOD (ROUTINE X 2)     Status: None   Collection Time    09/18/13  3:54 PM      Result Value Ref Range Status   Specimen Description BLOOD RIGHT HAND   Final   Special Requests BOTTLES DRAWN AEROBIC AND ANAEROBIC 10 CC   Final   Culture  Setup Time     Final   Value: 09/19/2013 08:44     Performed at [facility]   Culture     Final   Value:        BLOOD CULTURE RECEIVED NO GROWTH TO DATE CULTURE WILL BE HELD FOR 5 DAYS BEFORE ISSUING A FINAL NEGATIVE REPORT     Performed at [facility]   Report Status PENDING   Incomplete  CULTURE, BLOOD (ROUTINE X 2)     Status: None   Collection Time    09/18/13  4:13 PM      Result Value Ref Range Status   Specimen Description BLOOD LEFT HAND   Final   Special Requests BOTTLES DRAWN AEROBIC AND ANAEROBIC 10CC   Final   Culture  Setup Time     Final   Value: 09/19/2013 08:45     Performed at [facility]   Culture     Final   Value:        BLOOD CULTURE RECEIVED NO GROWTH TO DATE CULTURE WILL BE HELD FOR 5 DAYS BEFORE ISSUING A FINAL NEGATIVE REPORT     Performed at [facility]   Report Status PENDING   Incomplete    Medical History: Past Medical History  Diagnosis Date  . CVA (cerebral infarction)   . Alzheimer's dementia   . GERD (gastroesophageal reflux disease)   . Hypertension   . Osteopenia   . Diabetes mellitus without complication     Medications:  Scheduled:  . aspirin  81 mg Oral Daily  . donepezil  10 mg Oral QHS  . heparin  5,000 Units Subcutaneous 3 times per day  . insulin aspart  0-15 Units Subcutaneous TID WC  . insulin aspart  0-5 Units Subcutaneous QHS  . Memantine HCl ER  14 mg Oral Daily  . metoprolol tartrate  25 mg Oral BID  . piperacillin-tazobactam (ZOSYN)  IV  3.375 g Intravenous 3 times per day  . sodium chloride  3  mL Intravenous Q12H   Assessment: 80 y.o. female with a past medical history of dementia, currently nursing home resident presents with syncopal episode.  Present on admission evidenced by hypothermia with rectal temperature of 93.4, white count of 13,100, acute encephalopathy, with the source of infection likely to be urinary tract infection. Pharmacy consulted to start broad spectrum emperic antibiotic therapy with IV Zosyn and Vacomycin on 7/19. Also note patient's hx of  ESBL Ecoli in urine  7/19 >> Vanc >> 7/21 7/19 >> Zosyn >>   Afebrile  WBC WNL, stable  SCr 1.14, stable. CrCl CG 40 and N 38  Urine Culture: Ecoli - awaiting sensitivities   Goal of Therapy:   zosyn per renal dosing guidelines  Eradication of infection  Plan:   Continue Zosyn 3.375mg  IV q8h  Follow renal function and cultures  Narrow antibiotic as Ecoli sensitivities become available   Hessie KnowsJustin M Cady Hafen, PharmD, BCPS Pager [phone] 09/21/2013 8:39 AM

## 2013-09-22 LAB — GLUCOSE, CAPILLARY
Glucose-Capillary: 143 mg/dL — ABNORMAL HIGH (ref 70–99)
Glucose-Capillary: 172 mg/dL — ABNORMAL HIGH (ref 70–99)

## 2013-09-22 LAB — BASIC METABOLIC PANEL
Anion gap: 13 (ref 5–15)
BUN: 20 mg/dL (ref 6–23)
CALCIUM: 9.1 mg/dL (ref 8.4–10.5)
CO2: 25 meq/L (ref 19–32)
CREATININE: 1.13 mg/dL — AB (ref 0.50–1.10)
Chloride: 98 mEq/L (ref 96–112)
GFR calc Af Amer: 49 mL/min — ABNORMAL LOW (ref >90)
GFR calc non Af Amer: 42 mL/min — ABNORMAL LOW (ref >90)
Glucose, Bld: 110 mg/dL — ABNORMAL HIGH (ref 70–99)
Potassium: 4.2 mEq/L (ref 3.7–5.3)
Sodium: 136 mEq/L — ABNORMAL LOW (ref 137–147)

## 2013-09-22 LAB — CBC
HCT: 34 % — ABNORMAL LOW (ref 36.0–46.0)
HEMOGLOBIN: 11.7 g/dL — AB (ref 12.0–15.0)
MCH: 28.9 pg (ref 26.0–34.0)
MCHC: 34.4 g/dL (ref 30.0–36.0)
MCV: 84 fL (ref 78.0–100.0)
Platelets: 209 10*3/uL (ref 150–400)
RBC: 4.05 MIL/uL (ref 3.87–5.11)
RDW: 13.7 % (ref 11.5–15.5)
WBC: 7.8 10*3/uL (ref 4.0–10.5)

## 2013-09-22 MED ORDER — OXYCODONE HCL 5 MG PO TABS: 5.0000 mg | ORAL_TABLET | ORAL | Status: DC | PRN

## 2013-09-22 MED ORDER — CIPROFLOXACIN HCL 500 MG PO TABS: 500.0000 mg | ORAL_TABLET | Freq: Two times a day (BID) | ORAL | Status: DC

## 2013-09-22 NOTE — Discharge Instructions (Signed)

## 2013-09-22 NOTE — Progress Notes (Addendum)
[facility].  CSW facilitated pt discharge needs including contacting facility, faxing pt discharge information to facility, discussing with facility RN, Clydie BraunShaunda who reviewed discharge information and confirmed that pt can return, providing RN phone number to call report, discussing with pt daughter, Elson ClanLetha via telephone, and arranging ambulance transport via [facility]. (Service Request ID#: [id number]).  Pt daughter appreciative of update and stated that she would be unable to visit pt today as she has a virus.   No further social work needs identified at this time.  CSW signing off.   Loletta SpecterSuzanna Mitchael Luckey, MSW, LCSW Clinical Social Work [phone]

## 2013-09-22 NOTE — Discharge Summary (Signed)
Physician Discharge Summary  Desiree Gonzalez ZOX:[id number] DOB: [birth year] DOA: 09/18/2013  PCP: No primary provider on file.  Admit date: 09/18/2013 Discharge date: 09/22/2013  Recommendations for Outpatient Follow-up:  1. Pt will need to follow up with PCP in 2-3 weeks post discharge 2. Please obtain BMP to evaluate electrolytes and kidney function 3. Please also check CBC to evaluate Hg and Hct levels 4. Please note that pt was discharged on cipro to complete 5 more days of therapy for UTI (urine culture sensitive to cipro)  Discharge Diagnoses: UTI Principal Problem:   Sepsis Active Problems:   Urinary tract infection   Dementia   DM (diabetes mellitus)   Syncope   Acute encephalopathy   FTT (failure to thrive) in adult  Discharge Condition: Stable  Diet recommendation: Heart healthy diet discussed in details   Brief narrative:  80 year old female with a past medical history of advanced dementia, presently a nursing home resident, who was admitted to the medicine service on 09/18/2013 presenting with mental status changes. On admission she was found to be septic as evidenced by hypothermia having a rectal temperature of 93.4, white count of 13,100, acute encephalopathy with a source of infection likely to be urinary tract infection. She was started on broad-spectrum empiric IV antibiotic therapy with IV Zosyn and vancomycin and admitted to the step down unit. She has a history of ESBL Escherichia coli based on a urine culture from 12/11/2012. Organism was susceptible to Zosyn per sensitivities report then.   Assessment/Plan:  Sepsis  - evidenced by hypotension BP 83/44, hypothermia with temperature of 93.4, encephalopathy  - Source of infection UTI. Initial CXR did not show acute cardiopulmonary disease.  - She was started on emperic antibiotic therapy with Vancomycin and Zosyn.  - simplified regimen to Zosyn but urine culture show E. Coli sensitive to cipro so will change her  ABX to Cipro for 5 more days UTI  - Patient with history of ESBL E. Coli from urine cultures drawn on 12/12/2102, susceptibilities testing then showed organism to be sensitive to Zosyn.  - Change ABX to Cipro as noted above  Acute Encephalopathy  - Likely secondary to sepsis  - Physical Therapy Consult appreciated  - pt at baseline mental status  Diabetes mellitus.  - continue home medical regimen upon discharge  Hypertension.  - reasonable inpatient control  Acute on chronic renal failure stage II  - Cr stable Severe protein calorie malnutrition  - pt tolerating current diet well Functional quadriplegia - ambulate as tolerated  Code Status: DNR  Family Communication: No family at bedside, daughter over the phone Disposition Plan: D/C ALF  Antibiotics:  Vancomycin (Started 09/18/2013 discontinued on 09/20/2013)  Zosyn (Started 09/18/2013 to present) --> 7/23 Cipro 7/23 --> 5 more days post discharge  Consultants:  None  Procedures/Studies:  None   Discharge Exam: Filed Vitals:   09/22/13 0545  BP: 147/77  Pulse: 70  Temp: 97.6 F (36.4 C)  Resp: 16   Filed Vitals:   09/21/13 0556 09/21/13 1400 09/21/13 2026 09/22/13 0545  BP: 157/76 150/59 150/62 147/77  Pulse: 61 64 65 70  Temp: 97.1 F (36.2 C) 97.4 F (36.3 C) 97.5 F (36.4 C) 97.6 F (36.4 C)  TempSrc: Oral Oral Oral Oral  Resp: 20 16 18 16   Height:      Weight:      SpO2: 97% 98% 97% 98%    General: Pt is alert, follows commands appropriately, not in acute distress, HOH Cardiovascular: Regular  rate and rhythm, S1/S2 +, no rubs, no gallops Respiratory: Clear to auscultation bilaterally, no wheezing, no crackles, no rhonchi Abdominal: Soft, non tender, non distended, bowel sounds +, no guarding  Discharge Instructions  Discharge Instructions   Diet - low sodium heart healthy    Complete by:  As directed      Increase activity slowly    Complete by:  As directed             Medication List          acetaminophen 500 MG tablet  Commonly known as:  TYLENOL  Take 1,000 mg by mouth every 6 (six) hours as needed. For pain     aspirin 81 MG chewable tablet  Chew 81 mg by mouth daily.     ciprofloxacin 500 MG tablet  Commonly known as:  CIPRO  Take 1 tablet (500 mg total) by mouth 2 (two) times daily.     donepezil 10 MG tablet  Commonly known as:  ARICEPT  Take 10 mg by mouth at bedtime.     EQL ATHLETES FOOT POWDER SPRAY 2 % Aerp  Generic drug:  Miconazole Nitrate  Apply 1 application topically 3 (three) times daily.     glipiZIDE 10 MG 24 hr tablet  Commonly known as:  GLUCOTROL XL  Take 10 mg by mouth daily with breakfast.     hydrochlorothiazide 12.5 MG capsule  Commonly known as:  MICROZIDE  Take 12.5 mg by mouth daily.     hydrocortisone 2.5 % cream  Apply 1 application topically 4 (four) times daily as needed (inflamed,itchy, painful hemorrhoids).     lisinopril 10 MG tablet  Commonly known as:  PRINIVIL,ZESTRIL  Take 10 mg by mouth daily.     metoprolol tartrate 25 MG tablet  Commonly known as:  LOPRESSOR  Take 1 tablet (25 mg total) by mouth 2 (two) times daily.     NAMENDA XR 14 MG Cp24  Generic drug:  Memantine HCl ER  Take 14 mg by mouth daily.     oxyCODONE 5 MG immediate release tablet  Commonly known as:  Oxy IR/ROXICODONE  Take 1 tablet (5 mg total) by mouth every 4 (four) hours as needed for moderate pain.     psyllium 0.52 G capsule  Commonly known as:  REGULOID  Take 0.52 g by mouth daily.     vitamin D (CHOLECALCIFEROL) 400 UNITS tablet  Take 400 Units by mouth daily.           Follow-up Information   Call Debbora PrestoMAGICK-Rory Montel, MD. (As needed, If symptoms worsen)    Specialty:  Internal Medicine   Contact information:   [address] [place] [address] [phone]        The results of significant diagnostics from this hospitalization (including imaging, microbiology, ancillary and laboratory) are listed below for  reference.     Microbiology: Recent Results (from the past 240 hour(s))  URINE CULTURE     Status: None   Collection Time    09/18/13  8:29 AM      Result Value Ref Range Status   Specimen Description URINE, CATHETERIZED   Final   Special Requests NONE   Final   Culture  Setup Time     Final   Value: 09/18/2013 19:51     Performed at [facility]   Colony Count     Final   Value: >=100,000 COLONIES/ML     Performed at [facility]  Culture     Final   Value: ESCHERICHIA COLI     Performed at [facility]   Report Status 09/21/2013 FINAL   Final   Organism ID, Bacteria ESCHERICHIA COLI   Final  CULTURE, BLOOD (ROUTINE X 2)     Status: None   Collection Time    09/18/13  9:04 AM      Result Value Ref Range Status   Specimen Description BLOOD LEFT HAND   Final   Special Requests BOTTLES DRAWN AEROBIC AND ANAEROBIC 1CC   Final   Culture  Setup Time     Final   Value: 09/18/2013 18:06     Performed at [facility]   Culture     Final   Value:        BLOOD CULTURE RECEIVED NO GROWTH TO DATE CULTURE WILL BE HELD FOR 5 DAYS BEFORE ISSUING A FINAL NEGATIVE REPORT     Performed at [facility]   Report Status PENDING   Incomplete  CULTURE, BLOOD (ROUTINE X 2)     Status: None   Collection Time    09/18/13  9:34 AM      Result Value Ref Range Status   Specimen Description BLOOD RIGHT ARM   Final   Special Requests BOTTLES DRAWN AEROBIC AND ANAEROBIC 5CC   Final   Culture  Setup Time     Final   Value: 09/18/2013 18:07     Performed at [facility]   Culture     Final   Value:        BLOOD CULTURE RECEIVED NO GROWTH TO DATE CULTURE WILL BE HELD FOR 5 DAYS BEFORE ISSUING A FINAL NEGATIVE REPORT     Performed at [facility]   Report Status PENDING   Incomplete  URINE CULTURE     Status: None   Collection Time    09/18/13 12:14 PM      Result Value Ref Range Status   Specimen Description URINE, CATHETERIZED   Final    Special Requests NONE   Final   Culture  Setup Time     Final   Value: 09/18/2013 19:51     Performed at [facility] Count     Final   Value: 25,000 COLONIES/ML     Performed at [facility]   Culture     Final   Value: ESCHERICHIA COLI     Performed at [facility]   Report Status 09/21/2013 FINAL   Final   Organism ID, Bacteria ESCHERICHIA COLI   Final  MRSA PCR SCREENING     Status: None   Collection Time    09/18/13 12:17 PM      Result Value Ref Range Status   MRSA by PCR NEGATIVE  NEGATIVE Final   Comment:            The GeneXpert MRSA Assay (FDA     approved for NASAL specimens     only), is one component of a     comprehensive MRSA colonization     surveillance program. It is not     intended to diagnose MRSA     infection nor to guide or     monitor treatment for     MRSA infections.  CULTURE, BLOOD (ROUTINE X 2)     Status: None   Collection Time    09/18/13  3:54 PM      Result Value Ref  Range Status   Specimen Description BLOOD RIGHT HAND   Final   Special Requests BOTTLES DRAWN AEROBIC AND ANAEROBIC 10 CC   Final   Culture  Setup Time     Final   Value: 09/19/2013 08:44     Performed at [facility]   Culture     Final   Value:        BLOOD CULTURE RECEIVED NO GROWTH TO DATE CULTURE WILL BE HELD FOR 5 DAYS BEFORE ISSUING A FINAL NEGATIVE REPORT     Performed at [facility]   Report Status PENDING   Incomplete  CULTURE, BLOOD (ROUTINE X 2)     Status: None   Collection Time    09/18/13  4:13 PM      Result Value Ref Range Status   Specimen Description BLOOD LEFT HAND   Final   Special Requests BOTTLES DRAWN AEROBIC AND ANAEROBIC 10CC   Final   Culture  Setup Time     Final   Value: 09/19/2013 08:45     Performed at [facility]   Culture     Final   Value:        BLOOD CULTURE RECEIVED NO GROWTH TO DATE CULTURE WILL BE HELD FOR 5 DAYS BEFORE ISSUING A FINAL NEGATIVE REPORT     Performed at  [facility]   Report Status PENDING   Incomplete     Labs: Basic Metabolic Panel:  Recent Labs Lab 09/18/13 0804 09/19/13 0040 09/20/13 0404 09/21/13 0505 09/22/13 0050  NA 134* 138 135* 136* 136*  K 3.9 4.3 4.2 4.2 4.2  CL 96 104 101 99 98  CO2 24 23 24 24 25   GLUCOSE 157* 102* 132* 123* 110*  BUN 27* 22 17 16 20   CREATININE 1.08 1.14* 1.15* 1.14* 1.13*  CALCIUM 9.3 8.4 8.7 9.3 9.1   Liver Function Tests:  Recent Labs Lab 09/18/13 0804  AST 22  ALT 22  ALKPHOS 103  BILITOT <0.2*  PROT 6.9  ALBUMIN 3.0*   CBC:  Recent Labs Lab 09/18/13 0804 09/19/13 0040 09/20/13 0404 09/21/13 0505 09/22/13 0050  WBC 13.1* 8.0 6.4 6.3 7.8  NEUTROABS 11.1*  --   --   --   --   HGB 12.4 10.6* 10.2* 11.3* 11.7*  HCT 36.8 31.5* 31.1* 34.2* 34.0*  MCV 83.6 84.0 85.4 85.5 84.0  PLT 231 233 190 198 209   Cardiac Enzymes:  Recent Labs Lab 09/18/13 0804 09/18/13 1246 09/18/13 1852 09/19/13 0040  TROPONINI <0.30 <0.30 <0.30 <0.30   CBG:  Recent Labs Lab 09/21/13 0741 09/21/13 1143 09/21/13 1744 09/21/13 2142 09/22/13 0744  GLUCAP 140* 174* 113* 121* 143*     SIGNED: Time coordinating discharge: Over 30 minutes  Debbora Presto, MD  [facility] 09/22/2013, 9:58 AM Pager [phone]  If 7PM-7AM, please contact night-coverage [email or web address] Password [email or web address]

## 2013-09-22 NOTE — Progress Notes (Signed)
Occupational Therapy Evaluation Patient Details Name: Desiree Gonzalez MRN: [id number] DOB: 1923/12/26 Today's Date: 09/22/2013    History of Present Illness Desiree Gonzalez is a 80 y.o. female with a past medical history of dementia, currently nursing home resident who was transferred to the emergency room at [facility]  After having a syncopal episode. Sepsis UTI  primary dx.   Clinical Impression   Patient with unknown PLOF, but resides at SNF where the staff can meet her needs. Recommend OT follow-up at SNF.    Follow Up Recommendations  SNF;Supervision/Assistance - 24 hour    Equipment Recommendations  Other (comment) (tbd by snf)    Recommendations for Other Services       Precautions / Restrictions Precautions Precautions: Fall Precaution Comments: incontinenece      Mobility Bed Mobility Overal bed mobility: Needs Assistance Bed Mobility: Rolling;Supine to Sit;Sit to Supine Rolling: Mod assist   Supine to sit: Max assist Sit to supine: Max assist   General bed mobility comments: tactile cues. pt does follow some commands  when she can hear it.  Transfers                      Balance                                            ADL Overall ADL's : Needs assistance/impaired Eating/Feeding: Set up;Bed level   Grooming: Wash/dry hands;Wash/dry face;Set up;Cueing for sequencing;Sitting   Upper Body Bathing: Moderate assistance   Lower Body Bathing: Total assistance   Upper Body Dressing : Moderate assistance   Lower Body Dressing: Total assistance                 General ADL Comments: Patient agreeable to OT. Sat EOB x 8 minutes with S-mod A. Pt kept trying to lie back down. Once supine, pt needed total A to scoot up in bed. Rolled L and R for pad adjustment with mod A.     Vision                     Perception     Praxis      Pertinent Vitals/Pain No c/o pain     Hand Dominance Right    Extremity/Trunk Assessment Upper Extremity Assessment Upper Extremity Assessment: Generalized weakness   Lower Extremity Assessment Lower Extremity Assessment: Generalized weakness       Communication Communication Communication: HOH   Cognition Arousal/Alertness: Awake/alert Behavior During Therapy: WFL for tasks assessed/performed Overall Cognitive Status: History of cognitive impairments - at baseline       Memory: Decreased short-term memory             General Comments       Exercises       Shoulder Instructions      Home Living Family/patient expects to be discharged to:: Skilled nursing facility                                        Prior Functioning/Environment          Comments: pt unable to provide and no family present    OT Diagnosis: Generalized weakness   OT Problem List: Decreased strength;Decreased activity tolerance;Decreased range of motion;Impaired balance (sitting  and/or standing);Decreased cognition   OT Treatment/Interventions:      OT Goals(Current goals can be found in the care plan section) Acute Rehab OT Goals Patient Stated Goal: none stated  OT Frequency:     Barriers to D/C:            Co-evaluation              End of Session    Activity Tolerance: Patient tolerated treatment well Patient left: in bed;with call bell/phone within reach;with bed alarm set   Time: [id number] OT Time Calculation (min): 17 min Charges:  OT General Charges $OT Visit: 1 Procedure OT Evaluation $Initial OT Evaluation Tier I: 1 Procedure OT Treatments $Self Care/Home Management : 8-22 mins G-Codes:    Governor Matos A 09/22/2013, 10:36 AM

## 2013-09-22 NOTE — Progress Notes (Signed)
[facility] Nature conservation [facility]ana [facility] [place].

## 2013-09-24 LAB — CULTURE, BLOOD (ROUTINE X 2)
Culture: NO GROWTH
Culture: NO GROWTH

## 2013-09-25 LAB — CULTURE, BLOOD (ROUTINE X 2)
CULTURE: NO GROWTH
Culture: NO GROWTH

## 2013-09-29 ENCOUNTER — Inpatient Hospital Stay (HOSPITAL_COMMUNITY)
Admission: EM | Admit: 2013-09-29 | Discharge: 2013-10-10 | DRG: 871 | Disposition: A | Discharge status: Skilled Nursing Facility | Payer: Medicare Other | Attending: Internal Medicine | Admitting: Internal Medicine

## 2013-09-29 ENCOUNTER — Emergency Department (HOSPITAL_COMMUNITY): Payer: Medicare Other

## 2013-09-29 ENCOUNTER — Encounter (HOSPITAL_COMMUNITY): Payer: Self-pay | Admitting: Emergency Medicine

## 2013-09-29 DIAGNOSIS — Z66 Do not resuscitate: Secondary | ICD-10-CM | POA: Diagnosis present

## 2013-09-29 DIAGNOSIS — I6350 Cerebral infarction due to unspecified occlusion or stenosis of unspecified cerebral artery: Secondary | ICD-10-CM | POA: Diagnosis present

## 2013-09-29 DIAGNOSIS — N390 Urinary tract infection, site not specified: Secondary | ICD-10-CM | POA: Diagnosis present

## 2013-09-29 DIAGNOSIS — M949 Disorder of cartilage, unspecified: Secondary | ICD-10-CM

## 2013-09-29 DIAGNOSIS — Z79899 Other long term (current) drug therapy: Secondary | ICD-10-CM | POA: Diagnosis not present

## 2013-09-29 DIAGNOSIS — R6510 Systemic inflammatory response syndrome (SIRS) of non-infectious origin without acute organ dysfunction: Secondary | ICD-10-CM

## 2013-09-29 DIAGNOSIS — G309 Alzheimer's disease, unspecified: Secondary | ICD-10-CM | POA: Diagnosis present

## 2013-09-29 DIAGNOSIS — K219 Gastro-esophageal reflux disease without esophagitis: Secondary | ICD-10-CM | POA: Diagnosis present

## 2013-09-29 DIAGNOSIS — R4182 Altered mental status, unspecified: Secondary | ICD-10-CM

## 2013-09-29 DIAGNOSIS — M899 Disorder of bone, unspecified: Secondary | ICD-10-CM | POA: Diagnosis present

## 2013-09-29 DIAGNOSIS — I447 Left bundle-branch block, unspecified: Secondary | ICD-10-CM | POA: Diagnosis present

## 2013-09-29 DIAGNOSIS — G9341 Metabolic encephalopathy: Secondary | ICD-10-CM | POA: Diagnosis present

## 2013-09-29 DIAGNOSIS — B372 Candidiasis of skin and nail: Secondary | ICD-10-CM | POA: Diagnosis present

## 2013-09-29 DIAGNOSIS — G9340 Encephalopathy, unspecified: Secondary | ICD-10-CM | POA: Diagnosis present

## 2013-09-29 DIAGNOSIS — Z8673 Personal history of transient ischemic attack (TIA), and cerebral infarction without residual deficits: Secondary | ICD-10-CM | POA: Diagnosis not present

## 2013-09-29 DIAGNOSIS — Z7982 Long term (current) use of aspirin: Secondary | ICD-10-CM | POA: Diagnosis not present

## 2013-09-29 DIAGNOSIS — R404 Transient alteration of awareness: Secondary | ICD-10-CM | POA: Diagnosis present

## 2013-09-29 DIAGNOSIS — A419 Sepsis, unspecified organism: Secondary | ICD-10-CM | POA: Diagnosis present

## 2013-09-29 DIAGNOSIS — E119 Type 2 diabetes mellitus without complications: Secondary | ICD-10-CM | POA: Diagnosis present

## 2013-09-29 DIAGNOSIS — E876 Hypokalemia: Secondary | ICD-10-CM

## 2013-09-29 DIAGNOSIS — K5732 Diverticulitis of large intestine without perforation or abscess without bleeding: Secondary | ICD-10-CM

## 2013-09-29 DIAGNOSIS — M25519 Pain in unspecified shoulder: Secondary | ICD-10-CM

## 2013-09-29 DIAGNOSIS — E139 Other specified diabetes mellitus without complications: Secondary | ICD-10-CM

## 2013-09-29 DIAGNOSIS — R627 Adult failure to thrive: Secondary | ICD-10-CM | POA: Diagnosis present

## 2013-09-29 DIAGNOSIS — T68XXXD Hypothermia, subsequent encounter: Secondary | ICD-10-CM

## 2013-09-29 DIAGNOSIS — R1310 Dysphagia, unspecified: Secondary | ICD-10-CM | POA: Diagnosis present

## 2013-09-29 DIAGNOSIS — N3000 Acute cystitis without hematuria: Secondary | ICD-10-CM

## 2013-09-29 DIAGNOSIS — K5792 Diverticulitis of intestine, part unspecified, without perforation or abscess without bleeding: Secondary | ICD-10-CM | POA: Diagnosis present

## 2013-09-29 DIAGNOSIS — T68XXXS Hypothermia, sequela: Secondary | ICD-10-CM

## 2013-09-29 DIAGNOSIS — N183 Chronic kidney disease, stage 3 unspecified: Secondary | ICD-10-CM | POA: Diagnosis present

## 2013-09-29 DIAGNOSIS — R0902 Hypoxemia: Secondary | ICD-10-CM

## 2013-09-29 DIAGNOSIS — I10 Essential (primary) hypertension: Secondary | ICD-10-CM | POA: Diagnosis present

## 2013-09-29 DIAGNOSIS — R680 Hypothermia, not associated with low environmental temperature: Secondary | ICD-10-CM | POA: Diagnosis present

## 2013-09-29 DIAGNOSIS — I129 Hypertensive chronic kidney disease with stage 1 through stage 4 chronic kidney disease, or unspecified chronic kidney disease: Secondary | ICD-10-CM | POA: Diagnosis present

## 2013-09-29 DIAGNOSIS — E089 Diabetes mellitus due to underlying condition without complications: Secondary | ICD-10-CM

## 2013-09-29 DIAGNOSIS — I2699 Other pulmonary embolism without acute cor pulmonale: Secondary | ICD-10-CM

## 2013-09-29 DIAGNOSIS — F0280 Dementia in other diseases classified elsewhere without behavioral disturbance: Secondary | ICD-10-CM | POA: Diagnosis present

## 2013-09-29 DIAGNOSIS — T68XXXA Hypothermia, initial encounter: Secondary | ICD-10-CM

## 2013-09-29 DIAGNOSIS — F0390 Unspecified dementia without behavioral disturbance: Secondary | ICD-10-CM | POA: Diagnosis present

## 2013-09-29 DIAGNOSIS — A4151 Sepsis due to Escherichia coli [E. coli]: Principal | ICD-10-CM | POA: Diagnosis present

## 2013-09-29 HISTORY — DX: Chronic kidney disease, stage 3 (moderate): N18.3

## 2013-09-29 LAB — COMPREHENSIVE METABOLIC PANEL
ALBUMIN: 3 g/dL — AB (ref 3.5–5.2)
ALT: 22 U/L (ref 0–35)
ANION GAP: 14 (ref 5–15)
AST: 17 U/L (ref 0–37)
Alkaline Phosphatase: 106 U/L (ref 39–117)
BUN: 31 mg/dL — AB (ref 6–23)
CALCIUM: 9.6 mg/dL (ref 8.4–10.5)
CO2: 23 mEq/L (ref 19–32)
CREATININE: 1.28 mg/dL — AB (ref 0.50–1.10)
Chloride: 98 mEq/L (ref 96–112)
GFR calc Af Amer: 42 mL/min — ABNORMAL LOW (ref >90)
GFR calc non Af Amer: 36 mL/min — ABNORMAL LOW (ref >90)
Glucose, Bld: 118 mg/dL — ABNORMAL HIGH (ref 70–99)
POTASSIUM: 4.4 meq/L (ref 3.7–5.3)
Sodium: 135 mEq/L — ABNORMAL LOW (ref 137–147)
Total Bilirubin: <0.2 mg/dL — ABNORMAL LOW (ref 0.3–1.2)
Total Protein: 7 g/dL (ref 6.0–8.3)

## 2013-09-29 LAB — CBC WITH DIFFERENTIAL/PLATELET
Basophils Absolute: 0 10*3/uL (ref 0.0–0.1)
Basophils Relative: 0 % (ref 0–1)
EOS ABS: 0.1 10*3/uL (ref 0.0–0.7)
Eosinophils Relative: 1 % (ref 0–5)
HCT: 36.5 % (ref 36.0–46.0)
HEMOGLOBIN: 12.2 g/dL (ref 12.0–15.0)
Lymphocytes Relative: 17 % (ref 12–46)
Lymphs Abs: 1.5 10*3/uL (ref 0.7–4.0)
MCH: 28.6 pg (ref 26.0–34.0)
MCHC: 33.4 g/dL (ref 30.0–36.0)
MCV: 85.5 fL (ref 78.0–100.0)
MONO ABS: 0.7 10*3/uL (ref 0.1–1.0)
Monocytes Relative: 8 % (ref 3–12)
NEUTROS PCT: 74 % (ref 43–77)
Neutro Abs: 6.3 10*3/uL (ref 1.7–7.7)
Platelets: 232 10*3/uL (ref 150–400)
RBC: 4.27 MIL/uL (ref 3.87–5.11)
RDW: 14.4 % (ref 11.5–15.5)
WBC: 8.5 10*3/uL (ref 4.0–10.5)

## 2013-09-29 LAB — URINALYSIS, ROUTINE W REFLEX MICROSCOPIC
Bilirubin Urine: NEGATIVE
Glucose, UA: NEGATIVE mg/dL
Hgb urine dipstick: NEGATIVE
Ketones, ur: NEGATIVE mg/dL
Leukocytes, UA: NEGATIVE
NITRITE: NEGATIVE
Protein, ur: NEGATIVE mg/dL
Specific Gravity, Urine: 1.012 (ref 1.005–1.030)
UROBILINOGEN UA: 0.2 mg/dL (ref 0.0–1.0)
pH: 5.5 (ref 5.0–8.0)

## 2013-09-29 LAB — GLUCOSE, CAPILLARY
GLUCOSE-CAPILLARY: 122 mg/dL — AB (ref 70–99)
GLUCOSE-CAPILLARY: 89 mg/dL (ref 70–99)
Glucose-Capillary: 99 mg/dL (ref 70–99)

## 2013-09-29 LAB — I-STAT CG4 LACTIC ACID, ED: LACTIC ACID, VENOUS: 1.51 mmol/L (ref 0.5–2.2)

## 2013-09-29 MED ORDER — ONDANSETRON HCL 4 MG PO TABS: 4.0000 mg | ORAL_TABLET | Freq: Four times a day (QID) | ORAL | Status: DC | PRN

## 2013-09-29 MED ORDER — CIPROFLOXACIN IN D5W 400 MG/200ML IV SOLN: 400.0000 mg | Freq: Two times a day (BID) | INTRAVENOUS | Status: DC

## 2013-09-29 MED ORDER — CETYLPYRIDINIUM CHLORIDE 0.05 % MT LIQD
7.0000 mL | Freq: Two times a day (BID) | OROMUCOSAL | Status: DC
  Administered 2013-09-29 – 2013-10-10 (×19): 7 mL via OROMUCOSAL

## 2013-09-29 MED ORDER — IOHEXOL 300 MG/ML  SOLN
100.0000 mL | Freq: Once | INTRAMUSCULAR | Status: AC | PRN
  Administered 2013-09-29: 100 mL via INTRAVENOUS

## 2013-09-29 MED ORDER — SODIUM CHLORIDE 0.9 % IV BOLUS (SEPSIS)
500.0000 mL | Freq: Once | INTRAVENOUS | Status: AC
  Administered 2013-09-29: 500 mL via INTRAVENOUS

## 2013-09-29 MED ORDER — METRONIDAZOLE IN NACL 5-0.79 MG/ML-% IV SOLN: 500.0000 mg | Freq: Three times a day (TID) | INTRAVENOUS | Status: DC

## 2013-09-29 MED ORDER — ENOXAPARIN SODIUM 40 MG/0.4ML ~~LOC~~ SOLN
40.0000 mg | SUBCUTANEOUS | Status: DC
  Administered 2013-09-29 – 2013-10-09 (×11): 40 mg via SUBCUTANEOUS
  Filled 2013-09-29 (×13): qty 0.4

## 2013-09-29 MED ORDER — SODIUM CHLORIDE 0.9 % IV SOLN
1000.0000 mL | Freq: Once | INTRAVENOUS | Status: AC
  Administered 2013-09-29: 1000 mL via INTRAVENOUS

## 2013-09-29 MED ORDER — ONDANSETRON HCL 4 MG/2ML IJ SOLN: 4.0000 mg | Freq: Four times a day (QID) | INTRAMUSCULAR | Status: DC | PRN

## 2013-09-29 MED ORDER — SODIUM CHLORIDE 0.9 % IV SOLN
INTRAVENOUS | Status: DC
  Administered 2013-09-29 – 2013-10-01 (×5): via INTRAVENOUS
  Administered 2013-10-02: 10 mL via INTRAVENOUS
  Administered 2013-10-10: via INTRAVENOUS

## 2013-09-29 MED ORDER — PIPERACILLIN-TAZOBACTAM 3.375 G IVPB 30 MIN
3.3750 g | Freq: Once | INTRAVENOUS | Status: AC
  Administered 2013-09-29: 3.375 g via INTRAVENOUS
  Filled 2013-09-29: qty 50

## 2013-09-29 MED ORDER — SODIUM CHLORIDE 0.9 % IV BOLUS (SEPSIS)
500.0000 mL | Freq: Once | INTRAVENOUS | Status: AC
  Administered 2013-09-29: 14:00:00 via INTRAVENOUS

## 2013-09-29 MED ORDER — SODIUM CHLORIDE 0.9 % IV SOLN
INTRAVENOUS | Status: AC
  Administered 2013-09-29: 14:00:00 via INTRAVENOUS

## 2013-09-29 MED ORDER — VANCOMYCIN HCL IN DEXTROSE 1-5 GM/200ML-% IV SOLN
1000.0000 mg | Freq: Once | INTRAVENOUS | Status: AC
  Administered 2013-09-29: 1000 mg via INTRAVENOUS
  Filled 2013-09-29: qty 200

## 2013-09-29 MED ORDER — MICONAZOLE NITRATE 2 % EX CREA
1.0000 "application " | TOPICAL_CREAM | Freq: Two times a day (BID) | CUTANEOUS | Status: DC
  Administered 2013-09-29 – 2013-10-10 (×22): 1 via TOPICAL
  Filled 2013-09-29 (×2): qty 14
  Filled 2013-09-29: qty 28

## 2013-09-29 MED ORDER — INSULIN ASPART 100 UNIT/ML ~~LOC~~ SOLN
0.0000 [IU] | SUBCUTANEOUS | Status: DC
  Administered 2013-09-29 – 2013-10-01 (×5): 1 [IU] via SUBCUTANEOUS

## 2013-09-29 MED ORDER — PIPERACILLIN-TAZOBACTAM 3.375 G IVPB
3.3750 g | Freq: Three times a day (TID) | INTRAVENOUS | Status: DC
  Administered 2013-09-29 – 2013-10-08 (×26): 3.375 g via INTRAVENOUS
  Filled 2013-09-29 (×26): qty 50

## 2013-09-29 NOTE — ED Notes (Addendum)
Upon assessment pt arousal and states name but falls asleep easily. Attempt to perform neurological assessment unsuccessful due to pt unable to follow commands. Pt does not grip with command but able to pull toward self. Pt pupils equal and reactive. Pt squints upon assessment. Unable to obtain oral or axillary tempeture due to not registering.   With changing pt to gown, pt has red swollen folds to groin.

## 2013-09-29 NOTE — ED Notes (Signed)
Pt states "just laying here." Pt denies pain at present time. Pt falls asleep after conversation but pt awakes with verbal and responds to questions.

## 2013-09-29 NOTE — Progress Notes (Signed)
ANTIBIOTIC CONSULT NOTE - INITIAL  Pharmacy Consult for Zosyn Indication: recurrent UTI/E coli  No Known Allergies  Patient Measurements: Height: 5\' 7"  (170.2 cm) Weight: 201 lb 8 oz (91.4 kg) IBW/kg (Calculated) : 61.6  Vital Signs: Temp: 93.7 F (34.3 C) (07/30 1535) Temp src: Core (Comment) (07/30 1535) BP: 113/51 mmHg (07/30 1535) Pulse Rate: 67 (07/30 1535) Intake/Output from previous day:   Intake/Output from this shift: Total I/O In: 167.5 [I.V.:167.5] Out: 790 [Urine:790]  Labs:  Recent Labs  09/29/13 1229  WBC 8.5  HGB 12.2  PLT 232  CREATININE 1.28*   Estimated Creatinine Clearance: 35.3 ml/min (by C-G formula based on Cr of 1.28). No results found for this basename: VANCOTROUGH, Leodis Binet, VANCORANDOM, GENTTROUGH, GENTPEAK, GENTRANDOM, TOBRATROUGH, TOBRAPEAK, TOBRARND, AMIKACINPEAK, AMIKACINTROU, AMIKACIN,  in the last 72 hours   Microbiology: Recent Results (from the past 720 hour(s))  URINE CULTURE     Status: None   Collection Time    09/18/13  8:29 AM      Result Value Ref Range Status   Specimen Description URINE, CATHETERIZED   Final   Special Requests NONE   Final   Culture  Setup Time     Final   Value: 09/18/2013 19:51     Performed at [facility] Count     Final   Value: >=100,000 COLONIES/ML     Performed at [facility]   Culture     Final   Value: ESCHERICHIA COLI     Performed at [facility]   Report Status 09/21/2013 FINAL   Final   Organism ID, Bacteria ESCHERICHIA COLI   Final  CULTURE, BLOOD (ROUTINE X 2)     Status: None   Collection Time    09/18/13  9:04 AM      Result Value Ref Range Status   Specimen Description BLOOD LEFT HAND   Final   Special Requests BOTTLES DRAWN AEROBIC AND ANAEROBIC 1CC   Final   Culture  Setup Time     Final   Value: 09/18/2013 18:06     Performed at [facility]   Culture     Final   Value: NO GROWTH 5 DAYS     Performed at [facility]   Report Status 09/24/2013 FINAL   Final  CULTURE, BLOOD (ROUTINE X 2)     Status: None   Collection Time    09/18/13  9:34 AM      Result Value Ref Range Status   Specimen Description BLOOD RIGHT ARM   Final   Special Requests BOTTLES DRAWN AEROBIC AND ANAEROBIC 5CC   Final   Culture  Setup Time     Final   Value: 09/18/2013 18:07     Performed at [facility]   Culture     Final   Value: NO GROWTH 5 DAYS     Performed at [facility]   Report Status 09/24/2013 FINAL   Final  URINE CULTURE     Status: None   Collection Time    09/18/13 12:14 PM      Result Value Ref Range Status   Specimen Description URINE, CATHETERIZED   Final   Special Requests NONE   Final   Culture  Setup Time     Final   Value: 09/18/2013 19:51     Performed at [facility] Count     Final   Value: 25,000 COLONIES/ML  Performed at [facility]     Final   Value: ESCHERICHIA COLI     Performed at [facility]   Report Status 09/21/2013 FINAL   Final   Organism ID, Bacteria ESCHERICHIA COLI   Final  MRSA PCR SCREENING     Status: None   Collection Time    09/18/13 12:17 PM      Result Value Ref Range Status   MRSA by PCR NEGATIVE  NEGATIVE Final   Comment:            The GeneXpert MRSA Assay (FDA     approved for NASAL specimens     only), is one component of a     comprehensive MRSA colonization     surveillance program. It is not     intended to diagnose MRSA     infection nor to guide or     monitor treatment for     MRSA infections.  CULTURE, BLOOD (ROUTINE X 2)     Status: None   Collection Time    09/18/13  3:54 PM      Result Value Ref Range Status   Specimen Description BLOOD RIGHT HAND   Final   Special Requests BOTTLES DRAWN AEROBIC AND ANAEROBIC 10 CC   Final   Culture  Setup Time     Final   Value: 09/19/2013 08:44     Performed at [facility]   Culture     Final   Value: NO GROWTH 5 DAYS     Performed at  [facility]   Report Status 09/25/2013 FINAL   Final  CULTURE, BLOOD (ROUTINE X 2)     Status: None   Collection Time    09/18/13  4:13 PM      Result Value Ref Range Status   Specimen Description BLOOD LEFT HAND   Final   Special Requests BOTTLES DRAWN AEROBIC AND ANAEROBIC 10CC   Final   Culture  Setup Time     Final   Value: 09/19/2013 08:45     Performed at [facility]   Culture     Final   Value: NO GROWTH 5 DAYS     Performed at [facility]   Report Status 09/25/2013 FINAL   Final   Medical History: Past Medical History  Diagnosis Date  . CVA (cerebral infarction)   . Alzheimer's dementia   . GERD (gastroesophageal reflux disease)   . Hypertension   . Osteopenia   . Diabetes mellitus without complication    Medications:  Scheduled:  . sodium chloride   Intravenous STAT  . antiseptic oral rinse  7 mL Mouth Rinse BID  . enoxaparin (LOVENOX) injection  40 mg Subcutaneous Q24H  . insulin aspart  0-9 Units Subcutaneous 6 times per day  . miconazole  1 application Topical BID   Anti-infectives   Start     Dose/Rate Route Frequency Ordered Stop   09/29/13 2000  ciprofloxacin (CIPRO) IVPB 400 mg  Status:  Discontinued     400 mg 200 mL/hr over 60 Minutes Intravenous Every 12 hours 09/29/13 1540 09/29/13 1550   09/29/13 2000  metroNIDAZOLE (FLAGYL) IVPB 500 mg  Status:  Discontinued     500 mg 100 mL/hr over 60 Minutes Intravenous Every 8 hours 09/29/13 1540 09/29/13 1550   09/29/13 1245  vancomycin (VANCOCIN) IVPB 1000 mg/200 mL premix     1,000 mg 200 mL/hr over 60 Minutes Intravenous  Once 09/29/13 1234 09/29/13 1420   09/29/13 1245  piperacillin-tazobactam (ZOSYN) IVPB 3.375 g     3.375 g 100 mL/hr over 30 Minutes Intravenous  Once 09/29/13 1234 09/29/13 1316     Assessment: 89 yoF unresponsive at SNF, to ED 7/30, admit for IV abx-likely urosepsis. Last admit 7/19-7/23 for urosepsis, E coli in urine treated with Zosyn, continue Cipro at  SNF.   Urine cx 7/19: E coli - not an ESBL producer  Urine cx 12/11/12: E coli + for ESBL  Goal of Therapy:  Antibiotic/dose/schedule appropriate for renal function, pathogen, disease state  Plan:   Begin Zosyn 3.375gm q8hr-4 hr infusion  Follow urine cx closely-if ESBL producer-switch abx to Carbapenem (Primaxin)  Otho BellowsGreen, Kewan Mcnease L PharmD Pager [phone] 09/29/2013, 4:13 PM

## 2013-09-29 NOTE — ED Notes (Addendum)
[facility] Staff reports she was sitting in a chair and was "unresponsive" so they placed her on the floor. Responds to pain upon EMS arrival. Opened eyes when she stood up. Ambulatory at baseline. Hx Alzheimer's. Is usually verbal but is not at this time and this is not baseline for her. VS: BP 130/70 HR 69 Regular RR 18 reg/unlab SpO2 98% on RA. CBG 112. GCS 12 for EMS. Stroke screen neg. "Grip strengths equal." DNR in place-paperwork at bedside.

## 2013-09-29 NOTE — Progress Notes (Signed)
CARE MANAGEMENT NOTE 09/29/2013  Patient:  Alveria ApleySMITH,Lianah M   Account Number:  [id number]  Date Initiated:  09/29/2013  Documentation initiated by:  DAVIS,RHONDA  Subjective/Objective Assessment:   pt admitted unresponsvie from local snf, was found down this am, dnr status, poss sepsis, hypothermic and hypotensive-iv ns bolus and iv abx started     Action/Plan:   return to snf when able   Anticipated DC Date:  10/02/2013   Anticipated DC Plan:  SKILLED NURSING FACILITY  In-house referral  Clinical Social Worker      DC Planning Services  NA      [facility]  NA   Choice offered to / List presented to:  NA   DME arranged  NA      DME agency  NA     HH arranged  NA      HH agency  NA   Status of service:  In process, will continue to follow Medicare Important Message given?  NA - LOS <3 / Initial given by admissions (If response is "NO", the following Medicare IM given date fields will be blank) Date Medicare IM given:   Medicare IM given by:   Date Additional Medicare IM given:   Additional Medicare IM given by:    Discharge Disposition:    Per UR Regulation:  Reviewed for med. necessity/level of care/duration of stay  If discussed at Long Length of Stay Meetings, dates discussed:    Comments:  Bjorn LoserRhonda davis,Rn,BSN,CCM: next review due on [id number].

## 2013-09-29 NOTE — ED Notes (Signed)
MD aware of trending vital signs.

## 2013-09-29 NOTE — ED Provider Notes (Signed)
CSN: [id number]     Arrival date & time 09/29/13  1153 History   First MD Initiated Contact with Patient 09/29/13 1159     Chief Complaint  Patient presents with  . Fatigue     (Consider location/radiation/quality/duration/timing/severity/associated sxs/prior Treatment) HPI Comments: Level V caveat - altered mental status  Patient is a 80 y.o. female presenting with altered mental status. The history is provided by the patient.  Altered Mental Status Presenting symptoms: unresponsiveness   Severity:  Severe Most recent episode:  Today Episode history:  Single Duration: unknown. Timing:  Constant Progression:  Unchanged Chronicity:  New Context: dementia and nursing home resident     Past Medical History  Diagnosis Date  . CVA (cerebral infarction)   . Alzheimer's dementia   . GERD (gastroesophageal reflux disease)   . Hypertension   . Osteopenia   . Diabetes mellitus without complication    History reviewed. No pertinent past surgical history. History reviewed. No pertinent family history. History  Substance Use Topics  . Smoking status: Never Smoker   . Smokeless tobacco: Not on file  . Alcohol Use: No   OB History   Grav Para Term Preterm Abortions TAB SAB Ect Mult Living                 Review of Systems  Unable to perform ROS: Mental status change      Allergies  Review of patient's allergies indicates no known allergies.  Home Medications   Prior to Admission medications   Medication Sig Start Date End Date Taking? Authorizing Provider  acetaminophen (TYLENOL) 500 MG tablet Take 1,000 mg by mouth every 6 (six) hours as needed. For pain    Historical Provider, MD  aspirin 81 MG chewable tablet Chew 81 mg by mouth daily.    Historical Provider, MD  donepezil (ARICEPT) 10 MG tablet Take 10 mg by mouth at bedtime.      Historical Provider, MD  lisinopril (PRINIVIL,ZESTRIL) 10 MG tablet Take 10 mg by mouth daily.      Historical Provider, MD  Memantine  HCl ER (NAMENDA XR) 14 MG CP24 Take 14 mg by mouth daily.    Historical Provider, MD  Miconazole Nitrate (EQL ATHLETES FOOT POWDER SPRAY) 2 % AERP Apply 1 application topically 3 (three) times daily.    Historical Provider, MD  oxyCODONE (OXY IR/ROXICODONE) 5 MG immediate release tablet Take 1 tablet (5 mg total) by mouth every 4 (four) hours as needed for moderate pain. 09/22/13   Dorothea OgleIskra M Myers, MD  psyllium (REGULOID) 0.52 G capsule Take 0.52 g by mouth daily.    Historical Provider, MD   BP 111/58  Pulse 60  Temp(Src) 94.5 F (34.7 C) (Rectal)  Resp 16  SpO2 95% Physical Exam  Nursing note and vitals reviewed. Constitutional: She appears well-developed and well-nourished. No distress.  HENT:  Head: Normocephalic and atraumatic.  Eyes: EOM are normal. Pupils are equal, round, and reactive to light.  Neck: Normal range of motion. Neck supple.  Cardiovascular: Normal rate and regular rhythm.  Exam reveals no friction rub.   No murmur heard. Pulmonary/Chest: Effort normal and breath sounds normal. No respiratory distress. She has no wheezes. She has no rales.  Abdominal: Soft. She exhibits distension (mild). There is tenderness. There is guarding (diffuse, involuntary). There is no rebound.  Musculoskeletal: Normal range of motion. She exhibits no edema.  Neurological: GCS eye subscore is 1. GCS verbal subscore is 1. GCS motor subscore is 5.  Skin: She is not diaphoretic.    ED Course  Procedures (including critical care time) Labs Review Labs Reviewed  CULTURE, BLOOD (ROUTINE X 2)  CULTURE, BLOOD (ROUTINE X 2)  URINE CULTURE  CBC WITH DIFFERENTIAL  COMPREHENSIVE METABOLIC PANEL  URINALYSIS, ROUTINE W REFLEX MICROSCOPIC  I-STAT CG4 LACTIC ACID, ED    Imaging Review Dg Chest 1 View  09/29/2013   CLINICAL DATA:  Unresponsive, fatigue.  EXAM: CHEST - 1 VIEW  COMPARISON:  Chest radiograph September 18, 2013  FINDINGS: Cardiac silhouette appears upper limits of normal, even with  consideration to this low inspiratory portable examination with crowded vascular markings. Calcified aortic knob. Mild chronic interstitial changes, left lung base strandy densities. No pleural effusions. Biapical pleural thickening. No pneumothorax.  Subacute to chronic left humerus surgical neck fracture. Patient is osteopenic. Multiple EKG lines overlie the patient and may obscure subtle underlying pathology.  IMPRESSION: Borderline cardiomegaly, left lung base atelectasis and this low inspiratory portable examination.   Electronically Signed   By: Awilda Metro   On: 09/29/2013 13:21   Ct Head Wo Contrast  09/29/2013   CLINICAL DATA:  Episode of unresponsiveness.  EXAM: CT HEAD WITHOUT CONTRAST  TECHNIQUE: Contiguous axial images were obtained from the base of the skull through the vertex without intravenous contrast.  COMPARISON:  09/18/2013.  FINDINGS: No skull fracture or intracranial hemorrhage.  Remote infarct right basal ganglia/ corona radiata with subsequent dilation right lateral ventricle.  Prominent small vessel disease type changes.  Remote small infarct left basal ganglia.  No CT evidence of large acute infarct.  Atrophy without hydrocephalus.  No intracranial mass lesion noted on this unenhanced exam.  Polypoid opacification left frontal sinus.  IMPRESSION: Remote infarcts and small vessel disease type changes without CT evidence of large acute infarct.  No intracranial hemorrhage.  Atrophy without hydrocephalus.  Polypoid opacification left frontal sinus.   Electronically Signed   By: Bridgett Larsson M.D.   On: 09/29/2013 13:23   Ct Abdomen Pelvis W Contrast  09/29/2013   CLINICAL DATA:  Patient unresponsive.  EXAM: CT ABDOMEN AND PELVIS WITH CONTRAST  TECHNIQUE: Multidetector CT imaging of the abdomen and pelvis was performed using the standard protocol following bolus administration of intravenous contrast.  CONTRAST:  OMNIPAQUE IOHEXOL 300 MG/ML  SOLN  COMPARISON:  CT chest  02/11/2012.  FINDINGS: Liver normal. Spleen normal. Pancreas is unremarkable. No biliary distention. Gallbladder nondistended. Gallstones cannot be excluded. No pericholecystic fluid collection.  Adrenals normal. Punctate lucencies in both kidneys most consistent with simple cysts. No obstructing ureteral stone. Foley catheter in bladder. Air noted in the bladder, most likely from instrumentation. Urinary tract infection cannot be excluded. Uterus unremarkable. Tiny right ovarian simple cyst.  No significant adenopathy. Abdominal aorta normal in caliber. Aortoiliac atherosclerotic vascular disease. Visceral vessels are patent. No significant adenopathy.  Appendix normal. Extensive diverticulosis. Fat plane stranding noted adjacent to the left colon, images number 42 through 50/series 2. Findings consistent with diverticulitis. No abscess. No bowel obstruction. No free air. Stomach is nondistended. No mesenteric mass.  Heart size normal. Mild basilar atelectasis. Degenerative changes lumbar spine. Multiple lumbar compression fractures are present. These are mild. No prominent retropulsed fragments.  IMPRESSION: Mild changes of left colonic diverticulitis. No evidence of abscess.   Electronically Signed   By: Maisie Fus  Register   On: 09/29/2013 14:05     EKG Interpretation   Date/Time:  Thursday September 29 2013 12:22:29 EDT Ventricular Rate:  63 PR Interval:  200  QRS Duration: 111 QT Interval:  470 QTC Calculation: 481 R Axis:   22 Text Interpretation:  Sinus rhythm Incomplete left bundle branch block  Anterior Q waves, possibly due to ILBBB Similar to prior Confirmed by  Gwendolyn Grant  MD, Liboria Putnam ([id number]) on 09/29/2013 1:09:48 PM      CRITICAL CARE Performed by: Dagmar Hait   Total critical care time: 30 minutes  Critical care time was exclusive of separately billable procedures and treating other patients.  Critical care was necessary to treat or prevent imminent or life-threatening  deterioration.  Critical care was time spent personally by me on the following activities: development of treatment plan with patient and/or surrogate as well as nursing, discussions with consultants, evaluation of patient's response to treatment, examination of patient, obtaining history from patient or surrogate, ordering and performing treatments and interventions, ordering and review of laboratory studies, ordering and review of radiographic studies, pulse oximetry and re-evaluation of patient's condition.  MDM   Final diagnoses:  Altered mental status, unspecified altered mental status type  Diverticulitis of large intestine without perforation or abscess without bleeding  Hypothermia, initial encounter    80 year old female comes in from the nursing home for being unresponsive. He was found down this morning. Was in bed. Open eyes occasionally to voice. She is a DO NOT RESUSCITATE. She does have a history of Alzheimer's. Her baseline is walking, talking, pleasantly demented. Here her vitals show hypothermia at 94.5. Her vitals are stable. She is not in any respiratory distress. Her GCS is 7. She does have diffuse abdominal pain with involuntary guarding. With hypothermia, CODE Sepsis initiated. We'll not intubate as she is a DO NOT RESUSCITATE. Will obtain labs, urine, CT of her abdomen. Broad spectrum antibiotics initiated. Persistent hypothermia. Labs and workup show diverticulitis. Admitted to [facility] unit.   Dagmar Hait, MD 09/29/13 [phone]

## 2013-09-29 NOTE — H&P (Addendum)
History and Physical:    Desiree Gonzalez EAV:[id number] DOB: 04/18/1923 DOA: 09/29/2013  Referring physician: Dr. Gwendolyn GrantWalden PCP: Elie ConferWESTERMANN,CAROLA JO, MD   Chief Complaint: Unresponsiveness  History of Present Illness:   Desiree Gonzalez is an 80 y.o. female nursing home resident with a PMH of dementia who, at baseline is alert and conversant but confused, who was found down in her bed this morning by nursing home staff. She was unresponsive to voice but would open eyes. She was brought to the hospital via EMS, however I am unable to obtain any history from the patient as she is not verbally responsive although she does follow some simple commands. Upon initial presentation to the ER, she was noted to be hypothermic with a temperature of 94.5.  The patient's daughter tells me she was in good condition when she was discharged from the hospital (treated for ESBL E. Coli UTI 09/18/13-09/22/13), and was feeding herself on the day she was discharged. Patient's daughter says that her change in condition was sudden, and at the nursing home staff called her and told her that she was "unresponsive ".  ROS:   Unable to obtain, patient nonverbal.   Past Medical History:   Past Medical History  Diagnosis Date  . CVA (cerebral infarction)   . Alzheimer's dementia   . GERD (gastroesophageal reflux disease)   . Hypertension   . Osteopenia   . Diabetes mellitus without complication     Past Surgical History:   History reviewed. No pertinent past surgical history.  Social History:   History   Social History  . Marital Status: Divorced    Spouse Name: N/A    Number of Children: N/A  . Years of Education: N/A   Occupational History  . Not on file.   Social History Main Topics  . Smoking status: Never Smoker   . Smokeless tobacco: Never Used  . Alcohol Use: No  . Drug Use: No  . Sexual Activity: No   Other Topics Concern  . Not on file   Social History Narrative   [facility]. Divorced. Demented.    Family history:   History reviewed. No pertinent family history. Patient unable to provide any details.  Allergies   Review of patient's allergies indicates no known allergies.  Current Medications:   Prior to Admission medications   Medication Sig Start Date End Date Taking? Authorizing Provider  acetaminophen (TYLENOL) 500 MG tablet Take 1,000 mg by mouth every 6 (six) hours as needed for moderate pain.    Yes Historical Provider, MD  aspirin 81 MG chewable tablet Chew 81 mg by mouth every morning.    Yes Historical Provider, MD  cholecalciferol (VITAMIN D) 400 UNITS TABS tablet Take 400 Units by mouth every morning.   Yes Historical Provider, MD  donepezil (ARICEPT) 10 MG tablet Take 10 mg by mouth at bedtime.    Yes Historical Provider, MD  glipiZIDE (GLUCOTROL XL) 10 MG 24 hr tablet Take 10 mg by mouth daily with breakfast.   Yes Historical Provider, MD  hydrochlorothiazide (MICROZIDE) 12.5 MG capsule Take 12.5 mg by mouth every morning.   Yes Historical Provider, MD  lisinopril (PRINIVIL,ZESTRIL) 10 MG tablet Take 10 mg by mouth every morning.    Yes Historical Provider, MD  Memantine HCl ER (NAMENDA XR) 14 MG CP24 Take 14 mg by mouth every morning.    Yes Historical Provider, MD  metoprolol tartrate (LOPRESSOR) 25 MG tablet Take 25 mg by  mouth 2 (two) times daily.   Yes Historical Provider, MD  miconazole (BAZA ANTIFUNGAL) 2 % cream Apply 1 application topically as needed (incontinent episodes).   Yes Historical Provider, MD  oxyCODONE (OXY IR/ROXICODONE) 5 MG immediate release tablet Take 5 mg by mouth every 4 (four) hours as needed for moderate pain or severe pain.   Yes Historical Provider, MD  psyllium (REGULOID) 0.52 G capsule Take 0.52 g by mouth every morning.    Yes Historical Provider, MD    Physical Exam:   Filed Vitals:   09/29/13 1500 09/29/13 1510 09/29/13 1511 09/29/13 1535  BP: 97/52  110/57 113/51  Pulse: 67 62 71 67    Temp: 93.5 F (34.2 C) 93.5 F (34.2 C) 93.5 F (34.2 C) 93.7 F (34.3 C)  TempSrc:   Other (Comment) Core (Comment)  Resp: 23 15 18 13   Height:    5\' 7"  (1.702 m)  Weight:    91.4 kg (201 lb 8 oz)  SpO2: 96% 98% 98% 97%     Physical Exam: Blood pressure 113/51, pulse 67, temperature 93.7 F (34.3 C), temperature source Core (Comment), resp. rate 13, height 5\' 7"  (1.702 m), weight 91.4 kg (201 lb 8 oz), SpO2 97.00%. Gen: No acute distress. Nonverbal eyes open. Head: Normocephalic, atraumatic. Eyes: PERRL, EOMI, sclerae nonicteric. Mouth: Oropharynx appears clear but cannot examine posterior pharynx. Moist mucous membranes. Fair dentition. Neck: Supple, no thyromegaly, no lymphadenopathy, no jugular venous distention. Chest: Lungs are clear to auscultation bilaterally with good air movement. CV: Heart sounds are regular. No murmurs, rubs, or gallops. Abdomen: Soft, nontender, nondistended with normal active bowel sounds. Extremities: Extremities are warm and dry, 1+ edema. Skin: Warm and dry. Intertriginous yeast noted in perianal area/groin folds. Neuro: Alert  But not verbally responsive; cranial nerves II through XII grossly intact. Follows some limited commands. Psych: Mood and affect flat.   Data Review:    Labs: Basic Metabolic Panel:  Recent Labs Lab 09/29/13 1229  NA 135*  K 4.4  CL 98  CO2 23  GLUCOSE 118*  BUN 31*  CREATININE 1.28*  CALCIUM 9.6   Liver Function Tests:  Recent Labs Lab 09/29/13 1229  AST 17  ALT 22  ALKPHOS 106  BILITOT <0.2*  PROT 7.0  ALBUMIN 3.0*   CBC:  Recent Labs Lab 09/29/13 1229  WBC 8.5  NEUTROABS 6.3  HGB 12.2  HCT 36.5  MCV 85.5  PLT 232   Radiographic Studies: Dg Chest 1 View  09/29/2013   CLINICAL DATA:  Unresponsive, fatigue.  EXAM: CHEST - 1 VIEW  COMPARISON:  Chest radiograph September 18, 2013  FINDINGS: Cardiac silhouette appears upper limits of normal, even with consideration to this low inspiratory  portable examination with crowded vascular markings. Calcified aortic knob. Mild chronic interstitial changes, left lung base strandy densities. No pleural effusions. Biapical pleural thickening. No pneumothorax.  Subacute to chronic left humerus surgical neck fracture. Patient is osteopenic. Multiple EKG lines overlie the patient and may obscure subtle underlying pathology.  IMPRESSION: Borderline cardiomegaly, left lung base atelectasis and this low inspiratory portable examination.   Electronically Signed   By: Awilda Metro   On: 09/29/2013 13:21   Ct Head Wo Contrast  09/29/2013   CLINICAL DATA:  Episode of unresponsiveness.  EXAM: CT HEAD WITHOUT CONTRAST  TECHNIQUE: Contiguous axial images were obtained from the base of the skull through the vertex without intravenous contrast.  COMPARISON:  09/18/2013.  FINDINGS: No skull fracture or intracranial hemorrhage.  Remote infarct right basal ganglia/ corona radiata with subsequent dilation right lateral ventricle.  Prominent small vessel disease type changes.  Remote small infarct left basal ganglia.  No CT evidence of large acute infarct.  Atrophy without hydrocephalus.  No intracranial mass lesion noted on this unenhanced exam.  Polypoid opacification left frontal sinus.  IMPRESSION: Remote infarcts and small vessel disease type changes without CT evidence of large acute infarct.  No intracranial hemorrhage.  Atrophy without hydrocephalus.  Polypoid opacification left frontal sinus.   Electronically Signed   By: Bridgett Larsson M.D.   On: 09/29/2013 13:23   Ct Abdomen Pelvis W Contrast  09/29/2013   CLINICAL DATA:  Patient unresponsive.  EXAM: CT ABDOMEN AND PELVIS WITH CONTRAST  TECHNIQUE: Multidetector CT imaging of the abdomen and pelvis was performed using the standard protocol following bolus administration of intravenous contrast.  CONTRAST:  OMNIPAQUE IOHEXOL 300 MG/ML  SOLN  COMPARISON:  CT chest 02/11/2012.  FINDINGS: Liver normal. Spleen  normal. Pancreas is unremarkable. No biliary distention. Gallbladder nondistended. Gallstones cannot be excluded. No pericholecystic fluid collection.  Adrenals normal. Punctate lucencies in both kidneys most consistent with simple cysts. No obstructing ureteral stone. Foley catheter in bladder. Air noted in the bladder, most likely from instrumentation. Urinary tract infection cannot be excluded. Uterus unremarkable. Tiny right ovarian simple cyst.  No significant adenopathy. Abdominal aorta normal in caliber. Aortoiliac atherosclerotic vascular disease. Visceral vessels are patent. No significant adenopathy.  Appendix normal. Extensive diverticulosis. Fat plane stranding noted adjacent to the left colon, images number 42 through 50/series 2. Findings consistent with diverticulitis. No abscess. No bowel obstruction. No free air. Stomach is nondistended. No mesenteric mass.  Heart size normal. Mild basilar atelectasis. Degenerative changes lumbar spine. Multiple lumbar compression fractures are present. These are mild. No prominent retropulsed fragments.  IMPRESSION: Mild changes of left colonic diverticulitis. No evidence of abscess.   Electronically Signed   By: Maisie Fus  Register   On: 09/29/2013 14:05    EKG: Independently reviewed. Sinus rhythm at 63 beats per minute.  Incomplete left bundle branch block.  Anterior Q waves, possibly due to ILBB.  Similar to prior.   Assessment/Plan:   Principal Problem:   Sepsis secondary to Diverticulitis versus recurrent ESBL UTI  Patient recently hospitalized with ESBL Escherichia coli UTI/sepsis. Urinalysis clear, but cultures re-sent.  Mild diverticulitis noted on CT scan of the abdomen and pelvis.  Empirically placed on Zosyn which will cover intra-abdominal infections from diverticulitis as well as ESBL Escherichia coli based on prior sensitivities.  Monitor in the SDU.  Active Problems:   Dementia  Hold Aricept and Namenda for now secondary to  concerns regarding airway protection.    DM (diabetes mellitus)  Hold Glucotrol. Will cover with SSI, insulin sensitive scale every 4 hours while n.p.o.    HTN (hypertension)  Blood pressure soft. Hold hydrochlorothiazide, lisinopril, and metoprolol for now.    Acute encephalopathy  Likely reflective of acute sepsis.    FTT (failure to thrive) in adult  Supportive care. The patient is a DO NOT RESUSCITATE.     Hypothermia   Secondary to sepsis. Bear hugger to increase body temperature.    DVT prophylaxis  Lovenox ordered.  Code Status: DNR Family Communication: Daughter called and updated by telephone. Disposition Plan: SNF when stable.  Time spent: 70 minutes.   Naba Sneed [facility] Pager [phone] Cell: [phone]   If 7PM-7AM, please contact night-coverage [email or web address] Password [email or web address] 09/29/2013, 3:52 PM    **Disclaimer: This  note was dictated with voice recognition software. Similar sounding words can inadvertently be transcribed and this note may contain transcription errors which may not have been corrected upon publication of note.**

## 2013-09-29 NOTE — ED Notes (Signed)
Pt transported to CT ?

## 2013-09-29 NOTE — ED Notes (Signed)
Rectal temperature obtained and reported to MD.

## 2013-09-29 NOTE — ED Notes (Signed)
Bear hugger applied 

## 2013-09-29 NOTE — ED Notes (Signed)
Pt awakes and states to staff not in pain.

## 2013-09-29 NOTE — ED Notes (Signed)
Bed: WA24 Expected date:  Expected time:  Means of arrival:  Comments: ems 

## 2013-09-30 DIAGNOSIS — B372 Candidiasis of skin and nail: Secondary | ICD-10-CM

## 2013-09-30 DIAGNOSIS — Z5189 Encounter for other specified aftercare: Secondary | ICD-10-CM

## 2013-09-30 LAB — GLUCOSE, CAPILLARY
GLUCOSE-CAPILLARY: 137 mg/dL — AB (ref 70–99)
GLUCOSE-CAPILLARY: 113 mg/dL — AB (ref 70–99)
Glucose-Capillary: 121 mg/dL — ABNORMAL HIGH (ref 70–99)
Glucose-Capillary: 95 mg/dL (ref 70–99)

## 2013-09-30 LAB — URINE CULTURE
Colony Count: NO GROWTH
Culture: NO GROWTH

## 2013-09-30 LAB — BASIC METABOLIC PANEL
ANION GAP: 13 (ref 5–15)
BUN: 26 mg/dL — ABNORMAL HIGH (ref 6–23)
CO2: 21 mEq/L (ref 19–32)
Calcium: 8.6 mg/dL (ref 8.4–10.5)
Chloride: 103 mEq/L (ref 96–112)
Creatinine, Ser: 1.39 mg/dL — ABNORMAL HIGH (ref 0.50–1.10)
GFR calc Af Amer: 38 mL/min — ABNORMAL LOW (ref >90)
GFR, EST NON AFRICAN AMERICAN: 33 mL/min — AB (ref >90)
Glucose, Bld: 126 mg/dL — ABNORMAL HIGH (ref 70–99)
POTASSIUM: 4.6 meq/L (ref 3.7–5.3)
SODIUM: 137 meq/L (ref 137–147)

## 2013-09-30 LAB — CBC
HCT: 33.4 % — ABNORMAL LOW (ref 36.0–46.0)
HEMOGLOBIN: 11 g/dL — AB (ref 12.0–15.0)
MCH: 28.4 pg (ref 26.0–34.0)
MCHC: 32.9 g/dL (ref 30.0–36.0)
MCV: 86.3 fL (ref 78.0–100.0)
Platelets: 210 10*3/uL (ref 150–400)
RBC: 3.87 MIL/uL (ref 3.87–5.11)
RDW: 14.7 % (ref 11.5–15.5)
WBC: 10.9 10*3/uL — ABNORMAL HIGH (ref 4.0–10.5)

## 2013-09-30 MED ORDER — FLUCONAZOLE 100MG IVPB
100.0000 mg | INTRAVENOUS | Status: DC
  Administered 2013-09-30 – 2013-10-06 (×7): 100 mg via INTRAVENOUS
  Filled 2013-09-30 (×10): qty 50

## 2013-09-30 NOTE — Progress Notes (Signed)
SLP Cancellation Note  Patient Details Name: Desiree Gonzalez MRN: [id number] DOB: 1923/10/21   Cancelled treatment:        Orders received for BSE. Pt currently insufficiently arousable for evaluation. Will continue efforts.  Celia B. PabellonesBueche, [facility] HospitalMSP, CCC-SLP [phone]  Leigh AuroraBueche, Celia Brown 09/30/2013, 2:21 PM

## 2013-09-30 NOTE — Progress Notes (Signed)
Clinical Social Work Department BRIEF PSYCHOSOCIAL ASSESSMENT 09/30/2013  Patient:  Desiree Gonzalez,Desiree Gonzalez     Account Number:  [id number]     Admit date:  09/29/2013  Clinical Social Worker:  Candie ChromanHAIDINGER,Nekeya Briski, LCSW  Date/Time:  09/30/2013 10:34 AM  Referred by:  Physician  Date Referred:  09/30/2013 Referred for  SNF Placement   Other Referral:   Interview type:  Family Other interview type:    PSYCHOSOCIAL DATA Living Status:  FACILITY Admitted from facility:  [facility] [place] Level of care:   Primary support name:  Desiree Gonzalez Primary support relationship to patient:  CHILD, ADULT Degree of support available:   supportive    CURRENT CONCERNS Current Concerns  Post-Acute Placement   Other Concerns:    SOCIAL WORK ASSESSMENT / PLAN Pt is an 80 yr [facility]. Pt is unable to participate in d/c planning. CSW has spoken to pt's daughter to offer assistance with d/c planning Desiree Gonzalez( Desiree Gonzalez  [phone] ).  Daughter hopes pt will return to baseline and return to ALF. MD notes message left for daughter . CSW encouraged daughter to contact MD for medical update. CSW did speak to MD this am and was told it's too soon to determine d/c needs. This info was shared with daughter.    CSW will continue to follow to assist with d/c planning needs.   Assessment/plan status:  Psychosocial Support/Ongoing Assessment of Needs Other assessment/ plan:   Information/referral to community resources:   None needed at this time.    PATIENT'S/FAMILY'S RESPONSE TO PLAN OF CARE: D/C planning is ongoing. Pt had been hospitalized earlier this month and returned to ALF. It's unclear, at this point, if SNF may be needed. CSW will continue to offer support, reassurance and assistance with d/c planning.   Cori RazorJamie Byrdie Miyazaki LCSW [phone]

## 2013-09-30 NOTE — Progress Notes (Signed)
Progress Note   Desiree Gonzalez ZOX:[id number] DOB: 01-30-1924 DOA: 09/29/2013 PCP: Elie ConferWESTERMANN,CAROLA JO, MD   Brief Narrative:   Desiree Gonzalez is an 80 y.o. female nursing home resident with a PMH of recent hospitalization 09/18/13-09/22/13 for treatment of ESBL Escherichia coli, and dementia who, at baseline is alert and conversant but confused, who was found unresponsive by staff members on 09/29/13. Upon initial evaluating in the ED, the patient was hypothermic with a temperature of 94.5. CT scan of the abdomen was done which showed diverticulitis.  Assessment/Plan:   Principal Problem:  Sepsis secondary to Diverticulitis versus recurrent ESBL UTI   Patient recently hospitalized with ESBL Escherichia coli UTI/sepsis. Urinalysis clear, but cultures re-sent.   Mild diverticulitis noted on CT scan of the abdomen and pelvis.   Empirically placed on Zosyn which will cover intra-abdominal infections from diverticulitis as well as ESBL Escherichia coli based on prior sensitivities.   Monitored in the SDU overnight, OK to tx to floor, hemodynamically stable and temperature normalized.  Active Problems:  Candidal intertrigo  Has been on antifungal cream, but growing folds macerated with severe cutaneous candidiasis. Will treat with Diflucan.  Dementia   Hold Aricept and Namenda for now secondary to concerns regarding airway protection.  ST evaluation.  DM (diabetes mellitus)   Hold Glucotrol. Will cover with SSI, insulin sensitive scale every 4 hours while n.p.o. CBGs 89-122.  HTN (hypertension)   Blood pressure soft. Hold hydrochlorothiazide, lisinopril, and metoprolol for now.  Acute encephalopathy   Likely reflective of acute sepsis.  FTT (failure to thrive) in adult   Supportive care. The patient is a DO NOT RESUSCITATE.   Hypothermia   Secondary to sepsis. Bear hugger used 09/29/13. Temperature is now normal to low-grade febrile.  DVT prophylaxis   Lovenox  ordered.  Code Status: DNR  Family Communication: Daughter Elson ClanLetha [phone]) called and updated by telephone 09/29/13. No answer 09/30/13.  Message left. Disposition Plan: SNF when stable.    IV Access:    Peripheral IV   Procedures and diagnostic studies:    Chest x-ray 09/29/13: Borderline cardiomegaly, left lung base atelectasis and low inspiratory effort.  CT of the head 09/29/13: Remote infarcts and small vessel disease type changes without CT evidence of large acute infarct. No intracranial hemorrhage. Atrophy without hydrocephalus. Polypoid opacity occasion left frontal sinus.  CT abdomen and pelvis 09/29/13: Mild changes of left colonic diverticulitis. No evidence of abscess.   Medical Consultants:    None.   Other Consultants:    Speech therapy.  Anti-Infectives:    Zosyn 09/29/13 --->  Subjective:   Desiree Gonzalez  is still not responding verbally.  Objective:    Filed Vitals:   09/30/13 0000 09/30/13 0100 09/30/13 0400 09/30/13 0600  BP: 106/54  125/47   Pulse: 120 104 88 88  Temp: 100.2 F (37.9 C) 98.8 F (37.1 C) 97.5 F (36.4 C) 96.8 F (36 C)  TempSrc: Core (Comment)  Core (Comment)   Resp: 28 22 15 19   Height:      Weight: 92 kg (202 lb 13.2 oz)     SpO2: 89% 95% 98% 98%    Intake/Output Summary (Last 24 hours) at 09/30/13 0711 Last data filed at 09/30/13 0400  Gross per 24 hour  Intake 1429.17 ml  Output   1050 ml  Net 379.17 ml    Exam: Gen:  NAD, non-verbal Cardiovascular:  RRR, No M/R/G Respiratory:  Lungs CTAB Gastrointestinal:  Abdomen soft, NT/ND, +  BS Extremities:  No C/E/C Skin: Severe Candida intertrigo to the groin folds   Data Reviewed:    Labs: Basic Metabolic Panel:  Recent Labs Lab 09/29/13 1229 09/30/13 0339  NA 135* 137  K 4.4 4.6  CL 98 103  CO2 23 21  GLUCOSE 118* 126*  BUN 31* 26*  CREATININE 1.28* 1.39*  CALCIUM 9.6 8.6   GFR Estimated Creatinine Clearance: 32.6 ml/min (by C-G formula  based on Cr of 1.39). Liver Function Tests:  Recent Labs Lab 09/29/13 1229  AST 17  ALT 22  ALKPHOS 106  BILITOT <0.2*  PROT 7.0  ALBUMIN 3.0*   CBC:  Recent Labs Lab 09/29/13 1229 09/30/13 0339  WBC 8.5 10.9*  NEUTROABS 6.3  --   HGB 12.2 11.0*  HCT 36.5 33.4*  MCV 85.5 86.3  PLT 232 210   CBG:  Recent Labs Lab 09/29/13 1622 09/29/13 2000 09/29/13 2329  GLUCAP 122* 89 99   Sepsis Labs:  Recent Labs Lab 09/29/13 1229 09/29/13 1252 09/30/13 0339  WBC 8.5  --  10.9*  LATICACIDVEN  --  1.51  --    Microbiology No results found for this or any previous visit (from the past 240 hour(s)).   Medications:   . antiseptic oral rinse  7 mL Mouth Rinse BID  . enoxaparin (LOVENOX) injection  40 mg Subcutaneous Q24H  . insulin aspart  0-9 Units Subcutaneous 6 times per day  . miconazole  1 application Topical BID  . piperacillin-tazobactam (ZOSYN)  IV  3.375 g Intravenous Q8H   Continuous Infusions: . sodium chloride 100 mL/hr at 09/29/13 2253    Time spent: 35 minutes. The patient requires high complexity decision making.   LOS: 1 day   RAMA,CHRISTINA  [facility] Pager [phone]. If unable to reach me by pager, please call my cell phone at [phone].  *Please refer to [email or web address], password [email or web address] to get updated schedule on who will round on this patient, as hospitalists switch teams weekly. If 7PM-7AM, please contact night-coverage at [email or web address], password [email or web address] for any overnight needs.  09/30/2013, 7:11 AM    **Disclaimer: This note was dictated with voice recognition software. Similar sounding words can inadvertently be transcribed and this note may contain transcription errors which may not have been corrected upon publication of note.**

## 2013-10-01 DIAGNOSIS — N3000 Acute cystitis without hematuria: Secondary | ICD-10-CM

## 2013-10-01 LAB — GLUCOSE, CAPILLARY
GLUCOSE-CAPILLARY: 146 mg/dL — AB (ref 70–99)
Glucose-Capillary: 105 mg/dL — ABNORMAL HIGH (ref 70–99)
Glucose-Capillary: 122 mg/dL — ABNORMAL HIGH (ref 70–99)
Glucose-Capillary: 122 mg/dL — ABNORMAL HIGH (ref 70–99)
Glucose-Capillary: 130 mg/dL — ABNORMAL HIGH (ref 70–99)
Glucose-Capillary: 137 mg/dL — ABNORMAL HIGH (ref 70–99)

## 2013-10-01 LAB — BASIC METABOLIC PANEL
ANION GAP: 11 (ref 5–15)
BUN: 19 mg/dL (ref 6–23)
CO2: 22 mEq/L (ref 19–32)
Calcium: 8.6 mg/dL (ref 8.4–10.5)
Chloride: 106 mEq/L (ref 96–112)
Creatinine, Ser: 1.22 mg/dL — ABNORMAL HIGH (ref 0.50–1.10)
GFR calc Af Amer: 44 mL/min — ABNORMAL LOW (ref >90)
GFR calc non Af Amer: 38 mL/min — ABNORMAL LOW (ref >90)
GLUCOSE: 132 mg/dL — AB (ref 70–99)
POTASSIUM: 4.1 meq/L (ref 3.7–5.3)
SODIUM: 139 meq/L (ref 137–147)

## 2013-10-01 LAB — CBC
HCT: 31.3 % — ABNORMAL LOW (ref 36.0–46.0)
Hemoglobin: 10.3 g/dL — ABNORMAL LOW (ref 12.0–15.0)
MCH: 27.9 pg (ref 26.0–34.0)
MCHC: 32.9 g/dL (ref 30.0–36.0)
MCV: 84.8 fL (ref 78.0–100.0)
Platelets: 228 10*3/uL (ref 150–400)
RBC: 3.69 MIL/uL — AB (ref 3.87–5.11)
RDW: 14.6 % (ref 11.5–15.5)
WBC: 8.6 10*3/uL (ref 4.0–10.5)

## 2013-10-01 MED ORDER — MEMANTINE HCL ER 7 MG PO CP24
14.0000 mg | ORAL_CAPSULE | Freq: Every morning | ORAL | Status: DC
  Filled 2013-10-01 (×2): qty 2

## 2013-10-01 MED ORDER — METOPROLOL TARTRATE 25 MG PO TABS: 25.0000 mg | ORAL_TABLET | Freq: Two times a day (BID) | ORAL | Status: DC

## 2013-10-01 MED ORDER — PSYLLIUM 95 % PO PACK
1.0000 | PACK | Freq: Every morning | ORAL | Status: DC
  Administered 2013-10-03 – 2013-10-10 (×8): 1 via ORAL
  Filled 2013-10-01 (×10): qty 1

## 2013-10-01 MED ORDER — INSULIN ASPART 100 UNIT/ML ~~LOC~~ SOLN
0.0000 [IU] | Freq: Three times a day (TID) | SUBCUTANEOUS | Status: DC
  Administered 2013-10-01 – 2013-10-04 (×3): 1 [IU] via SUBCUTANEOUS
  Administered 2013-10-04: 3 [IU] via SUBCUTANEOUS
  Administered 2013-10-05: 2 [IU] via SUBCUTANEOUS
  Administered 2013-10-05: 3 [IU] via SUBCUTANEOUS
  Administered 2013-10-06: 1 [IU] via SUBCUTANEOUS
  Administered 2013-10-06: 2 [IU] via SUBCUTANEOUS
  Administered 2013-10-06: 1 [IU] via SUBCUTANEOUS
  Administered 2013-10-07: 2 [IU] via SUBCUTANEOUS
  Administered 2013-10-07: 1 [IU] via SUBCUTANEOUS
  Administered 2013-10-07: 2 [IU] via SUBCUTANEOUS
  Administered 2013-10-08: 1 [IU] via SUBCUTANEOUS
  Administered 2013-10-08 – 2013-10-10 (×3): 2 [IU] via SUBCUTANEOUS
  Administered 2013-10-10: 1 [IU] via SUBCUTANEOUS

## 2013-10-01 MED ORDER — LISINOPRIL 10 MG PO TABS: 10.0000 mg | ORAL_TABLET | Freq: Every morning | ORAL | Status: DC

## 2013-10-01 MED ORDER — DONEPEZIL HCL 10 MG PO TABS
10.0000 mg | ORAL_TABLET | Freq: Every day | ORAL | Status: DC
  Filled 2013-10-01 (×2): qty 1

## 2013-10-01 MED ORDER — RESOURCE THICKENUP CLEAR PO POWD
ORAL | Status: DC | PRN
  Administered 2013-10-02: 1 via ORAL
  Filled 2013-10-01 (×2): qty 125

## 2013-10-01 MED ORDER — ASPIRIN 81 MG PO CHEW
81.0000 mg | CHEWABLE_TABLET | Freq: Every morning | ORAL | Status: DC
  Administered 2013-10-03 – 2013-10-10 (×8): 81 mg via ORAL
  Filled 2013-10-01 (×9): qty 1

## 2013-10-01 MED ORDER — INSULIN ASPART 100 UNIT/ML ~~LOC~~ SOLN: 0.0000 [IU] | Freq: Every day | SUBCUTANEOUS | Status: DC

## 2013-10-01 NOTE — Progress Notes (Signed)
Patient not given PO medication d/t coughing with eating.

## 2013-10-01 NOTE — Progress Notes (Signed)
Progress Note   Desiree Gonzalez [id number] DOB: 08/11/1923 DOA: 09/29/2013 PCP: Elie ConferWESTERMANN,CAROLA JO, MD   Brief Narrative:   Desiree Gonzalez is an 80 y.o. female nursing home resident with a PMH of recent hospitalization 09/18/13-09/22/13 for treatment of ESBL Escherichia coli, and dementia who, at baseline is alert and conversant but confused, who was found unresponsive by staff members on 09/29/13. Upon initial evaluating in the ED, the patient was hypothermic with a temperature of 94.5. CT scan of the abdomen was done which showed diverticulitis.  Assessment/Plan:   Principal Problem:  Sepsis secondary to Diverticulitis versus recurrent ESBL UTI   Patient recently hospitalized with ESBL Escherichia coli UTI/sepsis. Urinalysis clear, but cultures re-sent.   Mild diverticulitis noted on CT scan of the abdomen and pelvis.   Empirically placed on Zosyn which will cover intra-abdominal infections from diverticulitis as well as ESBL Escherichia coli based on prior sensitivities.   Continue Zosyn.  Active Problems:  Candidal intertrigo  Has been on antifungal cream, but growing folds macerated with severe cutaneous candidiasis. Continue Diflucan.  Dementia   Resume Aricept and Namenda.  Speech therapy evaluation requested.  DM (diabetes mellitus)   Hold Glucotrol. Will cover with SSI, insulin sensitive scale every 4 hours while n.p.o. CBGs 89-122. Change SSI due to a.c./at bedtime with diet advancement.  HTN (hypertension)   Blood pressure soft. Hold hydrochlorothiazide, lisinopril, and metoprolol for now.  Acute encephalopathy   Likely reflective of acute sepsis.  FTT (failure to thrive) in adult   Supportive care. The patient is a DO NOT RESUSCITATE.   Hypothermia   Secondary to sepsis. Bear hugger used 09/29/13. Temperature is now normal to low-grade febrile.  DVT prophylaxis   Lovenox ordered.  Code Status: DNR  Family Communication: Daughter Elson ClanLetha  [phone]) called and updated by telephone 10/01/13.  Disposition Plan: SNF when stable.    IV Access:    Peripheral IV   Procedures and diagnostic studies:    Chest x-ray 09/29/13: Borderline cardiomegaly, left lung base atelectasis and low inspiratory effort.  CT of the head 09/29/13: Remote infarcts and small vessel disease type changes without CT evidence of large acute infarct. No intracranial hemorrhage. Atrophy without hydrocephalus. Polypoid opacity occasion left frontal sinus.  CT abdomen and pelvis 09/29/13: Mild changes of left colonic diverticulitis. No evidence of abscess.   Medical Consultants:    None.   Other Consultants:    Speech therapy.  Physical therapy.  Anti-Infectives:    Zosyn 09/29/13 --->  Subjective:   Desiree Gonzalez  is awake and responsive, answering questions appropriately. She denies shortness of breath, pain, nausea/vomiting. Answers "yes" when asked if she is hungry.  Objective:    Filed Vitals:   09/30/13 0849 09/30/13 1215 09/30/13 2004 10/01/13 0516  BP:  112/51 135/72 111/54  Pulse: 82 72 84 91  Temp: 97 F (36.1 C) 97.5 F (36.4 C) 98.2 F (36.8 C) 98.8 F (37.1 C)  TempSrc:  Axillary Axillary Axillary  Resp: 16 16 16 16   Height:      Weight:  92.8 kg (204 lb 9.4 oz)    SpO2: 96% 96% 97% 93%    Intake/Output Summary (Last 24 hours) at 10/01/13 0804 Last data filed at 10/01/13 0558  Gross per 24 hour  Intake 653.33 ml  Output   1700 ml  Net -1046.67 ml    Exam: Gen:  NAD, awake, verbally responsive Cardiovascular:  RRR, No M/R/G Respiratory:  Lungs CTAB Gastrointestinal:  Abdomen soft, NT/ND, + BS Extremities:  No C/E/C  Data Reviewed:    Labs: Basic Metabolic Panel:  Recent Labs Lab 09/29/13 1229 09/30/13 0339 10/01/13 0541  NA 135* 137 139  K 4.4 4.6 4.1  CL 98 103 106  CO2 23 21 22   GLUCOSE 118* 126* 132*  BUN 31* 26* 19  CREATININE 1.28* 1.39* 1.22*  CALCIUM 9.6 8.6 8.6    GFR Estimated Creatinine Clearance: 37.3 ml/min (by C-G formula based on Cr of 1.22). Liver Function Tests:  Recent Labs Lab 09/29/13 1229  AST 17  ALT 22  ALKPHOS 106  BILITOT <0.2*  PROT 7.0  ALBUMIN 3.0*   CBC:  Recent Labs Lab 09/29/13 1229 09/30/13 0339 10/01/13 0541  WBC 8.5 10.9* 8.6  NEUTROABS 6.3  --   --   HGB 12.2 11.0* 10.3*  HCT 36.5 33.4* 31.3*  MCV 85.5 86.3 84.8  PLT 232 210 228   CBG:  Recent Labs Lab 09/30/13 1643 09/30/13 2002 10/01/13 0008 10/01/13 0355 10/01/13 0749  GLUCAP 95 113* 105* 122* 130*   Sepsis Labs:  Recent Labs Lab 09/29/13 1229 09/29/13 1252 09/30/13 0339 10/01/13 0541  WBC 8.5  --  10.9* 8.6  LATICACIDVEN  --  1.51  --   --    Microbiology Recent Results (from the past 240 hour(s))  CULTURE, BLOOD (ROUTINE X 2)     Status: None   Collection Time    09/29/13 12:29 PM      Result Value Ref Range Status   Specimen Description BLOOD LEFT HAND   Final   Special Requests BOTTLES DRAWN AEROBIC ONLY 2CC   Final   Culture  Setup Time     Final   Value: 09/29/2013 14:07     Performed at [facility]   Culture     Final   Value:        BLOOD CULTURE RECEIVED NO GROWTH TO DATE CULTURE WILL BE HELD FOR 5 DAYS BEFORE ISSUING A FINAL NEGATIVE REPORT     Performed at [facility]   Report Status PENDING   Incomplete  CULTURE, BLOOD (ROUTINE X 2)     Status: None   Collection Time    09/29/13 12:32 PM      Result Value Ref Range Status   Specimen Description BLOOD RIGHT ARM   Final   Special Requests BOTTLES DRAWN AEROBIC AND ANAEROBIC [facility] EACH   Final   Culture  Setup Time     Final   Value: 09/29/2013 14:07     Performed at [facility]   Culture     Final   Value:        BLOOD CULTURE RECEIVED NO GROWTH TO DATE CULTURE WILL BE HELD FOR 5 DAYS BEFORE ISSUING A FINAL NEGATIVE REPORT     Performed at [facility]   Report Status PENDING   Incomplete  URINE CULTURE     Status: None    Collection Time    09/29/13  1:39 PM      Result Value Ref Range Status   Specimen Description URINE, CATHETERIZED   Final   Special Requests NONE   Final   Culture  Setup Time     Final   Value: 09/29/2013 17:23     Performed at [facility] Count     Final   Value: NO GROWTH     Performed at [facility]  Final   Value: NO GROWTH     Performed at [facility]   Report Status 09/30/2013 FINAL   Final     Medications:   . antiseptic oral rinse  7 mL Mouth Rinse BID  . enoxaparin (LOVENOX) injection  40 mg Subcutaneous Q24H  . fluconazole (DIFLUCAN) IV  100 mg Intravenous Q24H  . insulin aspart  0-9 Units Subcutaneous 6 times per day  . miconazole  1 application Topical BID  . piperacillin-tazobactam (ZOSYN)  IV  3.375 g Intravenous Q8H   Continuous Infusions: . sodium chloride 100 mL/hr at 10/01/13 0758    Time spent: 25 minutes.   LOS: 2 days   Qaadir Kent  [facility] Pager [phone]. If unable to reach me by pager, please call my cell phone at [phone].  *Please refer to [email or web address], password [email or web address] to get updated schedule on who will round on this patient, as hospitalists switch teams weekly. If 7PM-7AM, please contact night-coverage at [email or web address], password [email or web address] for any overnight needs.  10/01/2013, 8:04 AM    **Disclaimer: This note was dictated with voice recognition software. Similar sounding words can inadvertently be transcribed and this note may contain transcription errors which may not have been corrected upon publication of note.**

## 2013-10-01 NOTE — Evaluation (Signed)
Clinical/Bedside Swallow Evaluation Patient Details  Name: Alveria Apleystelle M Safranek MRN: [id number] Date of Birth: Dec 18, 1923  Today's Date: 10/01/2013 Time: [id number] SLP Time Calculation (min): 19 min  Past Medical History:  Past Medical History  Diagnosis Date  . CVA (cerebral infarction)   . Alzheimer's dementia   . GERD (gastroesophageal reflux disease)   . Hypertension   . Osteopenia   . Diabetes mellitus without complication    Past Surgical History: History reviewed. No pertinent past surgical history. HPI:  80 year old female admitted 09/29/13 due to unresponsiveness.  PMH significant for dementia, CVA, GERD.   Assessment / Plan / Recommendation Clinical Impression  Pt demonstrates delayed cough with thin liquids given in isolation, indicative of decreased airway protection and aspiration, likely partially silent. With trials of nectar and puree, given over multiple trials, pt had no overt evidence of aspiration. Pt would not phonate or respond verbally to questions, so vocal quailty post swallow could not be assessed. Recommend pt downgrade to dys 1/puree diet with nectar thick liquids to reduce risk of aspiration. SLP will f/u for tolerance and possible upgrade as mentation continues to improve. If pt demosntrates continuing signs of aspiration with this diet, make NPO, SLP will f/u.     Aspiration Risk  Moderate    Diet Recommendation Dysphagia 1 (Puree);Nectar-thick liquid   Liquid Administration via: Straw;Cup Medication Administration: Whole meds with puree Supervision: Staff to assist with self feeding Compensations: Slow rate;Small sips/bites Postural Changes and/or Swallow Maneuvers: Seated upright 90 degrees    Other  Recommendations Oral Care Recommendations: Oral care BID Other Recommendations: Order thickener from pharmacy;Prohibited food (jello, ice cream, thin soups)   Follow Up Recommendations  Skilled Nursing facility    Frequency and Duration min 2x/week  2  weeks   Pertinent Vitals/Pain NA    SLP Swallow Goals     Swallow Study Prior Functional Status       General Date of Onset: 09/29/13 HPI: 80 year old female admitted 09/29/13 due to unresponsiveness.  PMH significant for dementia, CVA, GERD. Type of Study: Bedside swallow evaluation Previous Swallow Assessment: none found Diet Prior to this Study: Thin liquids (SOFT) Temperature Spikes Noted: No Respiratory Status: Room air History of Recent Intubation: No Behavior/Cognition: Alert;Confused Oral Cavity - Dentition: Adequate natural dentition Self-Feeding Abilities: Total assist Patient Positioning: Upright in bed Baseline Vocal Quality: Low vocal intensity Volitional Cough: Cognitively unable to elicit Volitional Swallow: Unable to elicit    Oral/Motor/Sensory Function Overall Oral Motor/Sensory Function: Other (comment) (Pt does not follow commands)   Ice Chips     Thin Liquid Thin Liquid: Impaired Presentation: Cup;Straw;Self Fed Oral Phase Functional Implications: Oral holding;Prolonged oral transit Pharyngeal  Phase Impairments: Suspected delayed Swallow;Cough - Delayed    Nectar Thick Nectar Thick Liquid: Impaired Presentation: Cup;Straw Oral phase functional implications: Prolonged oral transit;Oral holding Pharyngeal Phase Impairments: Suspected delayed Swallow   Honey Thick Honey Thick Liquid: Not tested   Puree Puree: Impaired Presentation: Spoon Oral Phase Functional Implications: Prolonged oral transit;Oral holding Pharyngeal Phase Impairments: Suspected delayed Swallow   Solid   GO    Solid: Not tested      Harlon DittyBonnie Markala Sitts, MA CCC-SLP [phone]  Claudine MoutonDeBlois, Derya Dettmann Caroline 10/01/2013,2:26 PM

## 2013-10-02 ENCOUNTER — Encounter (HOSPITAL_COMMUNITY): Payer: Self-pay | Admitting: Internal Medicine

## 2013-10-02 DIAGNOSIS — N183 Chronic kidney disease, stage 3 unspecified: Secondary | ICD-10-CM

## 2013-10-02 DIAGNOSIS — T7589XS Other specified effects of external causes, sequela: Secondary | ICD-10-CM

## 2013-10-02 HISTORY — DX: Chronic kidney disease, stage 3 unspecified: N18.30

## 2013-10-02 LAB — GLUCOSE, CAPILLARY
GLUCOSE-CAPILLARY: 127 mg/dL — AB (ref 70–99)
GLUCOSE-CAPILLARY: 112 mg/dL — AB (ref 70–99)
Glucose-Capillary: 108 mg/dL — ABNORMAL HIGH (ref 70–99)
Glucose-Capillary: 121 mg/dL — ABNORMAL HIGH (ref 70–99)

## 2013-10-02 MED ORDER — METOPROLOL TARTRATE 25 MG PO TABS
25.0000 mg | ORAL_TABLET | Freq: Two times a day (BID) | ORAL | Status: DC
  Administered 2013-10-02 – 2013-10-10 (×16): 25 mg via ORAL
  Filled 2013-10-02 (×17): qty 1

## 2013-10-02 MED ORDER — HYDRALAZINE HCL 10 MG PO TABS
10.0000 mg | ORAL_TABLET | Freq: Four times a day (QID) | ORAL | Status: DC | PRN
  Administered 2013-10-02 – 2013-10-05 (×2): 10 mg via ORAL
  Filled 2013-10-02 (×2): qty 1

## 2013-10-02 NOTE — Progress Notes (Signed)
ANTIBIOTIC CONSULT NOTE - Follow-up  Pharmacy Consult for Zosyn Indication: Recurrent UTI/E coli  No Known Allergies  Patient Measurements: Height: 5\' 7"  (170.2 cm) Weight: 204 lb 9.4 oz (92.8 kg) IBW/kg (Calculated) : 61.6  Vital Signs: Temp: 98.6 F (37 C) (08/02 1000) Temp src: Core (Comment) (08/02 0726) BP: 141/51 mmHg (08/02 1000) Pulse Rate: 82 (08/02 1000) Intake/Output from previous day: 08/01 0701 - 08/02 0700 In: 180 [P.O.:180] Out: 1450 [Urine:1450] Intake/Output from this shift:    Labs:  Recent Labs  09/29/13 1229 09/30/13 0339 10/01/13 0541  WBC 8.5 10.9* 8.6  HGB 12.2 11.0* 10.3*  PLT 232 210 228  CREATININE 1.28* 1.39* 1.22*   Estimated Creatinine Clearance: 37.3 ml/min (by C-G formula based on Cr of 1.22). No results found for this basename: VANCOTROUGH, Leodis Binet, VANCORANDOM, GENTTROUGH, GENTPEAK, GENTRANDOM, TOBRATROUGH, TOBRAPEAK, TOBRARND, AMIKACINPEAK, AMIKACINTROU, AMIKACIN,  in the last 72 hours   Microbiology: Recent Results (from the past 720 hour(s))  URINE CULTURE     Status: None   Collection Time    09/18/13  8:29 AM      Result Value Ref Range Status   Specimen Description URINE, CATHETERIZED   Final   Special Requests NONE   Final   Culture  Setup Time     Final   Value: 09/18/2013 19:51     Performed at [facility] Count     Final   Value: >=100,000 COLONIES/ML     Performed at [facility]   Culture     Final   Value: ESCHERICHIA COLI     Performed at [facility]   Report Status 09/21/2013 FINAL   Final   Organism ID, Bacteria ESCHERICHIA COLI   Final  CULTURE, BLOOD (ROUTINE X 2)     Status: None   Collection Time    09/18/13  9:04 AM      Result Value Ref Range Status   Specimen Description BLOOD LEFT HAND   Final   Special Requests BOTTLES DRAWN AEROBIC AND ANAEROBIC 1CC   Final   Culture  Setup Time     Final   Value: 09/18/2013 18:06     Performed at [facility]   Culture     Final   Value: NO GROWTH 5 DAYS     Performed at [facility]   Report Status 09/24/2013 FINAL   Final  CULTURE, BLOOD (ROUTINE X 2)     Status: None   Collection Time    09/18/13  9:34 AM      Result Value Ref Range Status   Specimen Description BLOOD RIGHT ARM   Final   Special Requests BOTTLES DRAWN AEROBIC AND ANAEROBIC 5CC   Final   Culture  Setup Time     Final   Value: 09/18/2013 18:07     Performed at [facility]   Culture     Final   Value: NO GROWTH 5 DAYS     Performed at [facility]   Report Status 09/24/2013 FINAL   Final  URINE CULTURE     Status: None   Collection Time    09/18/13 12:14 PM      Result Value Ref Range Status   Specimen Description URINE, CATHETERIZED   Final   Special Requests NONE   Final   Culture  Setup Time     Final   Value: 09/18/2013 19:51     Performed at [facility]  Colony Count     Final   Value: 25,000 COLONIES/ML     [facility]   Culture     Final   Value: ESCHERICHIA COLI     [facility]   Report Status 09/21/2013 FINAL   Final   Organism ID, Bacteria ESCHERICHIA COLI   Final  MRSA PCR SCREENING     Status: None   Collection Time    09/18/13 12:17 PM      Result Value Ref Range Status   MRSA by PCR NEGATIVE  NEGATIVE Final   Comment:            The GeneXpert MRSA Assay (FDA     approved for NASAL specimens     only), is one component of a     comprehensive MRSA colonization     surveillance program. It is not     intended to diagnose MRSA     infection nor to guide or     monitor treatment for     MRSA infections.  CULTURE, BLOOD (ROUTINE X 2)     Status: None   Collection Time    09/18/13  3:54 PM      Result Value Ref Range Status   Specimen Description BLOOD RIGHT HAND   Final   Special Requests BOTTLES DRAWN AEROBIC AND ANAEROBIC 10 CC   Final   Culture  Setup Time     Final   Value: 09/19/2013 08:44     [facility]formed [facility]   Culture     Final   Value: NO GROWTH 5 DAYS     [facility]   Report Status 09/25/2013 FINAL   Final  CULTURE, BLOOD (ROUTINE X 2)     Status: None   Collection Time    09/18/13  4:13 PM      Result Value Ref Range Status   Specimen Description BLOOD LEFT HAND   Final   Special Requests BOTTLES DRAWN AEROBIC AND ANAEROBIC 10CC   Final   Culture  Setup Time     Final   Value: 09/19/2013 08:45     [facility]   Culture     Final   Value: NO GROWTH 5 DAYS     [facility]   Report Status 09/25/2013 FINAL   Final  CULTURE, BLOOD (ROUTINE X 2)     Status: None   Collection Time    09/29/13 12:29 PM      Result Value Ref Range Status   Specimen Description BLOOD LEFT HAND   Final   Special Requests BOTTLES DRAWN AEROBIC ONLY 2CC   Final   Culture  Setup Time     Final   Value: 09/29/2013 14:07     [facility]   Culture     Final   Value:        BLOOD CULTURE RECEIVED NO GROWTH TO DATE CULTURE WILL BE HELD FOR 5 DAYS BEFORE ISSUING A FINAL NEGATIVE REPORT     [facility]   Report Status PENDING   Incomplete  CULTURE, BLOOD (ROUTINE X 2)     Status: None   Collection Time    09/29/13 12:32 PM      Result Value Ref Range Status   Specimen Description BLOOD RIGHT ARM   Final   Special Requests BOTTLES DRAWN AEROBIC AND ANAEROBIC 3CC  EACH   Final   Culture  Setup Time     Final   Value: 09/29/2013 14:07     Performed at [facility]   Culture     Final   Value:        BLOOD CULTURE RECEIVED NO GROWTH TO DATE CULTURE WILL BE HELD FOR 5 DAYS BEFORE ISSUING A FINAL NEGATIVE REPORT     Performed at [facility]   Report Status PENDING   Incomplete  URINE CULTURE     Status: None   Collection Time    09/29/13  1:39 PM      Result Value Ref Range Status   Specimen Description URINE, CATHETERIZED   Final   Special Requests NONE   Final    Culture  Setup Time     Final   Value: 09/29/2013 17:23     Performed at [facility] Count     Final   Value: NO GROWTH     Performed at [facility]   Culture     Final   Value: NO GROWTH     Performed at [facility]   Report Status 09/30/2013 FINAL   Final   Medical History: Past Medical History  Diagnosis Date  . CVA (cerebral infarction)   . Alzheimer's dementia   . GERD (gastroesophageal reflux disease)   . Hypertension   . Osteopenia   . Diabetes mellitus without complication   . Stage III chronic kidney disease 10/02/2013   Medications:  Scheduled:  . antiseptic oral rinse  7 mL Mouth Rinse BID  . aspirin  81 mg Oral q morning - 10a  . enoxaparin (LOVENOX) injection  40 mg Subcutaneous Q24H  . fluconazole (DIFLUCAN) IV  100 mg Intravenous Q24H  . insulin aspart  0-5 Units Subcutaneous QHS  . insulin aspart  0-9 Units Subcutaneous TID WC  . miconazole  1 application Topical BID  . piperacillin-tazobactam (ZOSYN)  IV  3.375 g Intravenous Q8H  . psyllium  1 packet Oral q morning - 10a   Anti-infectives   Start     Dose/Rate Route Frequency Ordered Stop   09/30/13 1000  fluconazole (DIFLUCAN) IVPB 100 mg     100 mg 50 mL/hr over 60 Minutes Intravenous Every 24 hours 09/30/13 0844     09/29/13 2000  ciprofloxacin (CIPRO) IVPB 400 mg  Status:  Discontinued     400 mg 200 mL/hr over 60 Minutes Intravenous Every 12 hours 09/29/13 1540 09/29/13 1550   09/29/13 2000  metroNIDAZOLE (FLAGYL) IVPB 500 mg  Status:  Discontinued     500 mg 100 mL/hr over 60 Minutes Intravenous Every 8 hours 09/29/13 1540 09/29/13 1550   09/29/13 2000  piperacillin-tazobactam (ZOSYN) IVPB 3.375 g     3.375 g 12.5 mL/hr over 240 Minutes Intravenous Every 8 hours 09/29/13 1610     09/29/13 1245  vancomycin (VANCOCIN) IVPB 1000 mg/200 mL premix     1,000 mg 200 mL/hr over 60 Minutes Intravenous  Once 09/29/13 1234 09/29/13 1420   09/29/13 1245   piperacillin-tazobactam (ZOSYN) IVPB 3.375 g     3.375 g 100 mL/hr over 30 Minutes Intravenous  Once 09/29/13 1234 09/29/13 1316     Assessment: 89 yoF unresponsive at SNF, to ED 7/30, admit for IV abx-likely urosepsis. Last admit 7/19-7/23 for urosepsis 2/2 E coli in urine treated with Zosyn, continue Cipro at [facility].   7/30 >> Zosyn >> 7/30 >> Vanc  x1  7/31 >> Fluconazole >>  Tmax: AF with some low temps WBCs: WNL Renal: SCr back to baseline (1.1-1.2), Cl ~ 37CG / 47N  12/11/12 urine: E coli ESBL - ordered Septra 7/19 urine: 25K E coli NOT ESBL - treated w/Zosyn, then to SNF on Cipro 7/30 blood: NGTD 7/30 urine: NGF  8/2:  D4 Zosyn EI for sepsis 2/2 recurrent UTI vs diverticulitis / D3 Fluconazole 100mg  IV q24h for severe cutaneous candidiasis. Watch renal fxn. Tx to SDU overnight 2/2 recurrent hypothermia.   Goal of Therapy:  Antibiotic/dose/schedule appropriate for renal function, pathogen, disease state  Plan:   Continue Zosyn 3.375g IV q8h (infuse over 4 hours)  Haynes Hoehn, PharmD, BCPS 10/02/2013, 10:59 AM  Pager: [phone]

## 2013-10-02 NOTE — Evaluation (Signed)
Physical Therapy Evaluation Patient Details Name: Desiree Gonzalez MRN: [id number] DOB: 05/11/1923 Today's Date: 10/02/2013   History of Present Illness  Desiree Apleystelle M Filosa is a 80 y.o. female with a past medical history of dementia, currently nursing home resident who was found unresponsive and brought to the ER on 09/29/13. Pt found to be hypothermic.  Clinical Impression  Pt aroused and sat at the edge of the bed. Pt is much weaker than when in hospital last visit. Pt will benefit from PT while in acute care to address problems listed in note below.    Follow Up Recommendations SNF;Supervision/Assistance - 24 hour    Equipment Recommendations  None recommended by PT    Recommendations for Other Services       Precautions / Restrictions Precautions Precautions: Fall Precaution Comments: incontinenece      Mobility  Bed Mobility Overal bed mobility: Needs Assistance Bed Mobility: Rolling;Supine to Sit;Sit to Supine Rolling: +2 for safety/equipment;+2 for physical assistance;Total assist   Supine to sit: Total assist;+2 for safety/equipment;+2 for physical assistance;HOB elevated Sit to supine: Total assist;+2 for physical assistance;+2 for safety/equipment   General bed mobility comments: pt did folow simple commands, delayed responses  Transfers                    Ambulation/Gait                Stairs            Wheelchair Mobility    Modified Rankin (Stroke Patients Only)       Balance Overall balance assessment: Needs assistance;History of Falls Sitting-balance support: Bilateral upper extremity supported;Feet supported Sitting balance-Leahy Scale: Poor                                       Pertinent Vitals/Pain No c/o, VSS    Home Living Family/patient expects to be discharged to:: Skilled nursing facility                      Prior Function           Comments: pt unable to provide and no family present      Hand Dominance        Extremity/Trunk Assessment   Upper Extremity Assessment: Generalized weakness           Lower Extremity Assessment: Generalized weakness         Communication   Communication: HOH (delayed, only spoke a few words)  Cognition Arousal/Alertness: Awake/alert Behavior During Therapy: Flat affect Overall Cognitive Status: History of cognitive impairments - at baseline                      General Comments      Exercises        Assessment/Plan    PT Assessment Patient needs continued PT services  PT Diagnosis Generalized weakness   PT Problem List Decreased strength;Decreased activity tolerance;Decreased mobility;Decreased cognition;Decreased safety awareness  PT Treatment Interventions DME instruction;Functional mobility training;Therapeutic activities   PT Goals (Current goals can be found in the Care Plan section) Acute Rehab PT Goals Patient Stated Goal: none stated PT Goal Formulation: Patient unable to participate in goal setting Time For Goal Achievement: 10/16/13 Potential to Achieve Goals: Fair    Frequency Min 2X/week   Barriers to discharge        Co-evaluation  End of Session   Activity Tolerance: Patient tolerated treatment well Patient left: in bed;with call bell/phone within reach Nurse Communication: Mobility status         Time: 1330-1350 PT Time Calculation (min): 20 min   Charges:   PT Evaluation $Initial PT Evaluation Tier I: 1 Procedure PT Treatments $Therapeutic Activity: 8-22 mins   PT G Codes:          Rada Hay 10/02/2013, 2:46 PM

## 2013-10-02 NOTE — Progress Notes (Addendum)
Progress Note   Desiree Gonzalez ION:[id number] DOB: 1923/10/04 DOA: 09/29/2013 PCP: Elie ConferWESTERMANN,CAROLA JO, MD   Brief Narrative:   Desiree Gonzalez is an 80 y.o. female nursing home resident with a PMH of recent hospitalization 09/18/13-09/22/13 for treatment of ESBL Escherichia coli, and dementia who, at baseline is alert and conversant but confused, who was found unresponsive by staff members on 09/29/13. Upon initial evaluation in the ED, the patient was hypothermic with a temperature of 94.5. CT scan of the abdomen was done which showed diverticulitis.  Assessment/Plan:   Principal Problem:  Sepsis secondary to Diverticulitis versus recurrent ESBL UTI / hypothermia  Patient recently hospitalized with ESBL Escherichia coli UTI/sepsis. Urinalysis clear, but cultures re-sent.   Mild diverticulitis noted on CT scan of the abdomen and pelvis.   Empirically placed on Zosyn which will cover intra-abdominal infections from diverticulitis as well as ESBL Escherichia coli based on prior sensitivities.   Continue Zosyn.  Transferred to the SDU overnight secondary to recurrent hypothermia. Placed on a bear hugger.  Active Problems:  Hypothermia  TSH checked 09/18/13 and was 6.090. Free T4 was 1.80 at that time.  Hold Aricept and Namenda in case these medications are contributory.  Stage III chronic kidney disease  Baseline creatinine around 1.1-1.2.  Avoid nephrotoxins.  Candidal intertrigo  Has been on antifungal cream, but groin folds macerated with severe cutaneous candidiasis. Continue Diflucan.  Dementia / dysphagia   Discontinue Aricept and Namenda in light of recurrent hypothermia.  Seen by speech therapist 10/01/13. Dysphagia 1 diet with nectar thickened liquids recommended.  DM (diabetes mellitus)   Hold Glucotrol. Continue SSI, insulin sensitive scale q. a.c./at bedtime. CBGs 122-146.  HTN (hypertension)   Blood pressure soft. Hold hydrochlorothiazide, lisinopril,  and metoprolol for now.  Acute encephalopathy   Likely reflective of acute sepsis.  FTT (failure to thrive) in adult   Supportive care. The patient is a DO NOT RESUSCITATE.   DVT prophylaxis   Lovenox ordered.  Code Status: DNR  Family Communication: Daughter Elson ClanLetha [phone]) called and updated by telephone 10/02/13.  Disposition Plan: SNF when stable.    IV Access:    Peripheral IV   Procedures and diagnostic studies:    Chest x-ray 09/29/13: Borderline cardiomegaly, left lung base atelectasis and low inspiratory effort.  CT of the head 09/29/13: Remote infarcts and small vessel disease type changes without CT evidence of large acute infarct. No intracranial hemorrhage. Atrophy without hydrocephalus. Polypoid opacity occasion left frontal sinus.  CT abdomen and pelvis 09/29/13: Mild changes of left colonic diverticulitis. No evidence of abscess.   Medical Consultants:    None.   Other Consultants:    Speech therapy: Dysphagia 1 diet with nectar thickened liquids recommended.  Physical therapy.  Anti-Infectives:    Zosyn 09/29/13 --->  Subjective:   Desiree Gonzalez  is more lethargic than yesterday, but does answer simple questions. No nausea or vomiting.  Objective:    Filed Vitals:   10/01/13 0516 10/01/13 1350 10/02/13 0444 10/02/13 0606  BP: 111/54 126/59 171/64   Pulse: 91 94 85   Temp: 98.8 F (37.1 C) 97.9 F (36.6 C) 96.3 F (35.7 C) 95.7 F (35.4 C)  TempSrc: Axillary Axillary Axillary Rectal  Resp: 16 18 20    Height:      Weight:      SpO2: 93% 96% 99%     Intake/Output Summary (Last 24 hours) at 10/02/13 0739 Last data filed at 10/02/13 0452  Gross per  24 hour  Intake    180 ml  Output   1450 ml  Net  -1270 ml    Exam: Gen:  NAD, slightly lethargic but awakens to voice, verbally responsive Cardiovascular:  RRR, No M/R/G Respiratory:  Lungs CTAB Gastrointestinal:  Abdomen soft, NT/ND, + BS Extremities:  No C/E/C  Data  Reviewed:    Labs: Basic Metabolic Panel:  Recent Labs Lab 09/29/13 1229 09/30/13 0339 10/01/13 0541  NA 135* 137 139  K 4.4 4.6 4.1  CL 98 103 106  CO2 23 21 22   GLUCOSE 118* 126* 132*  BUN 31* 26* 19  CREATININE 1.28* 1.39* 1.22*  CALCIUM 9.6 8.6 8.6   GFR Estimated Creatinine Clearance: 37.3 ml/min (by C-G formula based on Cr of 1.22). Liver Function Tests:  Recent Labs Lab 09/29/13 1229  AST 17  ALT 22  ALKPHOS 106  BILITOT <0.2*  PROT 7.0  ALBUMIN 3.0*   CBC:  Recent Labs Lab 09/29/13 1229 09/30/13 0339 10/01/13 0541  WBC 8.5 10.9* 8.6  NEUTROABS 6.3  --   --   HGB 12.2 11.0* 10.3*  HCT 36.5 33.4* 31.3*  MCV 85.5 86.3 84.8  PLT 232 210 228   CBG:  Recent Labs Lab 10/01/13 0355 10/01/13 0749 10/01/13 1158 10/01/13 1607 10/01/13 2102  GLUCAP 122* 130* 122* 137* 146*   Sepsis Labs:  Recent Labs Lab 09/29/13 1229 09/29/13 1252 09/30/13 0339 10/01/13 0541  WBC 8.5  --  10.9* 8.6  LATICACIDVEN  --  1.51  --   --    Microbiology Recent Results (from the past 240 hour(s))  CULTURE, BLOOD (ROUTINE X 2)     Status: None   Collection Time    09/29/13 12:29 PM      Result Value Ref Range Status   Specimen Description BLOOD LEFT HAND   Final   Special Requests BOTTLES DRAWN AEROBIC ONLY 2CC   Final   Culture  Setup Time     Final   Value: 09/29/2013 14:07     Performed at [facility]   Culture     Final   Value:        BLOOD CULTURE RECEIVED NO GROWTH TO DATE CULTURE WILL BE HELD FOR 5 DAYS BEFORE ISSUING A FINAL NEGATIVE REPORT     Performed at [facility]   Report Status PENDING   Incomplete  CULTURE, BLOOD (ROUTINE X 2)     Status: None   Collection Time    09/29/13 12:32 PM      Result Value Ref Range Status   Specimen Description BLOOD RIGHT ARM   Final   Special Requests BOTTLES DRAWN AEROBIC AND ANAEROBIC [facility] EACH   Final   Culture  Setup Time     Final   Value: 09/29/2013 14:07     Performed at [facility]   Culture     Final   Value:        BLOOD CULTURE RECEIVED NO GROWTH TO DATE CULTURE WILL BE HELD FOR 5 DAYS BEFORE ISSUING A FINAL NEGATIVE REPORT     Performed at [facility]   Report Status PENDING   Incomplete  URINE CULTURE     Status: None   Collection Time    09/29/13  1:39 PM      Result Value Ref Range Status   Specimen Description URINE, CATHETERIZED   Final   Special Requests NONE   Final   Culture  Setup Time  Final   Value: 09/29/2013 17:23     Performed at [facility] Count     Final   Value: NO GROWTH     Performed at [facility]   Culture     Final   Value: NO GROWTH     Performed at [facility]   Report Status 09/30/2013 FINAL   Final     Medications:   . antiseptic oral rinse  7 mL Mouth Rinse BID  . aspirin  81 mg Oral q morning - 10a  . donepezil  10 mg Oral QHS  . enoxaparin (LOVENOX) injection  40 mg Subcutaneous Q24H  . fluconazole (DIFLUCAN) IV  100 mg Intravenous Q24H  . insulin aspart  0-5 Units Subcutaneous QHS  . insulin aspart  0-9 Units Subcutaneous TID WC  . Memantine HCl ER  14 mg Oral q morning - 10a  . miconazole  1 application Topical BID  . piperacillin-tazobactam (ZOSYN)  IV  3.375 g Intravenous Q8H  . psyllium  1 packet Oral q morning - 10a   Continuous Infusions: . sodium chloride 10 mL/hr at 10/01/13 1330    Time spent: 35 minutes, the patient is medically complex and requires high complexity decision making.   LOS: 3 days   Aidric Endicott  [facility] Pager [phone]. If unable to reach me by pager, please call my cell phone at [phone].  *Please refer to [email or web address], password [email or web address] to get updated schedule on who will round on this patient, as hospitalists switch teams weekly. If 7PM-7AM, please contact night-coverage at [email or web address], password [email or web address] for any overnight needs.  10/02/2013, 7:39 AM    **Disclaimer: This note was dictated with voice recognition  software. Similar sounding words can inadvertently be transcribed and this note may contain transcription errors which may not have been corrected upon publication of note.**

## 2013-10-03 ENCOUNTER — Inpatient Hospital Stay (HOSPITAL_COMMUNITY): Payer: Medicare Other

## 2013-10-03 LAB — GLUCOSE, CAPILLARY
GLUCOSE-CAPILLARY: 105 mg/dL — AB (ref 70–99)
GLUCOSE-CAPILLARY: 111 mg/dL — AB (ref 70–99)
GLUCOSE-CAPILLARY: 109 mg/dL — AB (ref 70–99)

## 2013-10-03 LAB — CBC
HEMATOCRIT: 29.2 % — AB (ref 36.0–46.0)
HEMOGLOBIN: 10.1 g/dL — AB (ref 12.0–15.0)
MCH: 29.3 pg (ref 26.0–34.0)
MCHC: 34.6 g/dL (ref 30.0–36.0)
MCV: 84.6 fL (ref 78.0–100.0)
Platelets: 193 10*3/uL (ref 150–400)
RBC: 3.45 MIL/uL — ABNORMAL LOW (ref 3.87–5.11)
RDW: 14.4 % (ref 11.5–15.5)
WBC: 7.2 10*3/uL (ref 4.0–10.5)

## 2013-10-03 LAB — BASIC METABOLIC PANEL
Anion gap: 13 (ref 5–15)
BUN: 16 mg/dL (ref 6–23)
CO2: 22 mEq/L (ref 19–32)
Calcium: 8.8 mg/dL (ref 8.4–10.5)
Chloride: 103 mEq/L (ref 96–112)
Creatinine, Ser: 1.13 mg/dL — ABNORMAL HIGH (ref 0.50–1.10)
GFR calc Af Amer: 49 mL/min — ABNORMAL LOW (ref >90)
GFR calc non Af Amer: 42 mL/min — ABNORMAL LOW (ref >90)
Glucose, Bld: 126 mg/dL — ABNORMAL HIGH (ref 70–99)
Potassium: 3.8 mEq/L (ref 3.7–5.3)
Sodium: 138 mEq/L (ref 137–147)

## 2013-10-03 LAB — T3, FREE: T3 FREE: 2.4 pg/mL (ref 2.3–4.2)

## 2013-10-03 LAB — CORTISOL: Cortisol, Plasma: 11.4 ug/dL

## 2013-10-03 LAB — T4, FREE: Free T4: 1.31 ng/dL (ref 0.80–1.80)

## 2013-10-03 LAB — TSH: TSH: 4.89 u[IU]/mL — AB (ref 0.350–4.500)

## 2013-10-03 MED ORDER — LISINOPRIL 10 MG PO TABS
10.0000 mg | ORAL_TABLET | Freq: Every morning | ORAL | Status: DC
  Administered 2013-10-03 – 2013-10-05 (×3): 10 mg via ORAL
  Filled 2013-10-03 (×3): qty 1

## 2013-10-03 NOTE — Progress Notes (Addendum)
Progress Note   Desiree Gonzalez ZOX:[id number] DOB: [birth year] DOA: 09/29/2013 PCP: Elie Confer, MD   Brief Narrative:   Desiree Gonzalez is an 80 y.o. female nursing home resident with a PMH of recent hospitalization 09/18/13-09/22/13 for treatment of ESBL Escherichia coli, and dementia who, at baseline is alert and conversant but confused, who was found unresponsive by staff members on 09/29/13. Upon initial evaluation in the ED, the patient was hypothermic with a temperature of 94.5. CT scan of the abdomen was done which showed diverticulitis. The patient showed signs of improvement by 10/01/2013, but then developed recurrent hyperthermia necessitating transfer back to the SDU for treatment with the bear hugger.  Assessment/Plan:   Principal Problem:  Sepsis secondary to Diverticulitis versus recurrent ESBL UTI / hypothermia  Patient recently hospitalized with ESBL Escherichia coli UTI/sepsis. Urinalysis clear, urine culture negative.   Mild diverticulitis noted on CT scan of the abdomen and pelvis.   Empirically placed on Zosyn which will cover intra-abdominal infections from diverticulitis as well as ESBL Escherichia coli based on prior sensitivities.   Continue Zosyn.  Still mildly hypothermic with a temperature of 96.6 this morning.  Need to evaluate for alternative pathophysiology.  Active Problems:  Hypothermia  TSH checked 09/18/13 and was 6.090. Free T4 was 1.80 at that time. Recheck thyroid function.  Check cortisol.  Check MRI brain R/O hypothalamic or brain stem infarct.  Holding Aricept and Namenda in case these medications are contributory.  Stage III chronic kidney disease  Baseline creatinine around 1.1-1.2. Creatinine at usual baseline values.  Avoid nephrotoxins.  Candidal intertrigo  Has been on antifungal cream, but groin folds macerated with severe cutaneous candidiasis. Continue Diflucan.  Dementia / dysphagia   Discontinue Aricept and  Namenda in light of recurrent hypothermia.  Seen by speech therapist 10/01/13. Dysphagia 1 diet with nectar thickened liquids recommended.  DM (diabetes mellitus)   Hold Glucotrol. Continue SSI, insulin sensitive scale q. a.c./at bedtime. CBGs 112-146.  HTN (hypertension)   Blood pressure rising. Antihypertensives including hydrochlorothiazide, lisinopril, and metoprolol have been on hold.  Resume lisinopril.  Acute encephalopathy   Likely reflective of acute sepsis.  FTT (failure to thrive) in adult   Supportive care. The patient is a DO NOT RESUSCITATE.   DVT prophylaxis   Lovenox ordered.  Code Status: DNR  Family Communication: Daughter Elson Clan [phone]) called and updated by telephone 10/03/13.  Disposition Plan: SNF when stable.    IV Access:    Peripheral IV   Procedures and diagnostic studies:    Chest x-ray 09/29/13: Borderline cardiomegaly, left lung base atelectasis and low inspiratory effort.  CT of the head 09/29/13: Remote infarcts and small vessel disease type changes without CT evidence of large acute infarct. No intracranial hemorrhage. Atrophy without hydrocephalus. Polypoid opacity occasion left frontal sinus.  CT abdomen and pelvis 09/29/13: Mild changes of left colonic diverticulitis. No evidence of abscess.   Medical Consultants:    None.   Other Consultants:    Speech therapy: Dysphagia 1 diet with nectar thickened liquids recommended.  Physical therapy.  Anti-Infectives:    Zosyn 09/29/13 --->  Subjective:   Desiree Gonzalez remains lethargic and is not verbal today.  She remains on a bear hugger.  Objective:    Filed Vitals:   10/03/13 0300 10/03/13 0400 10/03/13 0500 10/03/13 0600  BP: 161/62 126/54 152/72 162/66  Pulse: 59 57 72   Temp: 97.3 F (36.3 C) 97 F (36.1 C) 96.6 F (35.9 C)  96.6 F (35.9 C)  TempSrc:      Resp: 12 13 16 16   Height:      Weight:  90.3 kg (199 lb 1.2 oz)    SpO2: 95% 93% 95% 95%     Intake/Output Summary (Last 24 hours) at 10/03/13 0725 Last data filed at 10/03/13 0600  Gross per 24 hour  Intake 3038.5 ml  Output    945 ml  Net 2093.5 ml    Exam: Gen: Lethargic, not verbally responsive Cardiovascular:  RRR, No M/R/G Respiratory:  Lungs CTAB Gastrointestinal:  Abdomen soft, NT/ND, + BS Extremities:  No C/E/C  Data Reviewed:    Labs: Basic Metabolic Panel:  Recent Labs Lab 09/29/13 1229 09/30/13 0339 10/01/13 0541 10/03/13 0341  NA 135* 137 139 138  K 4.4 4.6 4.1 3.8  CL 98 103 106 103  CO2 23 21 22 22   GLUCOSE 118* 126* 132* 126*  BUN 31* 26* 19 16  CREATININE 1.28* 1.39* 1.22* 1.13*  CALCIUM 9.6 8.6 8.6 8.8   GFR Estimated Creatinine Clearance: 39.7 ml/min (by C-G formula based on Cr of 1.13). Liver Function Tests:  Recent Labs Lab 09/29/13 1229  AST 17  ALT 22  ALKPHOS 106  BILITOT <0.2*  PROT 7.0  ALBUMIN 3.0*   CBC:  Recent Labs Lab 09/29/13 1229 09/30/13 0339 10/01/13 0541 10/03/13 0341  WBC 8.5 10.9* 8.6 7.2  NEUTROABS 6.3  --   --   --   HGB 12.2 11.0* 10.3* 10.1*  HCT 36.5 33.4* 31.3* 29.2*  MCV 85.5 86.3 84.8 84.6  PLT 232 210 228 193   CBG:  Recent Labs Lab 10/01/13 2102 10/02/13 1010 10/02/13 1333 10/02/13 1705 10/02/13 2140  GLUCAP 146* 127* 108* 112* 121*   Sepsis Labs:  Recent Labs Lab 09/29/13 1229 09/29/13 1252 09/30/13 0339 10/01/13 0541 10/03/13 0341  WBC 8.5  --  10.9* 8.6 7.2  LATICACIDVEN  --  1.51  --   --   --    Microbiology Recent Results (from the past 240 hour(s))  CULTURE, BLOOD (ROUTINE X 2)     Status: None   Collection Time    09/29/13 12:29 PM      Result Value Ref Range Status   Specimen Description BLOOD LEFT HAND   Final   Special Requests BOTTLES DRAWN AEROBIC ONLY 2CC   Final   Culture  Setup Time     Final   Value: 09/29/2013 14:07     Performed at [facility]   Culture     Final   Value:        BLOOD CULTURE RECEIVED NO GROWTH TO DATE CULTURE  WILL BE HELD FOR 5 DAYS BEFORE ISSUING A FINAL NEGATIVE REPORT     Performed at [facility]   Report Status PENDING   Incomplete  CULTURE, BLOOD (ROUTINE X 2)     Status: None   Collection Time    09/29/13 12:32 PM      Result Value Ref Range Status   Specimen Description BLOOD RIGHT ARM   Final   Special Requests BOTTLES DRAWN AEROBIC AND ANAEROBIC [facility]3CC EACH   Final   Culture  Setup Time     Final   Value: 09/29/2013 14:07     Performed at [facility]   Culture     Final   Value:        BLOOD CULTURE RECEIVED NO GROWTH TO DATE CULTURE WILL BE HELD FOR 5 DAYS  BEFORE ISSUING A FINAL NEGATIVE REPORT     Performed at [facility]   Report Status PENDING   Incomplete  URINE CULTURE     Status: None   Collection Time    09/29/13  1:39 PM      Result Value Ref Range Status   Specimen Description URINE, CATHETERIZED   Final   Special Requests NONE   Final   Culture  Setup Time     Final   Value: 09/29/2013 17:23     Performed at [facility]   Colony Count     Final   Value: NO GROWTH     Performed at [facility]   Culture     Final   Value: NO GROWTH     Performed at [facility]   Report Status 09/30/2013 FINAL   Final     Medications:   . antiseptic oral rinse  7 mL Mouth Rinse BID  . aspirin  81 mg Oral q morning - 10a  . enoxaparin (LOVENOX) injection  40 mg Subcutaneous Q24H  . fluconazole (DIFLUCAN) IV  100 mg Intravenous Q24H  . insulin aspart  0-5 Units Subcutaneous QHS  . insulin aspart  0-9 Units Subcutaneous TID WC  . metoprolol tartrate  25 mg Oral BID  . miconazole  1 application Topical BID  . piperacillin-tazobactam (ZOSYN)  IV  3.375 g Intravenous Q8H  . psyllium  1 packet Oral q morning - 10a   Continuous Infusions: . sodium chloride 10 mL (10/02/13 2031)    Time spent: 35 minutes, the patient is medically complex and requires high complexity decision making.   LOS: 4 days   Kc Sedlak  [facility] Pager [phone]. If unable to reach me by pager, please call my cell phone at [phone].  *Please refer to [email or web address], password [email or web address] to get updated schedule on who will round on this patient, as hospitalists switch teams weekly. If 7PM-7AM, please contact night-coverage at [email or web address], password [email or web address] for any overnight needs.  10/03/2013, 7:25 AM    **Disclaimer: This note was dictated with voice recognition software. Similar sounding words can inadvertently be transcribed and this note may contain transcription errors which may not have been corrected upon publication of note.**

## 2013-10-03 NOTE — Progress Notes (Signed)
CSW continuing to follow for disposition planning.  Pt admitted from [facility].  CSW reviewed chart and noted that MD and PT note indicates recommendation for SNF upon discharge.  CSW visited pt room. No family present at bedside. Per chart, pt oriented to person only.   CSW contacted pt daughter, Desiree Gonzalez via telephone. CSW introduced self and explained role. CSW discussed with pt daughter that pt may need higher level of care upon discharge. Pt daughter expressed that she is hopeful that pt will be able to return to [facility]. CSW discussed that CSW can discuss pt needs with [facility] and have facility to assess and determine if facility can meet pt needs, but it will also be beneficial to explore SNF options in order for pt daughter to have options available if [facility] does not feel that pt needs can be met at ALF level of care. Pt daughter expressed understanding and agreeable to [facility] search as well.  CSW contacted [facility] RN, Marguarite Arbour and discussed pt. Per [facility], facility is willing to review information to assess if pt able to return. CSW to fax pt clinicals for review.   CSW updated FL2 and initiated SNF search to [facility] in order to have SNF options available.   CSW to continue to follow.  Alison Murray, MSW, [place] Work [phone]

## 2013-10-04 ENCOUNTER — Inpatient Hospital Stay (HOSPITAL_COMMUNITY): Payer: Medicare Other

## 2013-10-04 LAB — CBC
HCT: 31.5 % — ABNORMAL LOW (ref 36.0–46.0)
HEMOGLOBIN: 10.8 g/dL — AB (ref 12.0–15.0)
MCH: 29.3 pg (ref 26.0–34.0)
MCHC: 34.3 g/dL (ref 30.0–36.0)
MCV: 85.4 fL (ref 78.0–100.0)
PLATELETS: 217 10*3/uL (ref 150–400)
RBC: 3.69 MIL/uL — AB (ref 3.87–5.11)
RDW: 14.3 % (ref 11.5–15.5)
WBC: 6.4 10*3/uL (ref 4.0–10.5)

## 2013-10-04 LAB — GLUCOSE, CAPILLARY
GLUCOSE-CAPILLARY: 104 mg/dL — AB (ref 70–99)
GLUCOSE-CAPILLARY: 114 mg/dL — AB (ref 70–99)
GLUCOSE-CAPILLARY: 245 mg/dL — AB (ref 70–99)
GLUCOSE-CAPILLARY: 150 mg/dL — AB (ref 70–99)
Glucose-Capillary: 124 mg/dL — ABNORMAL HIGH (ref 70–99)
Glucose-Capillary: 110 mg/dL — ABNORMAL HIGH (ref 70–99)

## 2013-10-04 LAB — BASIC METABOLIC PANEL
Anion gap: 11 (ref 5–15)
BUN: 19 mg/dL (ref 6–23)
CHLORIDE: 101 meq/L (ref 96–112)
CO2: 25 mEq/L (ref 19–32)
Calcium: 9.1 mg/dL (ref 8.4–10.5)
Creatinine, Ser: 1.22 mg/dL — ABNORMAL HIGH (ref 0.50–1.10)
GFR, EST AFRICAN AMERICAN: 44 mL/min — AB (ref >90)
GFR, EST NON AFRICAN AMERICAN: 38 mL/min — AB (ref >90)
Glucose, Bld: 128 mg/dL — ABNORMAL HIGH (ref 70–99)
Potassium: 3.7 mEq/L (ref 3.7–5.3)
SODIUM: 137 meq/L (ref 137–147)

## 2013-10-04 MED ORDER — HYDRALAZINE HCL 20 MG/ML IJ SOLN
5.0000 mg | Freq: Once | INTRAMUSCULAR | Status: AC
  Administered 2013-10-04: 5 mg via INTRAVENOUS
  Filled 2013-10-04: qty 1

## 2013-10-04 NOTE — Progress Notes (Signed)
CSW continuing to follow for disposition planning.  CSW met with pt daughter, Desiree Gonzalez at bedside.   CSW discussed that CSW is in communication with [place] unit at [facility] and facility wanted CSW to sent clinicals once MRI results available, but per RN pt had MRI today and results pending. Pt daughter expressed understanding and agreeable to pt ALF assessing to determine if they can continue to meet pt needs. Support provided as pt daughter discussed that she maintains hope that pt can return to [facility] unit at [place].  CSW provided SNF bed offers to pt daughter in order for pt daughter to review and have secondary options available in the event that pt ALF does not feel they can continue to meet pt needs.  CSW to fax pt clinicals to ALF once MRI results available.  CSW to follow up with pt daughter regarding disposition planning.  Alison Murray, MSW, [place] Work [phone]

## 2013-10-04 NOTE — Progress Notes (Signed)
Physical Therapy Treatment Patient Details Name: Desiree Apleystelle M Sheek MRN: [id number] DOB: 09/30/1923 Today's Date: 10/04/2013    History of Present Illness Desiree Gonzalez is a 80 y.o. female with a past medical history of dementia, currently nursing home resident who was found unresponsive and brought to the ER on 09/29/13. Pt found to be hypothermic.    PT Comments    Pt in bed with Bear Hug warmer responds to her name and making eye contact.  Assisted from supine to sit EOB + 2 total assist pt 0%.  Once upright pt was able to sit EOB x 8 min but required MAX assist to prevent posterior len.  Hand over hand asssit to take a couple sips of water.  That's when daughter arrived.  After sitting several min pt started to show signs of fatigue and increased posterior lean.  Assisted + 2 sisde by side HHA to stand.  Pt was able to stand and support her own body weight.  Assisted with 1/4 turn to recliner required repeat functional cueing for direction and hand over hand asssit to reach center of chair.  Also required assist to control descend.  Positioned in recliner upright with B LE elevated and reapplied Bear Hug warmer.   Hoyer pad placed in recliner as pt will need to be lifted to get back to bed.  Reported to RN.  At this point pt requires extensive care and assist.  Rec pt D/C to SNF vs her Memory Care ALF.  Follow Up Recommendations  SNF     Equipment Recommendations       Recommendations for Other Services       Precautions / Restrictions Precautions Precautions: Fall Restrictions Weight Bearing Restrictions: No    Mobility  Bed Mobility Overal bed mobility: Needs Assistance Bed Mobility: Supine to Sit     Supine to sit: Total assist;+2 for safety/equipment;+2 for physical assistance;HOB elevated     General bed mobility comments: pt alert to self with appropriate eye contact and few words.  Assisted pt from supine to EOB + 2 total assist pt 0%.  Once upright pt required MAX  assist to stad upright with tendency to lean backward.  Pt sat EOB x 8 min.    Transfers Overall transfer level: Needs assistance Equipment used: None Transfers: Sit to/from [employer];Stand Pivot Transfers Sit to Stand: +2 physical assistance;+2 safety/equipment;Max assist Stand pivot transfers: Max assist;+2 physical assistance;+2 safety/equipment       General transfer comment: + 2 side by side HHA sit to stand off elevated bed pt did support her own body weight.  Guided 1/4 turn to recliner with increased time repeat simple cueing for direction.    Ambulation/Gait                 Stairs            Wheelchair Mobility    Modified Rankin (Stroke Patients Only)       Balance                                    Cognition                            Exercises      General Comments        Pertinent Vitals/Pain     Home Living  Prior Function            PT Goals (current goals can now be found in the care plan section) Progress towards PT goals: Progressing toward goals    Frequency  Min 2X/week    PT Plan      Co-evaluation             End of Session Equipment Utilized During Treatment: Gait belt Activity Tolerance: Other (comment);Treatment limited secondary to medical complications (Comment) (hx Alzheimers)       Time: 1610-9604 PT Time Calculation (min): 39 min  Charges:  $Therapeutic Activity: 38-52 mins                    G Codes:      Desiree Gonzalez  PTA [facility]  Acute  Rehab Pager      [phone]

## 2013-10-04 NOTE — Progress Notes (Addendum)
Speech Language Pathology Treatment: Dysphagia  Patient Details Name: Desiree Gonzalez MRN: [id number] DOB: 06-04-1923 Today's Date: 10/04/2013 Time: [id number] SLP Time Calculation (min): 18 min  Assessment / Plan / Recommendation Clinical Impression  Pt seen with diet advancement (soft/puree/thin) boluses with good tolerance.  Mild delayed swallow initiation but no symptoms of airway compromise. Adequate but delayed mastication with solids noted without stasis.   SlP had pt self feed liquids to allow improved neuro input.    Nurse tech reports good tolerance of breakfast.  Pt alert but easily becomes sleepy during session.   Note pt for MRI of brain today.  Ct head negative for acute change on admit.   Recommend consider advancing diet to dys3/ground meats/thin with strict aspiration precautions. SLP to follow up for dysphagia management.    Aspiration risk will be chronic due to pt's weakness, dementia, previous CVA and reliance on others for feeding.    HPI HPI: 80 year old female admitted 09/29/13 due to unresponsiveness.  PMH significant for dementia, CVA, GERD.  BSE done with recommendation for puree/nectar diet.  Pt being followed for dysphagia management.  CXR 7/30 Borderline cardiomegaly, left lung base atelectasis and this low inspiratory exam.     Pertinent Vitals Afebrile, decreased  SLP Plan  Continue with current plan of care    Recommendations Diet recommendations: Dysphagia 3 (mechanical soft);Thin liquid Liquids provided via: Cup;Straw Medication Administration: Whole meds with puree (follow with liquids) Supervision: Staff to assist with self feeding Compensations: Slow rate;Small sips/bites Postural Changes and/or Swallow Maneuvers: Seated upright 90 degrees              Oral Care Recommendations: Oral care BID Follow up Recommendations: Skilled Nursing facility Plan: Continue with current plan of care    GO     Desiree KollerKimball, Desiree Febres Ann Ajiah Mcglinn, MS [facility]  SLP [phone]

## 2013-10-04 NOTE — Progress Notes (Addendum)
Patient ID: Desiree Gonzalez, female   DOB: 11-Sep-1923, 80 y.o.   MRN: [id number] [facility] PROGRESS NOTE  AOKI WEDEMEYER WUJ:[id number] DOB: Nov 04, 1923 DOA: 09/29/2013 PCP: Elie Confer, MD  Brief narrative: 80 y.o. female nursing home resident with a PMH of recent hospitalization 09/18/13-09/22/13 for treatment of ESBL Escherichia coli, history of dementia who at baseline is alert and conversant but confused. Pt presented to [facility] ED 09/29/2013 after he was found unresponsive by staff members on 09/29/13. Upon initial evaluation in the ED, the patient was hypothermic with a temperature of 94.5. CT scan of the abdomen showed diverticulitis. The patient showed signs of improvement by 10/01/2013, but then developed recurrent hyperthermia necessitating transfer back to the SDU for treatment with the bear hugger.   Assessment/Plan:    Principal Problem:  Sepsis secondary to Diverticulitis versus recurrent ESBL UTI / hypothermia  Patient recently hospitalized with ESBL Escherichia coli UTI/sepsis. Urinalysis clear, urine culture negative.  Mild diverticulitis noted on CT scan of the abdomen and pelvis.  Continue zosyn. Pt is also on diflucan for candidal intertrigo. Remains in SDU due to ongoing hypothermia. Active Problems:  Hypothermia / Acute metabolic encephalopathy / Dementia   TSH checked 09/18/13 and was 6.090 and on this admission still slightly up at 4.89 but improving. Cortisol WNL.  Free T4 and T3 WNL. Holding Aricept and Namenda until mental status improves. Stage III chronic kidney disease  Baseline creatinine around 1.1-1.2. Creatinine at usual baseline values.  Avoid nephrotoxins. Candidal intertrigo  Has been on antifungal cream, but groin folds macerated with severe cutaneous candidiasis. Continue Diflucan. Dysphagia  Per dietitian we will advance diet to dysphagia 3. DM (diabetes mellitus)  CBG's in past 24 hours: 109, 104, 114 HTN (hypertension)  Continue lisinopril  10 mg PO daily. Continue metoprolol 25 mg PO BID. FTT (failure to thrive) in adult  Supportive care.  DVT prophylaxis  Lovenox ordered.   Code Status: DNR  Family Communication: Daughter Elson Clan [phone]) called and updated by telephone 10/03/13.  Disposition Plan: SNF when stable.    IV Access:   Peripheral IV Procedures and diagnostic studies:   Chest x-ray 09/29/13: Borderline cardiomegaly, left lung base atelectasis and low inspiratory effort.  CT of the head 09/29/13: Remote infarcts and small vessel disease type changes without CT evidence of large acute infarct. No intracranial hemorrhage. Atrophy without hydrocephalus. Polypoid opacity occasion left frontal sinus.  CT abdomen and pelvis 09/29/13: Mild changes of left colonic diverticulitis. No evidence of abscess. Medical Consultants:   None. Other Consultants:   Speech therapy: Dysphagia 1 diet with nectar thickened liquids recommended.  Physical therapy. Anti-Infectives:   Zosyn 09/29/13 ---> Fluconazole 09/30/2013 -->   Orlena Garmon, MD  [facility] Pager [phone]  If 7PM-7AM, please contact night-coverage [email or web address] Password [email or web address] 10/04/2013, 11:19 AM   LOS: 5 days    HPI/Subjective: No acute overnight events.  Objective: Filed Vitals:   10/04/13 0700 10/04/13 0800 10/04/13 0900 10/04/13 0940  BP: 173/75 177/78  166/66  Pulse: 73 71 91 69  Temp: 96.1 F (35.6 C) 95.7 F (35.4 C) 95.5 F (35.3 C)   TempSrc:  Core (Comment)    Resp: 12 16 20    Height:      Weight:      SpO2: 100% 100% 97%     Intake/Output Summary (Last 24 hours) at 10/04/13 1119 Last data filed at 10/04/13 1028  Gross per 24 hour  Intake    620 ml  Output   1410  ml  Net   -790 ml    Exam:   General:  Pt is sleeping this am, no distress  Cardiovascular: Regular rate and rhythm, S1/S2 appreciated   Respiratory: Clear to auscultation bilaterally, no wheezing  Abdomen: Soft, non tender, non distended, bowel sounds  present  Extremities: No edema, pulses DP and PT palpable bilaterally  Neuro: Grossly nonfocal  Data Reviewed: Basic Metabolic Panel:  Recent Labs Lab 09/29/13 1229 09/30/13 0339 10/01/13 0541 10/03/13 0341 10/04/13 0345  NA 135* 137 139 138 137  K 4.4 4.6 4.1 3.8 3.7  CL 98 103 106 103 101  CO2 23 21 22 22 25   GLUCOSE 118* 126* 132* 126* 128*  BUN 31* 26* 19 16 19   CREATININE 1.28* 1.39* 1.22* 1.13* 1.22*  CALCIUM 9.6 8.6 8.6 8.8 9.1   Liver Function Tests:  Recent Labs Lab 09/29/13 1229  AST 17  ALT 22  ALKPHOS 106  BILITOT <0.2*  PROT 7.0  ALBUMIN 3.0*   No results found for this basename: LIPASE, AMYLASE,  in the last 168 hours No results found for this basename: AMMONIA,  in the last 168 hours CBC:  Recent Labs Lab 09/29/13 1229 09/30/13 0339 10/01/13 0541 10/03/13 0341 10/04/13 0345  WBC 8.5 10.9* 8.6 7.2 6.4  NEUTROABS 6.3  --   --   --   --   HGB 12.2 11.0* 10.3* 10.1* 10.8*  HCT 36.5 33.4* 31.3* 29.2* 31.5*  MCV 85.5 86.3 84.8 84.6 85.4  PLT 232 210 228 193 217   Cardiac Enzymes: No results found for this basename: CKTOTAL, CKMB, CKMBINDEX, TROPONINI,  in the last 168 hours BNP: No components found with this basename: POCBNP,  CBG:  Recent Labs Lab 10/03/13 0806 10/03/13 1302 10/03/13 1643 10/03/13 2154 10/04/13 0716  GLUCAP 105* 111* 109* 104* 114*    CULTURE, BLOOD (ROUTINE X 2)     Status: None   Collection Time    09/29/13 12:29 PM      Result Value Ref Range Status   Specimen Description BLOOD LEFT HAND   Final   Value:        BLOOD CULTURE RECEIVED NO GROWTH TO DATE      Performed at [facility]   Report Status PENDING   Incomplete  CULTURE, BLOOD (ROUTINE X 2)     Status: None   Collection Time    09/29/13 12:32 PM      Result Value Ref Range Status   Specimen Description BLOOD RIGHT ARM   Final   Value:        BLOOD CULTURE RECEIVED NO GROWTH TO DATE      Performed at [facility]   Report Status  PENDING   Incomplete  URINE CULTURE     Status: None   Collection Time    09/29/13  1:39 PM      Result Value Ref Range Status   Specimen Description URINE, CATHETERIZED   Final   Value: NO GROWTH     Performed at [facility]   Report Status 09/30/2013 FINAL   Final     Studies: No results found.  Scheduled Meds: . aspirin  81 mg Oral q morning - 10a  . enoxaparin (LOVENOX)   40 mg Subcutaneous Q24H  . fluconazole (DIFLUCAN)   100 mg Intravenous Q24H  . insulin aspart  0-5 Units Subcutaneous QHS  . insulin aspart  0-9 Units Subcutaneous TID WC  . lisinopril  10 mg Oral  q morning - 10a  . metoprolol tartrate  25 mg Oral BID  . miconazole  1 application Topical BID  . piperacillin-tazobactam   3.375 g Intravenous Q8H  . psyllium  1 packet Oral q morning - 10a   Continuous Infusions: . sodium chloride 10 mL/hr at 10/03/13 1600

## 2013-10-04 NOTE — Progress Notes (Addendum)
Clinical Social Work Department CLINICAL SOCIAL WORK PLACEMENT NOTE 10/04/2013  Patient:  Alveria ApleySMITH,Kataleya M  Account Number:  [id number] Admit date:  09/29/2013  Clinical Social Worker:  Jacelyn GripSUZANNA BYRD, LCSWA  Date/time:  10/04/2013 03:45 PM  Clinical Social Work is seeking post-discharge placement for this patient at the following level of care:   SKILLED NURSING   (*CSW will update this form in Epic as items are completed)   10/04/2013  Patient/family provided with [facility] of Clinical Social Work's list of facilities offering this level of care within the geographic area requested by the patient (or if unable, by the patient's family).  10/04/2013  Patient/family informed of their freedom to choose among providers that offer the needed level of care, that participate in Medicare, Medicaid or managed care program needed by the patient, have an available bed and are willing to accept the patient.  10/04/2013  Patient/family informed of [facility]' ownership interest in [facility], as well as of the fact that they are under no obligation to receive care at this facility.  PASARR submitted to EDS on 10/07/2013 PASARR number received on 10/07/2013  FL2 transmitted to all facilities in geographic area requested by pt/family on  10/04/2013 FL2 transmitted to all facilities within larger geographic area on   Patient informed that his/her managed care company has contracts with or will negotiate with  certain facilities, including the following:     Patient/family informed of bed offers received:  10/04/2013 [facility] Physician recommends and patient chooses bed at    [facility]  10/10/13 Patient to be transferred to facility by [facility] Patient and family notified of transfer on 10/10/13 Name of family member notified:  Letha-dtr via phone  The following physician request were entered in Epic:   Additional  Comments:   Loletta SpecterSuzanna Kidd, MSW, LCSW Clinical Social Work [phone]

## 2013-10-05 LAB — GLUCOSE, CAPILLARY
GLUCOSE-CAPILLARY: 118 mg/dL — AB (ref 70–99)
Glucose-Capillary: 213 mg/dL — ABNORMAL HIGH (ref 70–99)
Glucose-Capillary: 158 mg/dL — ABNORMAL HIGH (ref 70–99)
Glucose-Capillary: 185 mg/dL — ABNORMAL HIGH (ref 70–99)

## 2013-10-05 LAB — CULTURE, BLOOD (ROUTINE X 2)
CULTURE: NO GROWTH
Culture: NO GROWTH

## 2013-10-05 MED ORDER — HYDRALAZINE HCL 20 MG/ML IJ SOLN
10.0000 mg | Freq: Four times a day (QID) | INTRAMUSCULAR | Status: DC | PRN
  Administered 2013-10-05: 10 mg via INTRAVENOUS
  Filled 2013-10-05: qty 1

## 2013-10-05 MED ORDER — HYDRALAZINE HCL 10 MG PO TABS
10.0000 mg | ORAL_TABLET | Freq: Four times a day (QID) | ORAL | Status: DC
  Administered 2013-10-05 – 2013-10-06 (×3): 10 mg via ORAL
  Filled 2013-10-05 (×6): qty 1

## 2013-10-05 NOTE — Progress Notes (Signed)
CSW continuing to follow for disposition planning.  [facility] and left message with RN, Blake DivineShauna. Awaiting call back.  CS[facility] who stated that Blake DivineShauna was anticipating phone call, but currently in a meeting and requested CSW sent clinicals.  CS[facility] for facility to review to determine if they can continue to meet pt needs because pt daughter would prefer for pt to return there.   PT recommends SNF and pt daughter has been provided SNF bed offers.  CSW to follow up with pt daughter once [facility] provides feedback regarding if facility can meet pt needs in order to discuss with pt daughter.  CSW to continue to follow.  Loletta SpecterSuzanna Kidd, MSW, LCSW Clinical Social Work [phone]

## 2013-10-05 NOTE — Progress Notes (Signed)
ANTIBIOTIC CONSULT NOTE - Follow-up  Pharmacy Consult for Zosyn Indication: Recurrent UTI/E coli  No Known Allergies  Patient Measurements: Height: 5\' 7"  (170.2 cm) Weight: 203 lb 4.2 oz (92.2 kg) IBW/kg (Calculated) : 61.6  Vital Signs: Temp: 97.3 F (36.3 C) (08/05 0900) BP: 184/73 mmHg (08/05 0800) Pulse Rate: 58 (08/05 0900) Intake/Output from previous day: 08/04 0701 - 08/05 0700 In: 670 [P.O.:240; I.V.:230; IV Piggyback:200] Out: 1145 [Urine:1145] Intake/Output from this shift: Total I/O In: 30 [I.V.:30] Out: 550 [Urine:550]  Labs:  Recent Labs  10/03/13 0341 10/04/13 0345  WBC 7.2 6.4  HGB 10.1* 10.8*  PLT 193 217  CREATININE 1.13* 1.22*   Estimated Creatinine Clearance: 37.1 ml/min (by C-G formula based on Cr of 1.22). No results found for this basename: VANCOTROUGH, Leodis Binet, VANCORANDOM, GENTTROUGH, GENTPEAK, GENTRANDOM, TOBRATROUGH, TOBRAPEAK, TOBRARND, AMIKACINPEAK, AMIKACINTROU, AMIKACIN,  in the last 72 hours   Microbiology: Recent Results (from the past 720 hour(s))  URINE CULTURE     Status: None   Collection Time    09/18/13  8:29 AM      Result Value Ref Range Status   Specimen Description URINE, CATHETERIZED   Final   Special Requests NONE   Final   Culture  Setup Time     Final   Value: 09/18/2013 19:51     Performed at [employer] Count     Final   Value: >=100,000 COLONIES/ML     Performed at [facility]   Culture     Final   Value: ESCHERICHIA COLI     Performed at [facility]   Report Status 09/21/2013 FINAL   Final   Organism ID, Bacteria ESCHERICHIA COLI   Final  CULTURE, BLOOD (ROUTINE X 2)     Status: None   Collection Time    09/18/13  9:04 AM      Result Value Ref Range Status   Specimen Description BLOOD LEFT HAND   Final   Special Requests BOTTLES DRAWN AEROBIC AND ANAEROBIC 1CC   Final   Culture  Setup Time     Final   Value: 09/18/2013 18:06     Performed at [facility]    Culture     Final   Value: NO GROWTH 5 DAYS     Performed at [facility]   Report Status 09/24/2013 FINAL   Final  CULTURE, BLOOD (ROUTINE X 2)     Status: None   Collection Time    09/18/13  9:34 AM      Result Value Ref Range Status   Specimen Description BLOOD RIGHT ARM   Final   Special Requests BOTTLES DRAWN AEROBIC AND ANAEROBIC 5CC   Final   Culture  Setup Time     Final   Value: 09/18/2013 18:07     Performed at [facility]   Culture     Final   Value: NO GROWTH 5 DAYS     Performed at [facility]   Report Status 09/24/2013 FINAL   Final  URINE CULTURE     Status: None   Collection Time    09/18/13 12:14 PM      Result Value Ref Range Status   Specimen Description URINE, CATHETERIZED   Final   Special Requests NONE   Final   Culture  Setup Time     Final   Value: 09/18/2013 19:51     Performed at [facility]  Colony Count     Final   Value: 25,000 COLONIES/ML     [facility]   Culture     Final   Value: ESCHERICHIA COLI     [facility]formed at [facility]   Report Status 09/21/2013 FINAL   Final   Organism ID, Bacteria ESCHERICHIA COLI   Final  MRSA PCR SCREENING     Status: None   Collection Time    09/18/13 12:17 PM      Result Value Ref Range Status   MRSA by PCR NEGATIVE  NEGATIVE Final   Comment:            The GeneXpert MRSA Assay (FDA     approved for NASAL specimens     only), is one component of a     comprehensive MRSA colonization     surveillance program. It is not     intended to diagnose MRSA     infection nor to guide or     monitor treatment for     MRSA infections.  CULTURE, BLOOD (ROUTINE X 2)     Status: None   Collection Time    09/18/13  3:54 PM      Result Value Ref Range Status   Specimen Description BLOOD RIGHT HAND   Final   Special Requests BOTTLES DRAWN AEROBIC AND ANAEROBIC 10 CC   Final   Culture  Setup Time     Final   Value: 09/19/2013 08:44     Performed at  [facility]   Culture     Final   Value: NO GROWTH 5 DAYS     Performed at [facility]   Report Status 09/25/2013 FINAL   Final  CULTURE, BLOOD (ROUTINE X 2)     Status: None   Collection Time    09/18/13  4:13 PM      Result Value Ref Range Status   Specimen Description BLOOD LEFT HAND   Final   Special Requests BOTTLES DRAWN AEROBIC AND ANAEROBIC 10CC   Final   Culture  Setup Time     Final   Value: 09/19/2013 08:45     Performed at [facility]   Culture     Final   Value: NO GROWTH 5 DAYS     Performed at [facility]   Report Status 09/25/2013 FINAL   Final  CULTURE, BLOOD (ROUTINE X 2)     Status: None   Collection Time    09/29/13 12:29 PM      Result Value Ref Range Status   Specimen Description BLOOD LEFT HAND   Final   Special Requests BOTTLES DRAWN AEROBIC ONLY 2CC   Final   Culture  Setup Time     Final   Value: 09/29/2013 14:07     [facility]formed at [facility]   Culture     Final   Value: NO GROWTH 5 DAYS     Performed at [facility]   Report Status 10/05/2013 FINAL   Final  CULTURE, BLOOD (ROUTINE X 2)     Status: None   Collection Time    09/29/13 12:32 PM      Result Value Ref Range Status   Specimen Description BLOOD RIGHT ARM   Final   Special Requests BOTTLES DRAWN AEROBIC AND ANAEROBIC [facility] EACH   Final   Culture  Setup Time     Final   Value: 09/29/2013 14:07  Performed at [employer]     Final   Value: NO GROWTH 5 DAYS     Performed at [facility]   Report Status 10/05/2013 FINAL   Final  URINE CULTURE     Status: None   Collection Time    09/29/13  1:39 PM      Result Value Ref Range Status   Specimen Description URINE, CATHETERIZED   Final   Special Requests NONE   Final   Culture  Setup Time     Final   Value: 09/29/2013 17:23     Performed at [facility]   Colony Count     Final   Value: NO GROWTH     Performed at [facility]   Culture      Final   Value: NO GROWTH     Performed at [facility]   Report Status 09/30/2013 FINAL   Final   Anti-infectives   Start     Dose/Rate Route Frequency Ordered Stop   09/30/13 1000  fluconazole (DIFLUCAN) IVPB 100 mg     100 mg 50 mL/hr over 60 Minutes Intravenous Every 24 hours 09/30/13 0844     09/29/13 2000  ciprofloxacin (CIPRO) IVPB 400 mg  Status:  Discontinued     400 mg 200 mL/hr over 60 Minutes Intravenous Every 12 hours 09/29/13 1540 09/29/13 1550   09/29/13 2000  metroNIDAZOLE (FLAGYL) IVPB 500 mg  Status:  Discontinued     500 mg 100 mL/hr over 60 Minutes Intravenous Every 8 hours 09/29/13 1540 09/29/13 1550   09/29/13 2000  piperacillin-tazobactam (ZOSYN) IVPB 3.375 g     3.375 g 12.5 mL/hr over 240 Minutes Intravenous Every 8 hours 09/29/13 1610     09/29/13 1245  vancomycin (VANCOCIN) IVPB 1000 mg/200 mL premix     1,000 mg 200 mL/hr over 60 Minutes Intravenous  Once 09/29/13 1234 09/29/13 1420   09/29/13 1245  piperacillin-tazobactam (ZOSYN) IVPB 3.375 g     3.375 g 100 mL/hr over 30 Minutes Intravenous  Once 09/29/13 1234 09/29/13 1316     Assessment: 89 yoF unresponsive at SNF, to ED 7/30, admit for IV abx-likely urosepsis. Last admit 7/19-7/23 for urosepsis 2/2 E coli in urine treated with Zosyn, continue Cipro at [facility].   7/30 >> Zosyn >> 7/30 >> Vanc x1  7/31 >> Fluconazole >>  Tmax: HYPOthermic at times WBCs: WNL Renal: fluctuating but near baseline (1.1-1.2), Cl ~ 36CG / 47N  12/11/12 urine: E coli ESBL - ordered Septra 7/19 urine (@08 :29): >100K E coli NOT ESBL - treated w/Zosyn, then to SNF on Cipro 7/29 urine (@12 :14): 25K E. Coli NOT ESBL 7/30 blood: NGTD 7/30 urine: NGF  Goal of Therapy:  Antibiotic/dose/schedule appropriate for renal function, pathogen, disease state  Plan:  Day #7 zosyn, D#5 fluconazole  Continue Zosyn 3.375g IV q8h (infuse over 4 hours)  If concerned about ESBL then antibiotics should be changed to imipenem or  PO fosfomycin  ? Length of therapy  Juliette Alcide, PharmD, BCPS.   Pager: [phone] 10/05/2013 10:58 AM

## 2013-10-05 NOTE — Progress Notes (Signed)
Speech Language Pathology Treatment: Dysphagia  Patient Details Name: Desiree Gonzalez MRN: [id number] DOB: 06/02/1923 Today's Date: 10/05/2013 Time: [id number] SLP Time Calculation (min): 19 min  Assessment / Plan / Recommendation Clinical Impression  Pt today continues with improved swallow ability - likely due to improved mental status.  RN reports pt had a difficult time with the eggs during breakfast and continued coughing noted - adequate tolerance of oatmeal reported.   Pt tolerated pm meal 8/4 per RN report.  SLP wrote order for ground meats/extra gravy/sauce to be sent with meals to ease pt's oral transiting/swallowing.    Administered moist cracker, applesauce and water - marginally delayed oral transiting/swallow noted but excellent tolerance of solids.  Cough x1 noted of approximately 8 boluses of thin water with multiple swallows apparent (*suspect piecemealing)  Recommend to modify to liquids via tsp or cup if indicated.    SLP to follow up once more to assure tolerance of po diet, please have pt feed self if able for neurological input (with assistance).     HPI HPI: 80 year old female admitted 09/29/13 due to unresponsiveness.  PMH significant for dementia, CVA, GERD.  BSE done with recommendation for puree/nectar diet.  Pt being followed for dysphagia management.  CXR 7/30 Borderline cardiomegaly, left lung base atelectasis and this low inspiratory exam.  MRI yesterday showed wallerian degeneration in right brain stem, sub acute left basal ganglia cva, evolution of 2011 large right basal ganglia cva.     Pertinent Vitals Afebrile, intake 50%, lung sounds decreased  SLP Plan  Continue with current plan of care    Recommendations Diet recommendations: Dysphagia 3 (mechanical soft);Thin liquid Liquids provided via: Cup;Straw Medication Administration: Whole meds with puree (follow with liquids) Supervision: Staff to assist with self feeding Compensations: Slow rate;Small sips/bites  (use applesauce as needed to faciliate oral clearance) Postural Changes and/or Swallow Maneuvers: Seated upright 90 degrees;Upright 30-60 min after meal              Oral Care Recommendations: Oral care BID Follow up Recommendations: Skilled Nursing facility Plan: Continue with current plan of care    GO     Mills KollerKimball, Felesha Moncrieffe Ann Niza Soderholm, MS [facility] SLP [phone]

## 2013-10-05 NOTE — Progress Notes (Addendum)
Patient ID: Desiree Gonzalez, female   DOB: 1923/08/25, 80 y.o.   MRN: [id number] [facility] PROGRESS NOTE  ARASELI SHERRY WUJ:[id number] DOB: 24-Apr-1923 DOA: 09/29/2013 PCP: Elie Confer, MD  Brief narrative: 80 y.o. female nursing home resident with a PMH of recent hospitalization 09/18/13-09/22/13 for treatment of ESBL Escherichia coli, history of dementia who at baseline is alert and conversant but confused. Pt presented to [facility] ED 09/29/2013 after he was found unresponsive by staff members on 09/29/13. Upon initial evaluation in the ED, the patient was hypothermic with a temperature of 94.5 F. CT scan of the abdomen showed diverticulitis. The patient showed signs of improvement by 10/01/2013, but then developed recurrent hyperthermia necessitating transfer back to the SDU for treatment with the bear hugger.   Assessment/Plan:    Principal Problem:  Sepsis secondary to Diverticulitis versus recurrent ESBL UTI / hypothermia  Patient recently hospitalized with ESBL Escherichia coli UTI/sepsis. Urinalysis clear, urine culture negative.  Mild diverticulitis noted on CT scan of the abdomen and pelvis.  Continue zosyn for 3 more days. Pt is also on diflucan for candidal intertrigo.  Remains in SDU due to hypothermia in past 24 hours and accelerated hypertension. Active Problems:  Hypothermia / Acute metabolic encephalopathy / Dementia  TSH checked 09/18/13 and was 6.090 and on this admission still slightly up at 4.89 but improving. Cortisol WNL.  Free T4 and T3 WNL.  Holding Aricept and Namenda until mental status improves.No significant changes in mental status in past 24 hours.  Stage III chronic kidney disease  Baseline creatinine around 1.1-1.2. Creatinine at usual baseline values.  Avoid nephrotoxins (lisinopril being one of them so we stopped it today 10/05/2013). Candidal intertrigo  Has been on antifungal cream, but groin folds macerated with severe cutaneous candidiasis. Continue  Diflucan. Dysphagia  Per dietitian we will advance diet to dysphagia 3. DM (diabetes mellitus)  CBG's in past 24 hours: 109, 104, 114 Accelerated hypertension  Continue metoprolol 25 mg PO BID. Stop lisinopril due to renal insufficiency. Added hydralazine 10 mg IV every 6 hours in addition to hydralazine when necessary for blood pressure control. FTT (failure to thrive) in adult  Supportive care.  DVT prophylaxis  Lovenox ordered.   Code Status: DNR  Family Communication: Daughter Desiree Gonzalez [phone]); family not at the bedside this morning.  Disposition Plan: SNF when stable.   IV Access:   Peripheral IV Procedures and diagnostic studies:   Chest x-ray 09/29/13: Borderline cardiomegaly, left lung base atelectasis and low inspiratory effort.  CT of the head 09/29/13: Remote infarcts and small vessel disease type changes without CT evidence of large acute infarct. No intracranial hemorrhage. Atrophy without hydrocephalus. Polypoid opacity occasion left frontal sinus.  CT abdomen and pelvis 09/29/13: Mild changes of left colonic diverticulitis. No evidence of abscess. Medical Consultants:   None. Other Consultants:   Speech therapy: Dysphagia 1 diet with nectar thickened liquids recommended.  Physical therapy. Anti-Infectives:   Zosyn 09/29/13 --->  Fluconazole 09/30/2013 -->   Oceane Fosse, MD  [facility] Pager [phone]  If 7PM-7AM, please contact night-coverage [email or web address] Password [facility] 10/05/2013, 9:38 AM   LOS: 6 days    HPI/Subjective: No acute overnight events.  Objective: Filed Vitals:   10/05/13 0500 10/05/13 0600 10/05/13 0800 10/05/13 0900  BP: 152/67 171/65 184/73   Pulse: 79 68 75 58  Temp: 97.3 F (36.3 C) 97 F (36.1 C) 97.2 F (36.2 C) 97.3 F (36.3 C)  TempSrc:      Resp: 18 16  14 10  Height:      Weight:      SpO2: 87% 93% 93% 91%    Intake/Output Summary (Last 24 hours) at 10/05/13 1610 Last data filed at 10/05/13 0900  Gross per 24  hour  Intake    620 ml  Output   1495 ml  Net   -875 ml    Exam:  General: Pt is not in acute distress Cardiovascular: Regular rate and rhythm, S1/S2 appreciated  Respiratory: Bilateral air entry, no wheezing  Abdomen: Soft, non tender, bowel sounds present  Extremities: No edema, pulses DP and PT palpable bilaterally  Neuro: Grossly nonfocal  Data Reviewed: Basic Metabolic Panel:  Recent Labs Lab 09/29/13 1229 09/30/13 0339 10/01/13 0541 10/03/13 0341 10/04/13 0345  NA 135* 137 139 138 137  K 4.4 4.6 4.1 3.8 3.7  CL 98 103 106 103 101  CO2 23 21 22 22 25   GLUCOSE 118* 126* 132* 126* 128*  BUN 31* 26* 19 16 19   CREATININE 1.28* 1.39* 1.22* 1.13* 1.22*  CALCIUM 9.6 8.6 8.6 8.8 9.1   Liver Function Tests:  Recent Labs Lab 09/29/13 1229  AST 17  ALT 22  ALKPHOS 106  BILITOT <0.2*  PROT 7.0  ALBUMIN 3.0*   No results found for this basename: LIPASE, AMYLASE,  in the last 168 hours No results found for this basename: AMMONIA,  in the last 168 hours CBC:  Recent Labs Lab 09/29/13 1229 09/30/13 0339 10/01/13 0541 10/03/13 0341 10/04/13 0345  WBC 8.5 10.9* 8.6 7.2 6.4  NEUTROABS 6.3  --   --   --   --   HGB 12.2 11.0* 10.3* 10.1* 10.8*  HCT 36.5 33.4* 31.3* 29.2* 31.5*  MCV 85.5 86.3 84.8 84.6 85.4  PLT 232 210 228 193 217   Cardiac Enzymes: No results found for this basename: CKTOTAL, CKMB, CKMBINDEX, TROPONINI,  in the last 168 hours BNP: No components found with this basename: POCBNP,  CBG:  Recent Labs Lab 10/04/13 0716 10/04/13 1223 10/04/13 1608 10/04/13 1740 10/04/13 2312  GLUCAP 114* 245* 150* 124* 110*    CULTURE, BLOOD (ROUTINE X 2)     Status: None   Collection Time    09/29/13 12:29 PM      Result Value Ref Range Status   Specimen Description BLOOD LEFT HAND   Final   Value: NO GROWTH 5 DAYS     Performed at [facility]   Report Status 10/05/2013 FINAL   Final  CULTURE, BLOOD (ROUTINE X 2)     Status: None    Collection Time    09/29/13 12:32 PM      Result Value Ref Range Status   Specimen Description BLOOD RIGHT ARM   Final   Value: NO GROWTH 5 DAYS     Performed at [facility]   Report Status 10/05/2013 FINAL   Final  URINE CULTURE     Status: None   Collection Time    09/29/13  1:39 PM      Result Value Ref Range Status   Specimen Description URINE, CATHETERIZED   Final   Value: NO GROWTH     Performed at [facility]   Report Status 09/30/2013 FINAL   Final     Scheduled Meds: . aspirin  81 mg Oral q morning - 10a  . enoxaparin (LOVENOX) injection  40 mg Subcutaneous Q24H  . fluconazole (DIFLUCAN)   100 mg Intravenous Q24H  . insulin aspart  0-5 Units Subcutaneous QHS  . insulin aspart  0-9 Units Subcutaneous TID WC  . lisinopril  10 mg Oral q morning - 10a  . metoprolol tartrate  25 mg Oral BID  . miconazole  1 application Topical BID  . piperacillin-tazobactam   3.375 g Intravenous Q8H  . psyllium  1 packet Oral q morning - 10a   Continuous Infusions: . sodium chloride 10 mL/hr at 10/05/13 0900

## 2013-10-06 LAB — GLUCOSE, CAPILLARY
GLUCOSE-CAPILLARY: 122 mg/dL — AB (ref 70–99)
Glucose-Capillary: 179 mg/dL — ABNORMAL HIGH (ref 70–99)
Glucose-Capillary: 139 mg/dL — ABNORMAL HIGH (ref 70–99)
Glucose-Capillary: 120 mg/dL — ABNORMAL HIGH (ref 70–99)

## 2013-10-06 LAB — BASIC METABOLIC PANEL
Anion gap: 12 (ref 5–15)
BUN: 19 mg/dL (ref 6–23)
CO2: 23 mEq/L (ref 19–32)
Calcium: 9.1 mg/dL (ref 8.4–10.5)
Chloride: 100 mEq/L (ref 96–112)
Creatinine, Ser: 1.12 mg/dL — ABNORMAL HIGH (ref 0.50–1.10)
GFR calc Af Amer: 49 mL/min — ABNORMAL LOW (ref >90)
GFR calc non Af Amer: 43 mL/min — ABNORMAL LOW (ref >90)
GLUCOSE: 120 mg/dL — AB (ref 70–99)
POTASSIUM: 4.1 meq/L (ref 3.7–5.3)
Sodium: 135 mEq/L — ABNORMAL LOW (ref 137–147)

## 2013-10-06 MED ORDER — STROKE: EARLY STAGES OF RECOVERY BOOK
Freq: Once | Status: AC
  Administered 2013-10-06: 17:00:00
  Filled 2013-10-06: qty 1

## 2013-10-06 MED ORDER — HYDRALAZINE HCL 25 MG PO TABS
25.0000 mg | ORAL_TABLET | Freq: Four times a day (QID) | ORAL | Status: DC
  Administered 2013-10-06 – 2013-10-10 (×15): 25 mg via ORAL
  Filled 2013-10-06 (×20): qty 1

## 2013-10-06 NOTE — Progress Notes (Signed)
CSW assisting with d/c planning. Pt to transfer to telemetry today.  Blake DivineShauna[facility], will screen pt on FRI to determine if pt's care can be managed at the ALF level. Daughter would like pt to return to memory care, if possible. Spoke with daughter this am. She will consider [facility] if SNF placement is needed. [facility] has been contacted and bed availability has been confirmed. CSW will continue to follow to assist with d/c planning needs.  Cori RazorJamie Shatasia Cutshaw LCSW [phone]

## 2013-10-06 NOTE — Progress Notes (Addendum)
INITIAL NUTRITION ASSESSMENT  DOCUMENTATION CODES Per approved criteria  -Obesity Unspecified   INTERVENTION: - Diet advancement per MD/SLP - RD to continue to monitor   NUTRITION DIAGNOSIS: Inadequate oral intake related to inability to eat as evidenced by NPO.   Goal: Advance diet as tolerated to diabetic diet  Monitor:  Weights, labs, diet advancement  Reason for Assessment: Malnutrition screening tool   80 y.o. female  Admitting Dx: Diverticulitis  ASSESSMENT: Admitted due to unresponsiveness. PMH significant for dementia, CVA, DM, GERD.   - Seen by SLP 8/1 for bedside swallow evaluation and was noted to be at moderate aspiration risk with recommendations for dysphagia 1/nectar thick  - Seen by SLP again 8/4 and 8/5 with recommendations for dysphagia 3/thin liquid - Currently NPO - PO intake yesterday documented as 100%  - Attempted to meet with pt, however pt alone in room sound asleep    Height: Ht Readings from Last 1 Encounters:  09/29/13 5\' 7"  (1.702 m)    Weight: Wt Readings from Last 1 Encounters:  10/06/13 201 lb 4.5 oz (91.3 kg)    Ideal Body Weight: 135 lbs   % Ideal Body Weight: 149%  Wt Readings from Last 10 Encounters:  10/06/13 201 lb 4.5 oz (91.3 kg)  09/19/13 205 lb 7.5 oz (93.2 kg)  02/11/12 190 lb 0.6 oz (86.2 kg)    Usual Body Weight: 205 lbs   % Usual Body Weight: 98%  BMI:  Body mass index is 31.52 kg/(m^2). Class I obesity   Estimated Nutritional Needs: Kcal: 1600-1800 Protein: 65-90g Fluid: per MD  Skin: +1 generalized edema  Diet Order: NPO  EDUCATION NEEDS: -No education needs identified at this time   Intake/Output Summary (Last 24 hours) at 10/06/13 1551 Last data filed at 10/06/13 1200  Gross per 24 hour  Intake  992.5 ml  Output   2030 ml  Net -1037.5 ml    Last BM: 8/5  Labs:   Recent Labs Lab 10/03/13 0341 10/04/13 0345 10/06/13 0535  NA 138 137 135*  K 3.8 3.7 4.1  CL 103 101 100  CO2  22 25 23   BUN 16 19 19   CREATININE 1.13* 1.22* 1.12*  CALCIUM 8.8 9.1 9.1  GLUCOSE 126* 128* 120*    CBG (last 3)   Recent Labs  10/05/13 2109 10/06/13 0756 10/06/13 1220  GLUCAP 185* 122* 179*    Scheduled Meds: .  stroke: mapping our early stages of recovery book   Does not apply Once  . antiseptic oral rinse  7 mL Mouth Rinse BID  . aspirin  81 mg Oral q morning - 10a  . enoxaparin (LOVENOX) injection  40 mg Subcutaneous Q24H  . fluconazole (DIFLUCAN) IV  100 mg Intravenous Q24H  . hydrALAZINE  25 mg Oral 4 times per day  . insulin aspart  0-5 Units Subcutaneous QHS  . insulin aspart  0-9 Units Subcutaneous TID WC  . metoprolol tartrate  25 mg Oral BID  . miconazole  1 application Topical BID  . piperacillin-tazobactam (ZOSYN)  IV  3.375 g Intravenous Q8H  . psyllium  1 packet Oral q morning - 10a    Continuous Infusions: . sodium chloride 10 mL/hr at 10/05/13 1800    Past Medical History  Diagnosis Date  . CVA (cerebral infarction)   . Alzheimer's dementia   . GERD (gastroesophageal reflux disease)   . Hypertension   . Osteopenia   . Diabetes mellitus without complication   . Stage III  chronic kidney disease 10/02/2013    History reviewed. No pertinent past surgical history.  Charlott Rakes MS, RD, LDN [phone] Pager [phone] Weekend/After Hours Pager

## 2013-10-06 NOTE — Progress Notes (Addendum)
Patient ID: Desiree Gonzalez, female   DOB: 03-20-1923, 80 y.o.   MRN: [id number] [facility] PROGRESS NOTE  Desiree Gonzalez WUJ:[id number] DOB: 06-10-1923 DOA: 09/29/2013 PCP: Elie Confer, MD  Brief narrative: 80 y.o. female nursing home resident with a PMH of recent hospitalization 09/18/13-09/22/13 for treatment of ESBL Escherichia coli, history of dementia who at baseline is alert and conversant but confused. Pt presented to [facility] ED 09/29/2013 after he was found unresponsive by staff members on 09/29/13. Upon initial evaluation in the ED, the patient was hypothermic with a temperature of 94.5 F. CT scan of the abdomen showed diverticulitis. The patient showed signs of improvement by 10/01/2013, but then developed recurrent hyperthermia necessitating transfer back to the SDU for treatment with the bear hugger. Hospital course complicated due to prolonged encephalopathy. MRI brain showed subacute lacunar infarct in basal ganglia.  Assessment/Plan:   Principal Problem:  Sepsis secondary to Diverticulitis versus recurrent ESBL UTI / hypothermia  Patient recently hospitalized with ESBL Escherichia coli UTI/sepsis. Urinalysis clear, urine culture negative.  Mild diverticulitis noted on CT scan of the abdomen and pelvis.  Continue zosyn for 2 more days. Pt is also on diflucan for candidal intertrigo.  Medically stable for transfer to tele floor today. Active Problems:  Acute metabolic encephalopathy / Dementia / Subacute lacunar infarct Subacute infarct in basal ganglia. Pt is already on aspirin. Transfer to tele today. PT eval once pt able to participate. Stroke order set, focused order set in place.  TSH checked 09/18/13 and was 6.090 and on this admission still slightly up at 4.89 but improving. Cortisol WNL. Free T4 and T3 WNL.  Holding Aricept and Namenda until mental status improves.  Stage III chronic kidney disease  Baseline creatinine around 1.1-1.2. Creatinine at usual baseline values.   Avoid nephrotoxins (lisinopril being one of them so we stopped it 10/05/2013). Candidal intertrigo  Has been on antifungal cream, but groin folds macerated with severe cutaneous candidiasis. Continue Diflucan. Dysphagia  Per dietitian diet advanced to dysphagia 3. DM (diabetes mellitus)  CBG's in past 24 hours: 185, 122, 179 Continue SSI Accelerated hypertension  Continue metoprolol 25 mg PO BID. Stopped lisinopril due to renal insufficiency. Continue hydralazine but increase to 25 mg every 6 hours in addition to hydralazine when necessary for better blood pressure control. FTT (failure to thrive) in adult  Supportive care.  DVT prophylaxis  Lovenox ordered.   Code Status: DNR  Family Communication: Daughter Elson Clan [phone]); family not at the bedside this morning.  Disposition Plan: SNF when stable. Transfer to telemetry today (order in place)   IV Access:   Peripheral IV Procedures and diagnostic studies:   Chest x-ray 09/29/13: Borderline cardiomegaly, left lung base atelectasis and low inspiratory effort.  CT of the head 09/29/13: Remote infarcts and small vessel disease type changes without CT evidence of large acute infarct. No intracranial hemorrhage. Atrophy without hydrocephalus. Polypoid opacity occasion left frontal sinus.  CT abdomen and pelvis 09/29/13: Mild changes of left colonic diverticulitis. No evidence of abscess. MRI brain 8//06/2013 - subacute lacunar infarct in left basal ganglia  Medical Consultants:   None. Other Consultants:   Speech therapy: Dysphagia 3 diet  Physical therapy. Anti-Infectives:   Zosyn 09/29/13 --->  Fluconazole 09/30/2013 -->    Timotheus Salm, MD  [facility] Pager [phone]  If 7PM-7AM, please contact night-coverage [email or web address] Password [email or web address] 10/06/2013, 7:16 AM   LOS: 7 days    HPI/Subjective: No acute overnight events.  Objective: Filed Vitals:   10/06/13  0300 10/06/13 0400 10/06/13 0500 10/06/13 0501  BP:  117/36   117/36  Pulse: 83 77 86   Temp: 99.7 F (37.6 C) 99.3 F (37.4 C) 99.1 F (37.3 C)   TempSrc:      Resp: 24 19 23    Height:      Weight:      SpO2: 93% 95% 96%     Intake/Output Summary (Last 24 hours) at 10/06/13 0716 Last data filed at 10/06/13 0500  Gross per 24 hour  Intake 1032.5 ml  Output   2130 ml  Net -1097.5 ml    Exam:   General:  Pt is not in distress  Cardiovascular: Regular rate and rhythm, S1/S2 appreciated   Respiratory: diminished breath sounds, no wheezing   Abdomen: Soft, non tender, non distended, bowel sounds present  Extremities: Pulses DP and PT palpable bilaterally  Neuro: Grossly nonfocal  Data Reviewed: Basic Metabolic Panel:  Recent Labs Lab 09/30/13 0339 10/01/13 0541 10/03/13 0341 10/04/13 0345 10/06/13 0535  NA 137 139 138 137 135*  K 4.6 4.1 3.8 3.7 4.1  CL 103 106 103 101 100  CO2 21 22 22 25 23   GLUCOSE 126* 132* 126* 128* 120*  BUN 26* 19 16 19 19   CREATININE 1.39* 1.22* 1.13* 1.22* 1.12*  CALCIUM 8.6 8.6 8.8 9.1 9.1   Liver Function Tests:  Recent Labs Lab 09/29/13 1229  AST 17  ALT 22  ALKPHOS 106  BILITOT <0.2*  PROT 7.0  ALBUMIN 3.0*   No results found for this basename: LIPASE, AMYLASE,  in the last 168 hours No results found for this basename: AMMONIA,  in the last 168 hours CBC:  Recent Labs Lab 09/29/13 1229 09/30/13 0339 10/01/13 0541 10/03/13 0341 10/04/13 0345  WBC 8.5 10.9* 8.6 7.2 6.4  NEUTROABS 6.3  --   --   --   --   HGB 12.2 11.0* 10.3* 10.1* 10.8*  HCT 36.5 33.4* 31.3* 29.2* 31.5*  MCV 85.5 86.3 84.8 84.6 85.4  PLT 232 210 228 193 217   Cardiac Enzymes: No results found for this basename: CKTOTAL, CKMB, CKMBINDEX, TROPONINI,  in the last 168 hours BNP: No components found with this basename: POCBNP,  CBG:  Recent Labs Lab 10/04/13 2312 10/05/13 0815 10/05/13 1154 10/05/13 1700 10/05/13 2109  GLUCAP 110* 118* 213* 158* 185*    Recent Results (from the past 240  hour(s))  CULTURE, BLOOD (ROUTINE X 2)     Status: None   Collection Time    09/29/13 12:29 PM      Result Value Ref Range Status   Specimen Description BLOOD LEFT HAND   Final   Special Requests BOTTLES DRAWN AEROBIC ONLY 2CC   Final   Culture  Setup Time     Final   Value: 09/29/2013 14:07     Performed at [facility]   Culture     Final   Value: NO GROWTH 5 DAYS     Performed at [facility]   Report Status 10/05/2013 FINAL   Final  CULTURE, BLOOD (ROUTINE X 2)     Status: None   Collection Time    09/29/13 12:32 PM      Result Value Ref Range Status   Specimen Description BLOOD RIGHT ARM   Final   Special Requests BOTTLES DRAWN AEROBIC AND ANAEROBIC [facility]3CC EACH   Final   Culture  Setup Time     Final   Value: 09/29/2013 14:07  Performed at [facility]     Final   Value: NO GROWTH 5 DAYS     Performed at [facility]   Report Status 10/05/2013 FINAL   Final  URINE CULTURE     Status: None   Collection Time    09/29/13  1:39 PM      Result Value Ref Range Status   Specimen Description URINE, CATHETERIZED   Final   Special Requests NONE   Final   Culture  Setup Time     Final   Value: 09/29/2013 17:23     Performed at [facility] Count     Final   Value: NO GROWTH     Performed at [facility]   Culture     Final   Value: NO GROWTH     Performed at [facility]   Report Status 09/30/2013 FINAL   Final     Scheduled Meds: . antiseptic oral rinse  7 mL Mouth Rinse BID  . aspirin  81 mg Oral q morning - 10a  . enoxaparin (LOVENOX) injection  40 mg Subcutaneous Q24H  . fluconazole (DIFLUCAN) IV  100 mg Intravenous Q24H  . hydrALAZINE  10 mg Oral 4 times per day  . insulin aspart  0-5 Units Subcutaneous QHS  . insulin aspart  0-9 Units Subcutaneous TID WC  . metoprolol tartrate  25 mg Oral BID  . miconazole  1 application Topical BID  . piperacillin-tazobactam (ZOSYN)  IV  3.375 g  Intravenous Q8H  . psyllium  1 packet Oral q morning - 10a   Continuous Infusions: . sodium chloride 10 mL/hr at 10/05/13 1800

## 2013-10-06 NOTE — Progress Notes (Signed)
CARE MANAGEMENT NOTE 10/06/2013  Patient:  Desiree Gonzalez,Desiree Gonzalez   Account Number:  [id number]  Date Initiated:  09/29/2013  Documentation initiated by:  Adiyah Lame  Subjective/Objective Assessment:   pt admitted unresponsvie from local snf, was found down this am, dnr status, poss sepsis, hypothermic and hypotensive-iv ns bolus and iv abx started     Action/Plan:   return to snf when able   Anticipated DC Date:  10/07/2013   Anticipated DC Plan:  SKILLED NURSING FACILITY  In-house referral  Clinical Social Worker      DC Planning Services  NA      [facility]  NA   Choice offered to / List presented to:  NA   DME arranged  NA      DME agency  NA     HH arranged  NA      HH agency  NA   Status of service:  In process, will continue to follow Medicare Important Message given?  NA - LOS <3 / Initial given by admissions (If response is "NO", the following Medicare IM given date fields will be blank) Date Medicare IM given:   Medicare IM given by:   Date Additional Medicare IM given:   Additional Medicare IM given by:    Discharge Disposition:    Per UR Regulation:  Reviewed for med. necessity/level of care/duration of stay  If discussed at Long Length of Stay Meetings, dates discussed:    Comments:  [id number]/Desiree Howlett Stark JockDavis, Desiree Gonzalez, Desiree Gonzalez, [place] [phone] Chart Reviewed for discharge and hospital needs. Discharge needs at time of review: Patient to be transferred from icu to tele floor.  None present will follow for needs.  plan is for transfer to snf versus alf. Review of patient progress due on [id number]  [id number]/Desiree Rothman Earlene Plateravis, Desiree Gonzalez, Desiree Gonzalez, Desiree Gonzalez: CHART REVIEWED AND UPDATED. patient hy[pothermic and return to sdu for use of warming blacket.  Next chart review due on [id number]. NO DISCHARGE NEEDS PRESENT AT THIS TIME WILL CONTINUE TO FOLLOW. CASE MANAGEMENT [phone]    Desiree LoserRhonda Sherman Donaldson,Desiree Gonzalez,Desiree Gonzalez,Desiree Gonzalez: next review due on [id number].

## 2013-10-06 NOTE — Progress Notes (Signed)
Physical Therapy Treatment Patient Details Name: Desiree Gonzalez MRN: [id number] DOB: [birth year] Today's Date: 10/06/2013    History of Present Illness Desiree Apleystelle M Hodgkiss is a 80 y.o. female with a past medical history of dementia, currently nursing home resident who was found unresponsive and brought to the ER on 09/29/13. Pt found to be hypothermic.    PT Comments    Progressing slowly with mobility however pt continues to require +2 assist for mobility. Continue to recommend SNF unless facility feels they can manage safely with pt at current level.   Follow Up Recommendations  SNF     Equipment Recommendations  None recommended by PT    Recommendations for Other Services       Precautions / Restrictions Precautions Precautions: Fall Precaution Comments: incontinence Restrictions Weight Bearing Restrictions: No    Mobility  Bed Mobility Overal bed mobility: Needs Assistance Bed Mobility: Supine to Sit;Sit to Supine     Supine to sit: Mod assist;+2 for physical assistance Sit to supine: Mod assist;+2 for physical assistance   General bed mobility comments: Increased time. Assist for trunk and bil LEs. Utilized bedpad for scooting, positioning  Transfers Overall transfer level: Needs assistance Equipment used: Rolling walker (2 wheeled) Transfers: Sit to/from Stand Sit to Stand: Mod assist;+2 physical assistance         General transfer comment: x2. Sit to stand with RW-pt able to stand for ~20 seconds each attempt. Pt tends to maintain bil knee flexion and LE instability noted.   Ambulation/Gait                 Stairs            Wheelchair Mobility    Modified Rankin (Stroke Patients Only)       Balance           Standing balance support: Bilateral upper extremity supported;During functional activity Standing balance-Leahy Scale: Poor                      Cognition Arousal/Alertness: Awake/alert Behavior During Therapy: Flat  affect Overall Cognitive Status: History of cognitive impairments - at baseline                      Exercises      General Comments        Pertinent Vitals/Pain Pain Assessment: No/denies pain    Home Living Family/patient expects to be discharged to:: Skilled nursing facility                    Prior Function            PT Goals (current goals can now be found in the care plan section) Progress towards PT goals: Progressing toward goals (slowly)    Frequency  Min 2X/week    PT Plan Current plan remains appropriate    Co-evaluation             End of Session Equipment Utilized During Treatment: Gait belt Activity Tolerance: Patient tolerated treatment well Patient left: in bed;with call bell/phone within reach;with family/visitor present     Time: 4098-11911516-1531 PT Time Calculation (min): 15 min  Charges:  $Therapeutic Activity: 8-22 mins                    G Codes:      Rebeca AlertJannie Tondalaya Perren, MPT Pager: [phone] \

## 2013-10-06 NOTE — Progress Notes (Signed)
Received report from ICU RN, Pt arrived unit, alert to self, disoriented to place, time and situation. Will continue with current plan of care.

## 2013-10-07 LAB — BASIC METABOLIC PANEL
ANION GAP: 13 (ref 5–15)
BUN: 20 mg/dL (ref 6–23)
CO2: 22 mEq/L (ref 19–32)
Calcium: 9.2 mg/dL (ref 8.4–10.5)
Chloride: 102 mEq/L (ref 96–112)
Creatinine, Ser: 1.11 mg/dL — ABNORMAL HIGH (ref 0.50–1.10)
GFR, EST AFRICAN AMERICAN: 50 mL/min — AB (ref >90)
GFR, EST NON AFRICAN AMERICAN: 43 mL/min — AB (ref >90)
Glucose, Bld: 124 mg/dL — ABNORMAL HIGH (ref 70–99)
POTASSIUM: 3.9 meq/L (ref 3.7–5.3)
Sodium: 137 mEq/L (ref 137–147)

## 2013-10-07 LAB — GLUCOSE, CAPILLARY
GLUCOSE-CAPILLARY: 190 mg/dL — AB (ref 70–99)
GLUCOSE-CAPILLARY: 113 mg/dL — AB (ref 70–99)
Glucose-Capillary: 148 mg/dL — ABNORMAL HIGH (ref 70–99)
Glucose-Capillary: 168 mg/dL — ABNORMAL HIGH (ref 70–99)

## 2013-10-07 MED ORDER — FLUCONAZOLE 100 MG PO TABS
100.0000 mg | ORAL_TABLET | Freq: Every day | ORAL | Status: DC
  Administered 2013-10-07 – 2013-10-10 (×4): 100 mg via ORAL
  Filled 2013-10-07 (×4): qty 1

## 2013-10-07 MED ORDER — ENSURE COMPLETE PO LIQD
237.0000 mL | Freq: Two times a day (BID) | ORAL | Status: DC
  Administered 2013-10-07 – 2013-10-10 (×5): 237 mL via ORAL

## 2013-10-07 NOTE — Progress Notes (Signed)
Clinical Social Work  CSW spoke with ALF who reports they do not feel comfortable accepting patient until she has had a ST SNF stay. CSW previously spoke with dtr about [facility] and left a message with dtr explaining ALF's decision re: SNF placement. CSW spoke with SNF who is agreeable to accept when medically stable. CSW submitted for SNF PASRR which was received. CSW will continue to follow to assist with DC planning.  UnderwoodHolly Devanshi Califf, KentuckyLCSW [phone]

## 2013-10-07 NOTE — Progress Notes (Signed)
Speech Language Pathology Treatment: Dysphagia  Patient Details Name: CAMYRA VAETH MRN: [id number] DOB: [birth year] Today's Date: 10/07/2013 Time: 4360-1658 SLP Time Calculation (min): 25 min  Assessment / Plan / Recommendation Clinical Impression  Intake listed as 40% and lungs are decreased but clear indicating good tolerance.  Pt with lunch tray at bedside, she did not initiate intake (besides chocolate pudding sitting open on tray) but did consume food on plate when SLP set up her tray for her.    Pt's cognitive deficit impacts her ability to follow compensation strategies and subsequently full supervision is indicated.  She tends to stuff her mouth full without swallowing and SLP had to cover her plate with her lid to get her to stop intake and clear mouth before taking more.  SLP explained clinical reasoning for aspiration precautions but pt did not verbally respond and cognitive deficit impairs comprehension.  No family present at this time.    Recommend continue dys3/thin with supervision for maximal airway protection.  Anticipate pt will benefit from softer foods chronically and full assistance at SNF.    Will sign off as all goals met (once modified to maximum) and pt tolerating po.    HPI HPI: 80 year old female admitted 09/29/13 due to unresponsiveness.  PMH significant for dementia, CVA, GERD.  BSE done with recommendation for puree/nectar diet.  Pt being followed for dysphagia management.  CXR 7/30 Borderline cardiomegaly, left lung base atelectasis and this low inspiratory exam.  MRI yesterday showed wallerian degeneration in right brain stem, sub acute left basal ganglia cva, evolution of 2011 large right basal ganglia cva.     Pertinent Vitals Pain Assessment: No/denies pain  SLP Plan  Continue with current plan of care    Recommendations Diet recommendations: Dysphagia 3 (mechanical soft);Thin liquid Liquids provided via: Cup;Straw Medication Administration: Whole meds with  puree (follow with liquids) Supervision: Staff to assist with self feeding Compensations: Slow rate;Small sips/bites (cue pt to swallow to clear oral residuals, use applesauce as needed) Postural Changes and/or Swallow Maneuvers: Upright 30-60 min after meal;Seated upright 90 degrees              Oral Care Recommendations: Oral care BID Follow up Recommendations: Skilled Nursing facility Plan: Continue with current plan of care    Howe, [place], [place] [facility] SLP [phone]

## 2013-10-07 NOTE — Evaluation (Signed)
Occupational Therapy Evaluation Patient Details Name: Desiree Gonzalez MRN: [id number] DOB: 05/03/1923 Today's Date: 10/07/2013    History of Present Illness Desiree Gonzalez is a 80 y.o. female with a past medical history of dementia, currently from memory care at ALF who was found unresponsive and brought to the ER on 09/29/13. Pt found to be hypothermic.   Clinical Impression    Pt was admitted for the above.  She will need A x 2 for LB adls and transfers.  Recommend SNF unless currently memory care ALF can manage her safely at current level.  Will defer further OT to next venue of care  Follow Up Recommendations  SNF (unless Memory care can manage her at A x 2 level)    Equipment Recommendations  None recommended by OT    Recommendations for Other Services       Precautions / Restrictions Precautions Precautions: Fall Precaution Comments: incontinence Restrictions Weight Bearing Restrictions: No      Mobility Bed Mobility         Supine to sit: Max assist Sit to supine: Mod assist   General bed mobility comments: HOB raised, increased time  Transfers                 General transfer comment: not attempted with +1 today    Balance                                            ADL Overall ADL's : Needs assistance/impaired Eating/Feeding:  (not tested; needs full supervision)   Grooming: Total assistance;Moderate assistance (mod for hands; total for all others--less than 10%)                                 General ADL Comments: total A for UB adls except as noted and total A x 2 for LB adls and transfers for safety.  Sat at EOB with min guard for 5 minutes.  Max A and HOB raised to get to [employer]     Praxis      Pertinent Vitals/Pain Pain Assessment: No/denies pain     Hand Dominance     Extremity/Trunk Assessment Upper Extremity Assessment Upper Extremity  Assessment: Generalized weakness           Communication Communication Communication: HOH   Cognition Arousal/Alertness: Awake/alert Behavior During Therapy: Flat affect Overall Cognitive Status: History of cognitive impairments - at baseline                     General Comments       Exercises       Shoulder Instructions      Home Living Family/patient expects to be discharged to:: Skilled nursing facility                                        Prior Functioning/Environment          Comments: unknown PLOF.  Likely needed A    OT Diagnosis:     OT Problem List:     OT  Treatment/Interventions:      OT Goals(Current goals can be found in the care plan section)    OT Frequency:     Barriers to D/C:            Co-evaluation              End of Session    Activity Tolerance: Patient tolerated treatment well Patient left: in bed;with call bell/phone within reach;with bed alarm set   Time: [id number] OT Time Calculation (min): 18 min Charges:  OT General Charges $OT Visit: 1 Procedure OT Evaluation $Initial OT Evaluation Tier I: 1 Procedure OT Treatments $Self Care/Home Management : 8-22 mins G-Codes:    Sheriece Jefcoat 10/07/2013, 10:06 AM Marica OtterMaryellen Ellington [place], OTR/L [phone] 10/07/2013

## 2013-10-07 NOTE — Progress Notes (Addendum)
ANTIBIOTIC CONSULT NOTE - Follow-up  Pharmacy Consult for Zosyn Indication: Recurrent UTI/E coli  No Known Allergies  Patient Measurements: Height: 5\' 7"  (170.2 cm) Weight: 201 lb 4.5 oz (91.3 kg) IBW/kg (Calculated) : 61.6  Vital Signs: Temp: 98.1 F (36.7 C) (08/07 0525) Temp src: Oral (08/07 0525) BP: 141/55 mmHg (08/07 0525) Pulse Rate: 71 (08/07 0525) Intake/Output from previous day: 08/06 0701 - 08/07 0700 In: 300 [P.O.:100; IV Piggyback:200] Out: 2555 [Urine:2555] Intake/Output from this shift:    Labs:  Recent Labs  10/06/13 0535 10/07/13 0512  CREATININE 1.12* 1.11*   Estimated Creatinine Clearance: 40.6 ml/min (by C-G formula based on Cr of 1.11). No results found for this basename: VANCOTROUGH, Leodis BinetVANCOPEAK, VANCORANDOM, GENTTROUGH, GENTPEAK, GENTRANDOM, TOBRATROUGH, TOBRAPEAK, TOBRARND, AMIKACINPEAK, AMIKACINTROU, AMIKACIN,  in the last 72 hours   Microbiology: Recent Results (from the past 720 hour(s))  URINE CULTURE     Status: None   Collection Time    09/18/13  8:29 AM      Result Value Ref Range Status   Specimen Description URINE, CATHETERIZED   Final   Special Requests NONE   Final   Culture  Setup Time     Final   Value: 09/18/2013 19:51     Performed at [facility]   Colony Count     Final   Value: >=100,000 COLONIES/ML     [facility]formed at [facility]   Culture     Final   Value: ESCHERICHIA COLI     Performed at [facility]   Report Status 09/21/2013 FINAL   Final   Organism ID, Bacteria ESCHERICHIA COLI   Final  CULTURE, BLOOD (ROUTINE X 2)     Status: None   Collection Time    09/18/13  9:04 AM      Result Value Ref Range Status   Specimen Description BLOOD LEFT HAND   Final   Special Requests BOTTLES DRAWN AEROBIC AND ANAEROBIC 1CC   Final   Culture  Setup Time     Final   Value: 09/18/2013 18:06     Performed at [facility]   Culture     Final   Value: NO GROWTH 5 DAYS     [facility]formed at [facility]   Report Status 09/24/2013 FINAL   Final  CULTURE, BLOOD (ROUTINE X 2)     Status: None   Collection Time    09/18/13  9:34 AM      Result Value Ref Range Status   Specimen Description BLOOD RIGHT ARM   Final   Special Requests BOTTLES DRAWN AEROBIC AND ANAEROBIC 5CC   Final   Culture  Setup Time     Final   Value: 09/18/2013 18:07     Performed at [facility]   Culture     Final   Value: NO GROWTH 5 DAYS     Performed at [facility]   Report Status 09/24/2013 FINAL   Final  URINE CULTURE     Status: None   Collection Time    09/18/13 12:14 PM      Result Value Ref Range Status   Specimen Description URINE, CATHETERIZED   Final   Special Requests NONE   Final   Culture  Setup Time     Final   Value: 09/18/2013 19:51     Performed at [facility]   Colony Count     Final   Value: 25,000 COLONIES/ML  Performed at [facility]     Final   Value: ESCHERICHIA COLI     Performed at [facility]   Report Status 09/21/2013 FINAL   Final   Organism ID, Bacteria ESCHERICHIA COLI   Final  MRSA PCR SCREENING     Status: None   Collection Time    09/18/13 12:17 PM      Result Value Ref Range Status   MRSA by PCR NEGATIVE  NEGATIVE Final   Comment:            The GeneXpert MRSA Assay (FDA     approved for NASAL specimens     only), is one component of a     comprehensive MRSA colonization     surveillance program. It is not     intended to diagnose MRSA     infection nor to guide or     monitor treatment for     MRSA infections.  CULTURE, BLOOD (ROUTINE X 2)     Status: None   Collection Time    09/18/13  3:54 PM      Result Value Ref Range Status   Specimen Description BLOOD RIGHT HAND   Final   Special Requests BOTTLES DRAWN AEROBIC AND ANAEROBIC 10 CC   Final   Culture  Setup Time     Final   Value: 09/19/2013 08:44     Performed at [facility]   Culture     Final   Value: NO GROWTH 5 DAYS      Performed at [facility]   Report Status 09/25/2013 FINAL   Final  CULTURE, BLOOD (ROUTINE X 2)     Status: None   Collection Time    09/18/13  4:13 PM      Result Value Ref Range Status   Specimen Description BLOOD LEFT HAND   Final   Special Requests BOTTLES DRAWN AEROBIC AND ANAEROBIC 10CC   Final   Culture  Setup Time     Final   Value: 09/19/2013 08:45     Performed at [facility]   Culture     Final   Value: NO GROWTH 5 DAYS     Performed at [facility]   Report Status 09/25/2013 FINAL   Final  CULTURE, BLOOD (ROUTINE X 2)     Status: None   Collection Time    09/29/13 12:29 PM      Result Value Ref Range Status   Specimen Description BLOOD LEFT HAND   Final   Special Requests BOTTLES DRAWN AEROBIC ONLY 2CC   Final   Culture  Setup Time     Final   Value: 09/29/2013 14:07     Performed at [facility]   Culture     Final   Value: NO GROWTH 5 DAYS     Performed at [facility]   Report Status 10/05/2013 FINAL   Final  CULTURE, BLOOD (ROUTINE X 2)     Status: None   Collection Time    09/29/13 12:32 PM      Result Value Ref Range Status   Specimen Description BLOOD RIGHT ARM   Final   Special Requests BOTTLES DRAWN AEROBIC AND ANAEROBIC [facility] EACH   Final   Culture  Setup Time     Final   Value: 09/29/2013 14:07     Performed at [facility]   Culture     Final  Value: NO GROWTH 5 DAYS     Performed at [facility]   Report Status 10/05/2013 FINAL   Final  URINE CULTURE     Status: None   Collection Time    09/29/13  1:39 PM      Result Value Ref Range Status   Specimen Description URINE, CATHETERIZED   Final   Special Requests NONE   Final   Culture  Setup Time     Final   Value: 09/29/2013 17:23     Performed at [facility]   Colony Count     Final   Value: NO GROWTH     Performed at [facility]   Culture     Final   Value: NO GROWTH     Performed at [facility]   Report  Status 09/30/2013 FINAL   Final   Anti-infectives   Start     Dose/Rate Route Frequency Ordered Stop   09/30/13 1000  fluconazole (DIFLUCAN) IVPB 100 mg     100 mg 50 mL/hr over 60 Minutes Intravenous Every 24 hours 09/30/13 0844     09/29/13 2000  ciprofloxacin (CIPRO) IVPB 400 mg  Status:  Discontinued     400 mg 200 mL/hr over 60 Minutes Intravenous Every 12 hours 09/29/13 1540 09/29/13 1550   09/29/13 2000  metroNIDAZOLE (FLAGYL) IVPB 500 mg  Status:  Discontinued     500 mg 100 mL/hr over 60 Minutes Intravenous Every 8 hours 09/29/13 1540 09/29/13 1550   09/29/13 2000  piperacillin-tazobactam (ZOSYN) IVPB 3.375 g     3.375 g 12.5 mL/hr over 240 Minutes Intravenous Every 8 hours 09/29/13 1610     09/29/13 1245  vancomycin (VANCOCIN) IVPB 1000 mg/200 mL premix     1,000 mg 200 mL/hr over 60 Minutes Intravenous  Once 09/29/13 1234 09/29/13 1420   09/29/13 1245  piperacillin-tazobactam (ZOSYN) IVPB 3.375 g     3.375 g 100 mL/hr over 30 Minutes Intravenous  Once 09/29/13 1234 09/29/13 1316     Assessment: 89 yoF unresponsive at SNF, to ED 7/30, admit for IV abx-likely urosepsis. Last admit 7/19-7/23 for urosepsis 2/2 E coli in urine treated with Zosyn, continue Cipro at [facility].   7/30 >> Zosyn >> 7/30 >> Vanc x1  7/31 >> Fluconazole >>  Tmax: HYPOthermic at times WBCs: WNL Renal: fluctuating but near baseline (1.1-1.2), Cl ~ 47N  12/11/12 urine: E coli ESBL - ordered Septra 7/19 urine (@08 :29): >100K E coli NOT ESBL - treated w/Zosyn, then to SNF on Cipro 7/29 urine (@12 :14): 25K E. Coli NOT ESBL 7/30 blood: NGTD 7/30 urine: NGF  Goal of Therapy:  Antibiotic/dose/schedule appropriate for renal function, pathogen, disease state  Plan:  Day #9 zosyn, D#8 fluconazole  Continue Zosyn 3.375g IV q8h (infuse over 4 hours)  pharmacy will follow at distance as no adjustment needed at this time  ? Add stop date for 8/8 to complete 10-days therapy  If concerned about ESBL then  antibiotics should be changed to imipenem or PO fosfomycin  Meets P&T criteria to change to PO fluconazole, change to 100mg  PO daily  . The patient is eating (either orally or per tube) and/or has been taking other orally administered medications for at least 24 hours.  . This patient has no evidence of active gastrointestinal bleeding or impaired GI absorption (gastrectomy, short bowel, patient on TNA or NPO).   Juliette Alcide, PharmD, BCPS.   Pager: [phone] 10/07/2013 8:22 AM

## 2013-10-07 NOTE — Progress Notes (Signed)
Patient ID: Desiree Gonzalez, female   DOB: 05/13/1923, 80 y.o.   MRN: [id number] [facility] PROGRESS NOTE  Desiree Gonzalez FAO:[id number] DOB: 05/13/1923 DOA: 09/29/2013 PCP: Elie ConferWESTERMANN,CAROLA JO, MD  Brief narrative: 80 y.o. female nursing home resident with a PMH of recent hospitalization 09/18/13-09/22/13 for treatment of ESBL Escherichia coli, history of dementia who at baseline is alert and conversant but confused. [facility] after he was found unresponsive by staff members on 09/29/13. Upon initial evaluation in the ED, the patient was hypothermic with a temperature of 94.5 F. CT scan of the abdomen showed diverticulitis. The patient showed signs of improvement by 10/01/2013, but then developed recurrent hyperthermia necessitating transfer back to the SDU for treatment with the bear hugger. Hospital course complicated due to prolonged encephalopathy. MRI brain showed subacute lacunar infarct in basal ganglia.   Assessment/Plan:   Principal Problem:  Sepsis secondary to Diverticulitis versus recurrent ESBL UTI / hypothermia  Patient recently hospitalized with ESBL Escherichia coli UTI/sepsis. Urinalysis clear, urine culture negative.  Mild diverticulitis noted on CT scan of the abdomen and pelvis.  Continue zosyn for 1 more day.  Active Problems:  Acute metabolic encephalopathy / Dementia / Subacute lacunar infarct  Subacute infarct in basal ganglia. Pt is already on aspirin. PT once pt able to participate  TSH checked 09/18/13 and was 6.090 and on this admission still slightly up at 4.89 but improving. Cortisol WNL. Free T4 and T3 WNL.  Holding Aricept and Namenda until mental status improves.  Stage III chronic kidney disease  Baseline creatinine around 1.1-1.2. Creatinine at usual baseline values.  Avoid nephrotoxins (lisinopril being one of them so we stopped it 10/05/2013). Candidal intertrigo  Has been on antifungal cream, but groin folds macerated with severe  cutaneous candidiasis. Continue Diflucan. Dysphagia  Per dietitian diet advanced to dysphagia 3. DM (diabetes mellitus)  Continue SSI Accelerated hypertension  Continue metoprolol 25 mg PO BID. Stopped lisinopril due to renal insufficiency. Continue hydralazine but increase to 25 mg every 6 hours in addition to hydralazine when necessary for better blood pressure control. FTT (failure to thrive) in adult  Supportive care.  DVT prophylaxis  Lovenox ordered.   Code Status: DNR  Family Communication: Daughter Elson ClanLetha [phone]); family not at the bedside this morning.  Disposition Plan: SNF when stable. Transfer to telemetry today (order in place)    IV Access:   Peripheral IV Procedures and diagnostic studies:   Chest x-ray 09/29/13: Borderline cardiomegaly, left lung base atelectasis and low inspiratory effort.  CT of the head 09/29/13: Remote infarcts and small vessel disease type changes without CT evidence of large acute infarct. No intracranial hemorrhage. Atrophy without hydrocephalus. Polypoid opacity occasion left frontal sinus.  CT abdomen and pelvis 09/29/13: Mild changes of left colonic diverticulitis. No evidence of abscess.  MRI brain 8//06/2013 - subacute lacunar infarct in left basal ganglia  Medical Consultants:   None. Other Consultants:   Speech therapy: Dysphagia 3 diet  Physical therapy. Anti-Infectives:   Zosyn 09/29/13 --->  Fluconazole 09/30/2013 -->   Manson PasseyEVINE, Milind Raether, MD  [facility] Pager [phone]  If 7PM-7AM, please contact night-coverage [email or web address] Password [email or web address] 10/07/2013, 10:46 AM   LOS: 8 days    HPI/Subjective: No acute overnight events.  Objective: Filed Vitals:   10/06/13 2200 10/06/13 2342 10/07/13 0200 10/07/13 0525  BP: 152/57 152/70 127/61 141/55  Pulse: 80  65 71  Temp: 98.2 F (36.8 C)  98.2 F (36.8 C) 98.1 F (36.7 C)  TempSrc: Axillary  Axillary Oral  Resp: 20  18 20   Height:      Weight:      SpO2: 95%  94% 95%     Intake/Output Summary (Last 24 hours) at 10/07/13 1046 Last data filed at 10/07/13 0900  Gross per 24 hour  Intake    370 ml  Output   2205 ml  Net  -1835 ml    Exam:   General:  Pt is alert,  not in acute distress  Cardiovascular: Regular rate and rhythm, S1/S2 appreciated   Respiratory: Clear to auscultation bilaterally, no wheezing  Abdomen: Soft, non tender, non distended, bowel sounds present  Extremities: No edema, pulses DP and PT palpable bilaterally  Neuro: Grossly nonfocal  Data Reviewed: Basic Metabolic Panel:  Recent Labs Lab 10/01/13 0541 10/03/13 0341 10/04/13 0345 10/06/13 0535 10/07/13 0512  NA 139 138 137 135* 137  K 4.1 3.8 3.7 4.1 3.9  CL 106 103 101 100 102  CO2 22 22 25 23 22   GLUCOSE 132* 126* 128* 120* 124*  BUN 19 16 19 19 20   CREATININE 1.22* 1.13* 1.22* 1.12* 1.11*  CALCIUM 8.6 8.8 9.1 9.1 9.2   Liver Function Tests: No results found for this basename: AST, ALT, ALKPHOS, BILITOT, PROT, ALBUMIN,  in the last 168 hours No results found for this basename: LIPASE, AMYLASE,  in the last 168 hours No results found for this basename: AMMONIA,  in the last 168 hours CBC:  Recent Labs Lab 10/01/13 0541 10/03/13 0341 10/04/13 0345  WBC 8.6 7.2 6.4  HGB 10.3* 10.1* 10.8*  HCT 31.3* 29.2* 31.5*  MCV 84.8 84.6 85.4  PLT 228 193 217   Cardiac Enzymes: No results found for this basename: CKTOTAL, CKMB, CKMBINDEX, TROPONINI,  in the last 168 hours BNP: No components found with this basename: POCBNP,  CBG:  Recent Labs Lab 10/06/13 0756 10/06/13 1220 10/06/13 1628 10/06/13 2211 10/07/13 0759  GLUCAP 122* 179* 139* 120* 148*    Recent Results (from the past 240 hour(s))  CULTURE, BLOOD (ROUTINE X 2)     Status: None   Collection Time    09/29/13 12:29 PM      Result Value Ref Range Status   Specimen Description BLOOD LEFT HAND   Final   Special Requests BOTTLES DRAWN AEROBIC ONLY 2CC   Final   Culture  Setup Time      Final   Value: 09/29/2013 14:07     Performed at [facility]   Culture     Final   Value: NO GROWTH 5 DAYS     Performed at [facility]   Report Status 10/05/2013 FINAL   Final  CULTURE, BLOOD (ROUTINE X 2)     Status: None   Collection Time    09/29/13 12:32 PM      Result Value Ref Range Status   Specimen Description BLOOD RIGHT ARM   Final   Special Requests BOTTLES DRAWN AEROBIC AND ANAEROBIC [facility] EACH   Final   Culture  Setup Time     Final   Value: 09/29/2013 14:07     Performed at [facility]   Culture     Final   Value: NO GROWTH 5 DAYS     Performed at [facility]   Report Status 10/05/2013 FINAL   Final  URINE CULTURE     Status: None   Collection Time    09/29/13  1:39 PM  Result Value Ref Range Status   Specimen Description URINE, CATHETERIZED   Final   Special Requests NONE   Final   Culture  Setup Time     Final   Value: 09/29/2013 17:23     Performed at [facility]   Colony Count     Final   Value: NO GROWTH     Performed at [facility]   Culture     Final   Value: NO GROWTH     Performed at [facility]   Report Status 09/30/2013 FINAL   Final     Scheduled Meds: . antiseptic oral rinse  7 mL Mouth Rinse BID  . aspirin  81 mg Oral q morning - 10a  . enoxaparin (LOVENOX) injection  40 mg Subcutaneous Q24H  . fluconazole  100 mg Oral Daily  . hydrALAZINE  25 mg Oral 4 times per day  . insulin aspart  0-5 Units Subcutaneous QHS  . insulin aspart  0-9 Units Subcutaneous TID WC  . metoprolol tartrate  25 mg Oral BID  . miconazole  1 application Topical BID  . piperacillin-tazobactam (ZOSYN)  IV  3.375 g Intravenous Q8H  . psyllium  1 packet Oral q morning - 10a   Continuous Infusions: . sodium chloride 10 mL/hr at 10/05/13 1800

## 2013-10-07 NOTE — Progress Notes (Signed)
NUTRITION FOLLOW UP  Intervention:   - Encouraged increased meal intake - Ensure Complete BID - RD to continue to monitor   Nutrition Dx:   Inadequate oral intake related to inability to eat as evidenced by NPO - improving, now related to dementia/variable intake as evidenced by documented PO intake.    Goal:   Diet advancement - met, diet advanced from NPO to dysphagia 3/thin  New goal: Pt to consume >90% of meals/supplements   Monitor:   Weights, labs, intake   Assessment:   Admitted due to unresponsiveness. PMH significant for dementia, CVA, DM, GERD.   8/6: - Seen by SLP 8/1 for bedside swallow evaluation and was noted to be at moderate aspiration risk with recommendations for dysphagia 1/nectar thick  - Seen by SLP again 8/4 and 8/5 with recommendations for dysphagia 3/thin liquid  - Currently NPO  - PO intake yesterday documented as 100%  - Attempted to meet with pt, however pt alone in room sound asleep  8/7: - Pt discussed during multidisciplinary rounds - Diet resumed to dysphagia 3/thin yesterday evening - PO intake documented as 50% - Met with pt who reports she was eating well PTA when the food was good. Agreeable to getting Ensure Complete, will order.    Height: Ht Readings from Last 1 Encounters:  09/29/13 $RemoveB'5\' 7"'[id number]$  (1.702 m)    Weight Status:   Wt Readings from Last 1 Encounters:  10/06/13 201 lb 4.5 oz (91.3 kg)    Re-estimated needs:  Kcal: 1600-1800  Protein: 65-90g  Fluid: per MD   Skin: +1 generalized edema   Diet Order: Dysphagia 3, thin    Intake/Output Summary (Last 24 hours) at 10/07/13 1306 Last data filed at 10/07/13 0900  Gross per 24 hour  Intake    320 ml  Output   1975 ml  Net  -1655 ml    Last BM: 8/5   Labs:   Recent Labs Lab 10/04/13 0345 10/06/13 0535 10/07/13 0512  NA 137 135* 137  K 3.7 4.1 3.9  CL 101 100 102  CO2 $Re'25 23 22  'CzZ$ BUN $R'19 19 20  'Fo$ CREATININE 1.22* 1.12* 1.11*  CALCIUM 9.1 9.1 9.2  GLUCOSE  128* 120* 124*    CBG (last 3)   Recent Labs  10/06/13 2211 10/07/13 0759 10/07/13 1155  GLUCAP 120* 148* 168*    Scheduled Meds: . antiseptic oral rinse  7 mL Mouth Rinse BID  . aspirin  81 mg Oral q morning - 10a  . enoxaparin (LOVENOX) injection  40 mg Subcutaneous Q24H  . fluconazole  100 mg Oral Daily  . hydrALAZINE  25 mg Oral 4 times per day  . insulin aspart  0-5 Units Subcutaneous QHS  . insulin aspart  0-9 Units Subcutaneous TID WC  . metoprolol tartrate  25 mg Oral BID  . miconazole  1 application Topical BID  . piperacillin-tazobactam (ZOSYN)  IV  3.375 g Intravenous Q8H  . psyllium  1 packet Oral q morning - 10a    Continuous Infusions: . sodium chloride 10 mL/hr at 10/05/13 [address] [place], [place], [place] [phone] Pager [phone] Weekend/After Hours Pager

## 2013-10-07 NOTE — Progress Notes (Signed)
Clinical Social Work  CSW following to assist with DC planning. Family wanted patient to be evaluated by ALF to determine if patient could return prior to making any decisions. CSW called and left a message with Shauna at ALF [phone]). CSW went to room but patient sleeping and no family present. CSW spoke with dtr via phone. Dtr confirms plans for either back to ALF or ST SNF at [facility]. Per MD, patient is not medically stable to DC today. CSW will continue to follow.  [place] Bend[place]olly Mah[place], [place] [phone]

## 2013-10-07 NOTE — Progress Notes (Signed)
Clinical Social Work  CSW left another message with ALF re: visiting patient to determine if they can accept back. CSW keeping SNF updated on plans as well. If SNF is required, CSW will submit for level A PASRR.   [place]olly Caroleann [place], [place] [phone]

## 2013-10-08 LAB — BASIC METABOLIC PANEL
ANION GAP: 11 (ref 5–15)
BUN: 21 mg/dL (ref 6–23)
CO2: 23 mEq/L (ref 19–32)
Calcium: 8.8 mg/dL (ref 8.4–10.5)
Chloride: 100 mEq/L (ref 96–112)
Creatinine, Ser: 1.01 mg/dL (ref 0.50–1.10)
GFR calc Af Amer: 56 mL/min — ABNORMAL LOW (ref >90)
GFR, EST NON AFRICAN AMERICAN: 48 mL/min — AB (ref >90)
Glucose, Bld: 138 mg/dL — ABNORMAL HIGH (ref 70–99)
POTASSIUM: 3.6 meq/L — AB (ref 3.7–5.3)
SODIUM: 134 meq/L — AB (ref 137–147)

## 2013-10-08 LAB — GLUCOSE, CAPILLARY
Glucose-Capillary: 121 mg/dL — ABNORMAL HIGH (ref 70–99)
Glucose-Capillary: 127 mg/dL — ABNORMAL HIGH (ref 70–99)
Glucose-Capillary: 137 mg/dL — ABNORMAL HIGH (ref 70–99)
Glucose-Capillary: 184 mg/dL — ABNORMAL HIGH (ref 70–99)

## 2013-10-08 MED ORDER — POTASSIUM CHLORIDE CRYS ER 20 MEQ PO TBCR
40.0000 meq | EXTENDED_RELEASE_TABLET | Freq: Once | ORAL | Status: AC
  Administered 2013-10-08: 40 meq via ORAL
  Filled 2013-10-08: qty 2

## 2013-10-08 NOTE — Progress Notes (Signed)
Patient ID: Desiree Gonzalez, female   DOB: 06-27-1923, 80 y.o.   MRN: [id number] [facility] PROGRESS NOTE  KIMIYAH BLICK WUJ:[id number] DOB: [birth year] DOA: 09/29/2013 PCP: Elie Confer, MD  Brief narrative: 80 y.o. female nursing home resident with a PMH of recent hospitalization 09/18/13-09/22/13 for treatment of ESBL Escherichia coli, history of dementia who at baseline is alert and conversant but confused. Pt presented to [facility] ED 09/29/2013 after he was found unresponsive by staff members on 09/29/13. Upon initial evaluation in the ED, the patient was hypothermic with a temperature of 94.5 F. CT scan of the abdomen showed diverticulitis. The patient showed signs of improvement by 10/01/2013, but then developed recurrent hyperthermia necessitating transfer back to the SDU for treatment with the bear hugger. Hospital course complicated due to prolonged encephalopathy. MRI brain showed subacute lacunar infarct in basal ganglia.    Assessment/Plan:   Principal Problem:  Sepsis secondary to Diverticulitis versus recurrent ESBL UTI / hypothermia  Patient recently hospitalized with ESBL Escherichia coli UTI/sepsis. Urinalysis clear, urine culture negative.  Mild diverticulitis noted on CT scan of the abdomen and pelvis.  We will stop zosyn today. Active Problems:  Acute metabolic encephalopathy / Dementia / Subacute lacunar infarct  Subacute infarct in basal ganglia. Pt is already on aspirin. PT once pt able to participate  TSH checked 09/18/13 and was 6.090 and on this admission still slightly up at 4.89 but improving. Cortisol WNL. Free T4 and T3 WNL.  Holding Aricept and Namenda until mental status improves.  Stage III chronic kidney disease  Baseline creatinine around 1.1-1.2. Creatinine at usual baseline values.  Continue to avoid nephrotoxins (lisinopril being one of them so we stopped it 10/05/2013). Candidal intertrigo  Has been on antifungal cream, but groin folds macerated with  severe cutaneous candidiasis. Continue Diflucan. Dysphagia  Tolerates dysphagia 3  DM (diabetes mellitus)  Continue SSI Accelerated hypertension   Continue metoprolol 25 mg PO BID. Stopped lisinopril due to renal insufficiency. Continue hydralazine 25 mg every 6 hours FTT (failure to thrive) in adult  Supportive care.  DVT prophylaxis  Lovenox subQ   Code Status: DNR  Family Communication: Daughter Elson Clan [phone]); Updated the daughter Lynden Ang over the phone daily  Disposition Plan: SNF when stable.    IV Access:   Peripheral IV Procedures and diagnostic studies:   Chest x-ray 09/29/13: Borderline cardiomegaly, left lung base atelectasis and low inspiratory effort.  CT of the head 09/29/13: Remote infarcts and small vessel disease type changes without CT evidence of large acute infarct. No intracranial hemorrhage. Atrophy without hydrocephalus. Polypoid opacity occasion left frontal sinus.  CT abdomen and pelvis 09/29/13: Mild changes of left colonic diverticulitis. No evidence of abscess.  MRI brain 8//06/2013 - subacute lacunar infarct in left basal ganglia  Medical Consultants:   None. Other Consultants:   Speech therapy: Dysphagia 3 diet  Physical therapy. Anti-Infectives:   Zosyn 09/29/13 ---> 10/08/2013 Fluconazole 09/30/2013 -->  Manson Passey, MD  [facility] Pager [phone]  If 7PM-7AM, please contact night-coverage [email or web address] Password [facility] 10/08/2013, 11:32 AM   LOS: 9 days    HPI/Subjective: No acute overnight events.  Objective: Filed Vitals:   10/07/13 0525 10/07/13 2313 10/08/13 0200 10/08/13 0500  BP: 141/55 173/64 144/65 140/59  Pulse: 71 73 73 71  Temp: 98.1 F (36.7 C) 98.2 F (36.8 C) 97.7 F (36.5 C) 98.1 F (36.7 C)  TempSrc: Oral Axillary Axillary Axillary  Resp: 20 18 18 16   Height:  Weight:      SpO2: 95% 98% 97% 96%    Intake/Output Summary (Last 24 hours) at 10/08/13 1132 Last data filed at 10/08/13 0934  Gross per 24  hour  Intake    700 ml  Output      0 ml  Net    700 ml    Exam:   General:  Pt is not in acute distress  Cardiovascular: Regular rate and rhythm, S1/S2, no murmurs  Respiratory: Clear to auscultation bilaterally, no wheezing  Abdomen: Soft, non tender, non distended, bowel sounds present  Extremities: No edema, pulses DP and PT palpable bilaterally  Neuro: no focal neurologic deficits  Data Reviewed: Basic Metabolic Panel:  Recent Labs Lab 10/03/13 0341 10/04/13 0345 10/06/13 0535 10/07/13 0512 10/08/13 0548  NA 138 137 135* 137 134*  K 3.8 3.7 4.1 3.9 3.6*  CL 103 101 100 102 100  CO2 22 25 23 22 23   GLUCOSE 126* 128* 120* 124* 138*  BUN 16 19 19 20 21   CREATININE 1.13* 1.22* 1.12* 1.11* 1.01  CALCIUM 8.8 9.1 9.1 9.2 8.8   Liver Function Tests: No results found for this basename: AST, ALT, ALKPHOS, BILITOT, PROT, ALBUMIN,  in the last 168 hours No results found for this basename: LIPASE, AMYLASE,  in the last 168 hours No results found for this basename: AMMONIA,  in the last 168 hours CBC:  Recent Labs Lab 10/03/13 0341 10/04/13 0345  WBC 7.2 6.4  HGB 10.1* 10.8*  HCT 29.2* 31.5*  MCV 84.6 85.4  PLT 193 217   Cardiac Enzymes: No results found for this basename: CKTOTAL, CKMB, CKMBINDEX, TROPONINI,  in the last 168 hours BNP: No components found with this basename: POCBNP,  CBG:  Recent Labs Lab 10/07/13 0759 10/07/13 1155 10/07/13 1628 10/07/13 2305 10/08/13 0750  GLUCAP 148* 168* 190* 113* 127*    CULTURE, BLOOD (ROUTINE X 2)     Status: None   Collection Time    09/29/13 12:29 PM      Result Value Ref Range Status   Specimen Description BLOOD LEFT HAND   Final   Value: NO GROWTH 5 DAYS     Performed at [facility]   Report Status 10/05/2013 FINAL   Final  CULTURE, BLOOD (ROUTINE X 2)     Status: None   Collection Time    09/29/13 12:32 PM      Result Value Ref Range Status   Specimen Description BLOOD RIGHT ARM   Final    Value: NO GROWTH 5 DAYS     Performed at [facility]   Report Status 10/05/2013 FINAL   Final  URINE CULTURE     Status: None   Collection Time    09/29/13  1:39 PM      Result Value Ref Range Status   Specimen Description URINE, CATHETERIZED   Final   Value: NO GROWTH     Performed at [facility]   Report Status 09/30/2013 FINAL   Final     Scheduled Meds: . aspirin  81 mg Oral q morning - 10a  . enoxaparin (LOVENOX) injection  40 mg Subcutaneous Q24H  . feeding supplement (ENSURE COMPLETE)  237 mL Oral BID BM  . fluconazole  100 mg Oral Daily  . hydrALAZINE  25 mg Oral 4 times per day  . insulin aspart  0-5 Units Subcutaneous QHS  . insulin aspart  0-9 Units Subcutaneous TID WC  . metoprolol tartrate  25 mg Oral BID  . miconazole  1 application Topical BID  . potassium chloride  40 mEq Oral Once  . psyllium  1 packet Oral q morning - 10a

## 2013-10-09 LAB — GLUCOSE, CAPILLARY
GLUCOSE-CAPILLARY: 117 mg/dL — AB (ref 70–99)
GLUCOSE-CAPILLARY: 169 mg/dL — AB (ref 70–99)
Glucose-Capillary: 131 mg/dL — ABNORMAL HIGH (ref 70–99)
Glucose-Capillary: 133 mg/dL — ABNORMAL HIGH (ref 70–99)

## 2013-10-09 LAB — BASIC METABOLIC PANEL
Anion gap: 11 (ref 5–15)
BUN: 22 mg/dL (ref 6–23)
CO2: 22 meq/L (ref 19–32)
CREATININE: 0.93 mg/dL (ref 0.50–1.10)
Calcium: 8.9 mg/dL (ref 8.4–10.5)
Chloride: 102 mEq/L (ref 96–112)
GFR calc Af Amer: 62 mL/min — ABNORMAL LOW (ref >90)
GFR calc non Af Amer: 53 mL/min — ABNORMAL LOW (ref >90)
Glucose, Bld: 124 mg/dL — ABNORMAL HIGH (ref 70–99)
Potassium: 3.9 mEq/L (ref 3.7–5.3)
Sodium: 135 mEq/L — ABNORMAL LOW (ref 137–147)

## 2013-10-09 NOTE — Progress Notes (Signed)
Patient ID: Desiree Gonzalez, female   DOB: 01-May-1923, 80 y.o.   MRN: [id number] [facility] PROGRESS NOTE  Desiree Gonzalez WUJ:[id number] DOB: 01-May-1923 DOA: 09/29/2013 PCP: Elie ConferWESTERMANN,CAROLA JO, MD  Brief narrative: 80 y.o. female nursing home resident with a PMH of recent hospitalization 09/18/13-09/22/13 for treatment of ESBL Escherichia coli, history of dementia who at baseline is alert and conversant but confused. [facility] after he was found unresponsive by staff members on 09/29/13. Upon initial evaluation in the ED, the patient was hypothermic with a temperature of 94.5 F. CT scan of the abdomen showed diverticulitis. The patient showed signs of improvement by 10/01/2013, but then developed recurrent hyperthermia necessitating transfer back to the SDU for treatment with the bear hugger. Hospital course complicated due to prolonged encephalopathy. MRI brain showed subacute lacunar infarct in basal ganglia.   Assessment/Plan:   Principal Problem:  Sepsis secondary to Diverticulitis versus recurrent ESBL UTI / hypothermia  Patient recently hospitalized with ESBL Escherichia coli UTI/sepsis. Urinalysis clear, urine culture negative.  Mild diverticulitis noted on CT scan of the abdomen and pelvis. Has received appropriate course of abx, zosyn 10 days. Zosyn stopped 10/08/2013.  Active Problems:  Acute metabolic encephalopathy / Dementia / Subacute lacunar infarct  Subacute infarct in basal ganglia. Pt is already on aspirin. PT once pt able to participate  TSH checked 09/18/13 and was 6.090 and on this admission still slightly up at 4.89 but improving. Cortisol WNL. Free T4 and T3 WNL.  Holding Aricept and Namenda until mental status improves.  Stage III chronic kidney disease  Baseline creatinine around 1.1-1.2. Creatinine at usual baseline values.  Continue to avoid nephrotoxins (lisinopril being one of them so we stopped it 10/05/2013). Candidal intertrigo  Has been on  antifungal cream, but groin folds macerated with severe cutaneous candidiasis. Continue Diflucan. Dysphagia  Dysphagia 3 diet, tolerates well.  DM (diabetes mellitus)  Continue SSI Essential hypertension   Continue metoprolol 25 mg PO BID. Stopped lisinopril due to renal insufficiency. Continue hydralazine 25 mg every 6 hours FTT (failure to thrive) in adult  Supportive care.  DVT prophylaxis  Lovenox subQ   Code Status: DNR  Family Communication: Daughter Elson ClanLetha [phone]); Updated the daughter Lynden AngVicky over the phone daily  Disposition Plan: SNF when stable.   IV Access:   Peripheral IV Procedures and diagnostic studies:   Chest x-ray 09/29/13: Borderline cardiomegaly, left lung base atelectasis and low inspiratory effort.  CT of the head 09/29/13: Remote infarcts and small vessel disease type changes without CT evidence of large acute infarct. No intracranial hemorrhage. Atrophy without hydrocephalus. Polypoid opacity occasion left frontal sinus.  CT abdomen and pelvis 09/29/13: Mild changes of left colonic diverticulitis. No evidence of abscess.  MRI brain 8//06/2013 - subacute lacunar infarct in left basal ganglia  Medical Consultants:   None. Other Consultants:   Speech therapy: Dysphagia 3 diet  Physical therapy. Anti-Infectives:   Zosyn 09/29/13 ---> 10/08/2013  Fluconazole 09/30/2013 -->   Manson PasseyEVINE, Braiden Presutti, MD  [facility] Pager [phone]  If 7PM-7AM, please contact night-coverage [email or web address] Password [email or web address] 10/09/2013, 3:59 PM   LOS: 10 days    HPI/Subjective: No acute overnight events.  Objective: Filed Vitals:   10/08/13 1501 10/08/13 2122 10/09/13 0608 10/09/13 1300  BP: 141/60 142/70 112/46 134/60  Pulse: 79 85 75 78  Temp: 98.2 F (36.8 C) 98.3 F (36.8 C) 98.4 F (36.9 C) 98.6 F (37 C)  TempSrc: Oral Oral Axillary Axillary  Resp: 16 16  16 18  Height:      Weight:      SpO2: 97% 94% 95% 96%    Intake/Output Summary (Last 24 hours) at 10/09/13  1559 Last data filed at 10/09/13 1400  Gross per 24 hour  Intake    480 ml  Output   2400 ml  Net  -1920 ml    Exam:   General:  Pt is not in distress  Cardiovascular: Regular rate and rhythm, S1/S2, no murmurs  Respiratory: Clear to auscultation bilaterally, no wheezing  Abdomen: Soft, non tender, non distended, bowel sounds present  Extremities: Pulses DP and PT palpable bilaterally  Neuro: No focal neurologic deficits   Data Reviewed: Basic Metabolic Panel:  Recent Labs Lab 10/04/13 0345 10/06/13 0535 10/07/13 0512 10/08/13 0548 10/09/13 0500  NA 137 135* 137 134* 135*  K 3.7 4.1 3.9 3.6* 3.9  CL 101 100 102 100 102  CO2 25 23 22 23 22   GLUCOSE 128* 120* 124* 138* 124*  BUN 19 19 20 21 22   CREATININE 1.22* 1.12* 1.11* 1.01 0.93  CALCIUM 9.1 9.1 9.2 8.8 8.9   Liver Function Tests: No results found for this basename: AST, ALT, ALKPHOS, BILITOT, PROT, ALBUMIN,  in the last 168 hours No results found for this basename: LIPASE, AMYLASE,  in the last 168 hours No results found for this basename: AMMONIA,  in the last 168 hours CBC:  Recent Labs Lab 10/03/13 0341 10/04/13 0345  WBC 7.2 6.4  HGB 10.1* 10.8*  HCT 29.2* 31.5*  MCV 84.6 85.4  PLT 193 217   Cardiac Enzymes: No results found for this basename: CKTOTAL, CKMB, CKMBINDEX, TROPONINI,  in the last 168 hours BNP: No components found with this basename: POCBNP,  CBG:  Recent Labs Lab 10/08/13 1207 10/08/13 1631 10/08/13 2345 10/09/13 0802 10/09/13 1135  GLUCAP 184* 137* 121* 117* 169*    No results found for this or any previous visit (from the past 240 hour(s)).   Scheduled Meds: . aspirin  81 mg Oral q morning - 10a  . enoxaparin (LOVENOX)   40 mg Subcutaneous Q24H  . feeding supplement (ENSURE COMPLETE)  237 mL Oral BID BM  . fluconazole  100 mg Oral Daily  . hydrALAZINE  25 mg Oral 4 times per day  . insulin aspart  0-5 Units Subcutaneous QHS  . insulin aspart  0-9 Units  Subcutaneous TID WC  . metoprolol tartrate  25 mg Oral BID  . miconazole  1 application Topical BID  . psyllium  1 packet Oral q morning - 10a   Continuous Infusions: . sodium chloride 10 mL/hr at 10/05/13 1800

## 2013-10-10 LAB — BASIC METABOLIC PANEL
ANION GAP: 13 (ref 5–15)
BUN: 22 mg/dL (ref 6–23)
CO2: 22 meq/L (ref 19–32)
Calcium: 9.3 mg/dL (ref 8.4–10.5)
Chloride: 103 mEq/L (ref 96–112)
Creatinine, Ser: 0.82 mg/dL (ref 0.50–1.10)
GFR calc Af Amer: 72 mL/min — ABNORMAL LOW (ref >90)
GFR calc non Af Amer: 62 mL/min — ABNORMAL LOW (ref >90)
Glucose, Bld: 129 mg/dL — ABNORMAL HIGH (ref 70–99)
Potassium: 3.9 mEq/L (ref 3.7–5.3)
Sodium: 138 mEq/L (ref 137–147)

## 2013-10-10 LAB — GLUCOSE, CAPILLARY
Glucose-Capillary: 128 mg/dL — ABNORMAL HIGH (ref 70–99)
Glucose-Capillary: 169 mg/dL — ABNORMAL HIGH (ref 70–99)

## 2013-10-10 MED ORDER — CETYLPYRIDINIUM CHLORIDE 0.05 % MT LIQD: 7.0000 mL | Freq: Two times a day (BID) | OROMUCOSAL | Status: AC

## 2013-10-10 MED ORDER — HYDRALAZINE HCL 25 MG PO TABS: 25.0000 mg | ORAL_TABLET | Freq: Four times a day (QID) | ORAL | Status: AC

## 2013-10-10 MED ORDER — FLUCONAZOLE 100 MG PO TABS: 100.0000 mg | ORAL_TABLET | Freq: Every day | ORAL | Status: DC

## 2013-10-10 MED ORDER — ENSURE COMPLETE PO LIQD: 237.0000 mL | Freq: Two times a day (BID) | ORAL | Status: DC

## 2013-10-10 MED ORDER — ONDANSETRON HCL 4 MG PO TABS: 4.0000 mg | ORAL_TABLET | Freq: Four times a day (QID) | ORAL | Status: AC | PRN

## 2013-10-10 MED ORDER — RESOURCE THICKENUP CLEAR PO POWD: 1.0000 | ORAL | Status: DC | PRN

## 2013-10-10 NOTE — Discharge Instructions (Signed)

## 2013-10-10 NOTE — Progress Notes (Signed)
Clinical Social Work  [facility]. Patient, dtr and RN all aware of plans. RN to call report. Dtr prefers [facility] to provide transportation and aware of no guarantee of payment. Dtr to complete paperwork at SNF at 1:30 and then SNF agreeable to accept patient. CSW prepared DC packet with DNR and FL2 included. [facility] request #: [id number].  CSW is signing off but available if needed.  [place]dale[place]olly Tiziana Gonzalez, [place] [phone]

## 2013-10-10 NOTE — Progress Notes (Signed)
[facility].  Philomena Dohenyavid Sandra Brents RN

## 2013-10-10 NOTE — Discharge Summary (Addendum)
Physician Discharge Summary  Desiree Gonzalez:[id number] DOB: 05-Aug-1923 DOA: 09/29/2013  PCP: Elie Confer, MD  Admit date: 09/29/2013 Discharge date: 10/10/2013  Recommendations for Outpatient Follow-up:  Continue fluconazole for 6 ore days on discharge for candidal infection in groin area. Check CBC and BMP per SNF protocol. Renal function is WNL on discharge.Marland Kitchen   Discharge Diagnoses:  Principal Problem:   Diverticulitis Active Problems:   Dementia   DM (diabetes mellitus)   HTN (hypertension)   Sepsis   Acute encephalopathy   FTT (failure to thrive) in adult   Hypothermia   Candidal intertrigo   Stage III chronic kidney disease    Discharge Condition: stable   Diet recommendation: as tolerated dysphagia 3 diet   History of present illness:  80 y.o. female nursing home resident with a PMH of recent hospitalization 09/18/13-09/22/13 for treatment of ESBL Escherichia coli, history of dementia who at baseline is alert and conversant but confused. Pt presented to [facility] ED 09/29/2013 after he was found unresponsive by staff members on 09/29/13. Upon initial evaluation in the ED, the patient was hypothermic with a temperature of 94.5 F. CT scan of the abdomen showed diverticulitis. The patient showed signs of improvement by 10/01/2013, but then developed recurrent hyperthermia necessitating transfer back to the SDU for treatment with the bear hugger. Hospital course complicated due to prolonged encephalopathy. MRI brain showed subacute lacunar infarct in basal ganglia.   Assessment/Plan:   Principal Problem:  Sepsis secondary to Diverticulitis versus recurrent ESBL UTI / hypothermia  Patient recently hospitalized with ESBL Escherichia coli UTI/sepsis. Urinalysis clear, urine culture negative.  Mild diverticulitis noted on CT scan of the abdomen and pelvis. Has received appropriate course of abx, zosyn 10 days. Zosyn stopped 10/08/2013. Active Problems:  Acute metabolic  encephalopathy / Dementia / Subacute lacunar infarct  Subacute infarct in basal ganglia. Pt is already on aspirin. Likely not a candidate for [facility] due to dementia and falls. May continue aspirin daily. TSH checked 09/18/13 and was 6.090 and on this admission still slightly up at 4.89 but improving. Cortisol WNL. Free T4 and T3 WNL.  May restart namenda and aricept on discharge. Stage III chronic kidney disease  Baseline creatinine around 1.1-1.2. Creatinine at usual baseline values.  Continue to avoid nephrotoxins (lisinopril and hctz being one of them so we stopped it 10/05/2013). Candidal intertrigo  Has been on antifungal cream, but groin folds macerated with severe cutaneous candidiasis. Continue Diflucan for 6 more days on discharge. Dysphagia  Dysphagia 3 diet, tolerates well.  DM (diabetes mellitus)  Continue her glucotrol per prior dose at ALF Essential hypertension  Continue metoprolol 25 mg PO BID. Stopped lisinopril and Hctz due to renal insufficiency. Continue hydralazine 25 mg every 6 hours FTT (failure to thrive) in adult  Supportive care.  DVT prophylaxis  Lovenox subQ while pt is in hospital   Code Status: DNR  Family Communication: Daughter Elson Clan [phone]); Updated the daughter Lynden Ang over the phone daily     IV Access:   Peripheral IV Procedures and diagnostic studies:   Chest x-ray 09/29/13: Borderline cardiomegaly, left lung base atelectasis and low inspiratory effort.  CT of the head 09/29/13: Remote infarcts and small vessel disease type changes without CT evidence of large acute infarct. No intracranial hemorrhage. Atrophy without hydrocephalus. Polypoid opacity occasion left frontal sinus.  CT abdomen and pelvis 09/29/13: Mild changes of left colonic diverticulitis. No evidence of abscess.  MRI brain 8//06/2013 - subacute lacunar infarct in left basal ganglia  Medical Consultants:   None. Other Consultants:   Speech therapy: Dysphagia 3 diet  Physical  therapy. Anti-Infectives:   Zosyn 09/29/13 ---> 10/08/2013  Fluconazole 09/30/2013 --> for 6 more days on discharge  Signed:  Manson Passey, MD  [facility] 10/10/2013, 10:22 AM  Pager #: [phone]  Discharge Exam: Filed Vitals:   10/10/13 0456  BP: 135/69  Pulse: 71  Temp: 97.6 F (36.4 C)  Resp: 18   Filed Vitals:   10/09/13 0608 10/09/13 1300 10/09/13 2135 10/10/13 0456  BP: 112/46 134/60 153/64 135/69  Pulse: 75 78 77 71  Temp: 98.4 F (36.9 C) 98.6 F (37 C) 97.9 F (36.6 C) 97.6 F (36.4 C)  TempSrc: Axillary Axillary Axillary Axillary  Resp: 16 18 18 18   Height:      Weight:      SpO2: 95% 96% 96% 96%    General: Pt is alert, follows commands appropriately, not in acute distress Cardiovascular: Regular rate and rhythm, S1/S2 appreciated  Respiratory: Clear to auscultation bilaterally, no wheezing Abdominal: Soft, non tender, non distended, bowel sounds +, no guarding Extremities: no cyanosis, pulses palpable bilaterally DP and PT Neuro: No focal neurologic deficits.   Discharge Instructions  Discharge Instructions   Call MD for:  difficulty breathing, headache or visual disturbances    Complete by:  As directed      Call MD for:  persistant dizziness or light-headedness    Complete by:  As directed      Call MD for:  persistant nausea and vomiting    Complete by:  As directed      Call MD for:  severe uncontrolled pain    Complete by:  As directed      Diet - low sodium heart healthy    Complete by:  As directed      Discharge instructions    Complete by:  As directed   Continue fluconazole for 6 ore days on discharge for candidal infection in groin area.     Increase activity slowly    Complete by:  As directed             Medication List    STOP taking these medications       ciprofloxacin 500 MG tablet  Commonly known as:  CIPRO     hydrochlorothiazide 12.5 MG capsule  Commonly known as:  MICROZIDE     lisinopril 10 MG tablet   Commonly known as:  PRINIVIL,ZESTRIL     oxyCODONE 5 MG immediate release tablet  Commonly known as:  Oxy IR/ROXICODONE      TAKE these medications       acetaminophen 500 MG tablet  Commonly known as:  TYLENOL  Take 1,000 mg by mouth every 6 (six) hours as needed for moderate pain.     antiseptic oral rinse 0.05 % Liqd solution  Commonly known as:  CPC / CETYLPYRIDINIUM CHLORIDE 0.05%  7 mLs by Mouth Rinse route 2 (two) times daily.     aspirin 81 MG chewable tablet  Chew 81 mg by mouth every morning.     BAZA ANTIFUNGAL 2 % cream  Generic drug:  miconazole  Apply 1 application topically as needed (incontinent episodes).     cholecalciferol 400 UNITS Tabs tablet  Commonly known as:  VITAMIN D  Take 400 Units by mouth every morning.     donepezil 10 MG tablet  Commonly known as:  ARICEPT  Take 10 mg by mouth at bedtime.  feeding supplement (ENSURE COMPLETE) Liqd  Take 237 mLs by mouth 2 (two) times daily between meals.     fluconazole 100 MG tablet  Commonly known as:  DIFLUCAN  Take 1 tablet (100 mg total) by mouth daily.     glipiZIDE 10 MG 24 hr tablet  Commonly known as:  GLUCOTROL XL  Take 10 mg by mouth daily with breakfast.     hydrALAZINE 25 MG tablet  Commonly known as:  APRESOLINE  Take 1 tablet (25 mg total) by mouth every 6 (six) hours.     metoprolol tartrate 25 MG tablet  Commonly known as:  LOPRESSOR  Take 25 mg by mouth 2 (two) times daily.     NAMENDA XR 14 MG Cp24  Generic drug:  Memantine HCl ER  Take 14 mg by mouth every morning.     ondansetron 4 MG tablet  Commonly known as:  ZOFRAN  Take 1 tablet (4 mg total) by mouth every 6 (six) hours as needed for nausea.     psyllium 0.52 G capsule  Commonly known as:  REGULOID  Take 0.52 g by mouth every morning.     RESOURCE THICKENUP CLEAR Powd  Take 120 g by mouth as needed.           Follow-up Information   Follow up with Elie ConferWESTERMANN,CAROLA JO, MD. Schedule an appointment as  soon as possible for a visit in 2 weeks. (Follow up appt after recent hospitalization)    Specialty:  Family Medicine   Contact information:   [address] [place] [address] [phone]        The results of significant diagnostics from this hospitalization (including imaging, microbiology, ancillary and laboratory) are listed below for reference.    Significant Diagnostic Studies: Dg Chest 1 View  09/29/2013   CLINICAL DATA:  Unresponsive, fatigue.  EXAM: CHEST - 1 VIEW  COMPARISON:  Chest radiograph September 18, 2013  FINDINGS: Cardiac silhouette appears upper limits of normal, even with consideration to this low inspiratory portable examination with crowded vascular markings. Calcified aortic knob. Mild chronic interstitial changes, left lung base strandy densities. No pleural effusions. Biapical pleural thickening. No pneumothorax.  Subacute to chronic left humerus surgical neck fracture. Patient is osteopenic. Multiple EKG lines overlie the patient and may obscure subtle underlying pathology.  IMPRESSION: Borderline cardiomegaly, left lung base atelectasis and this low inspiratory portable examination.   Electronically Signed   By: Awilda Metroourtnay  Bloomer   On: 09/29/2013 13:21   Dg Chest 2 View  09/18/2013   CLINICAL DATA:  Syncope.  History of hypertension.  EXAM: CHEST  2 VIEW  COMPARISON:  02/11/2012  FINDINGS: Interstitial markings are thickened but stable. No lung consolidation or edema. No pleural effusion or pneumothorax.  Cardiac silhouette is normal in size. No mediastinal or hilar masses.  Bony thorax is demineralized. There is a severe mid thoracic compression fracture and a moderate compression fracture of the upper lumbar spine, both stable.  IMPRESSION: No acute cardiopulmonary disease.   Electronically Signed   By: Amie Portlandavid  Ormond M.D.   On: 09/18/2013 07:46   Ct Head Wo Contrast  09/29/2013   CLINICAL DATA:  Episode of unresponsiveness.  EXAM: CT HEAD WITHOUT CONTRAST   TECHNIQUE: Contiguous axial images were obtained from the base of the skull through the vertex without intravenous contrast.  COMPARISON:  09/18/2013.  FINDINGS: No skull fracture or intracranial hemorrhage.  Remote infarct right basal ganglia/ corona radiata with subsequent dilation right  lateral ventricle.  Prominent small vessel disease type changes.  Remote small infarct left basal ganglia.  No CT evidence of large acute infarct.  Atrophy without hydrocephalus.  No intracranial mass lesion noted on this unenhanced exam.  Polypoid opacification left frontal sinus.  IMPRESSION: Remote infarcts and small vessel disease type changes without CT evidence of large acute infarct.  No intracranial hemorrhage.  Atrophy without hydrocephalus.  Polypoid opacification left frontal sinus.   Electronically Signed   By: Bridgett Larsson M.D.   On: 09/29/2013 13:23   Ct Head Wo Contrast  09/18/2013   CLINICAL DATA:  Syncopal episode.  EXAM: CT HEAD WITHOUT CONTRAST  TECHNIQUE: Contiguous axial images were obtained from the base of the skull through the vertex without intravenous contrast.  COMPARISON:  CT, 02/20/2010.  MRI, 02/21/2010.  FINDINGS: Ventricles are normal in overall configuration. There is ex vacuo dilation of the right lateral ventricle from an old right basal ganglia to right centrum semiovale infarct. No evidence of hydrocephalus. Ventricular and sulcal enlargement reflects mild to moderate atrophy.  No parenchymal masses or mass effect. White matter hypoattenuation is noted diffusely consistent with moderate chronic microvascular ischemic change.  No evidence of a recent infarct.  No extra-axial masses or abnormal fluid collections.  No intracranial hemorrhage.  Visualized sinuses and mastoid air cells are clear. No skull lesion.  IMPRESSION: 1. No acute intracranial abnormalities. 2. Old infarct, atrophy and moderate chronic microvascular ischemic change.   Electronically Signed   By: Amie Portland M.D.   On:  09/18/2013 07:55   Mr Brain Wo Contrast  10/04/2013   CLINICAL DATA:  80 year old female unresponsive at nursing home. Hypothermia. Initial encounter.  EXAM: MRI HEAD WITHOUT CONTRAST  TECHNIQUE: Multiplanar, multiecho pulse sequences of the brain and surrounding structures were obtained without intravenous contrast.  COMPARISON:  Head CT 09/29/2013 and earlier.  Brain MRI 02/21/2010.  FINDINGS: Small nodular 8 mm focus of increased trace diffusion signal in the left basal ganglia (series 4, image 18). Isointense signal corresponding to this finding on the ADC map. Mild T2 and FLAIR hyperintensity. No associated mass effect or hemorrhage.  Chronic infarct with hemosiderin and cystic encephalomalacia in the right basal ganglia, was acute in 2011. Sequelae of Wallerian degeneration in the right brainstem. Major intracranial vascular flow voids are stable. Mild generalized cerebral volume loss elsewhere since 2011. Chronic Patchy and confluent cerebral white matter T2 and FLAIR hyperintensity not significantly changed. Cerebellum remains within normal limits for age. Visible internal auditory structures appear normal.  No midline shift, mass effect, evidence of mass lesion, ventriculomegaly, extra-axial collection or acute intracranial hemorrhage. Cervicomedullary junction and pituitary are within normal limits. Negative visualized cervical spine. Normal bone marrow signal. Trace left mastoid fluid. Visualized orbit soft tissues are within normal limits. Visualized scalp soft tissues are within normal limits.  IMPRESSION: 1. Subacute appearing lacunar infarct left basal ganglia. No mass effect or hemorrhage. 2. Evolution of the 2011 large right basal ganglia infarct, with encephalomalacia and Wallerian degeneration.   Electronically Signed   By: Augusto Gamble M.D.   On: 10/04/2013 12:24   Ct Abdomen Pelvis W Contrast  09/29/2013   CLINICAL DATA:  Patient unresponsive.  EXAM: CT ABDOMEN AND PELVIS WITH CONTRAST   TECHNIQUE: Multidetector CT imaging of the abdomen and pelvis was performed using the standard protocol following bolus administration of intravenous contrast.  CONTRAST:  OMNIPAQUE IOHEXOL 300 MG/ML  SOLN  COMPARISON:  CT chest 02/11/2012.  FINDINGS: Liver normal. Spleen normal.  Pancreas is unremarkable. No biliary distention. Gallbladder nondistended. Gallstones cannot be excluded. No pericholecystic fluid collection.  Adrenals normal. Punctate lucencies in both kidneys most consistent with simple cysts. No obstructing ureteral stone. Foley catheter in bladder. Air noted in the bladder, most likely from instrumentation. Urinary tract infection cannot be excluded. Uterus unremarkable. Tiny right ovarian simple cyst.  No significant adenopathy. Abdominal aorta normal in caliber. Aortoiliac atherosclerotic vascular disease. Visceral vessels are patent. No significant adenopathy.  Appendix normal. Extensive diverticulosis. Fat plane stranding noted adjacent to the left colon, images number 42 through 50/series 2. Findings consistent with diverticulitis. No abscess. No bowel obstruction. No free air. Stomach is nondistended. No mesenteric mass.  Heart size normal. Mild basilar atelectasis. Degenerative changes lumbar spine. Multiple lumbar compression fractures are present. These are mild. No prominent retropulsed fragments.  IMPRESSION: Mild changes of left colonic diverticulitis. No evidence of abscess.   Electronically Signed   By: Maisie Fus  Register   On: 09/29/2013 14:05    Microbiology: No results found for this or any previous visit (from the past 240 hour(s)).   Labs: Basic Metabolic Panel:  Recent Labs Lab 10/06/13 0535 10/07/13 0512 10/08/13 0548 10/09/13 0500 10/10/13 0525  NA 135* 137 134* 135* 138  K 4.1 3.9 3.6* 3.9 3.9  CL 100 102 100 102 103  CO2 23 22 23 22 22   GLUCOSE 120* 124* 138* 124* 129*  BUN 19 20 21 22 22   CREATININE 1.12* 1.11* 1.01 0.93 0.82  CALCIUM 9.1 9.2 8.8 8.9  9.3   Liver Function Tests: No results found for this basename: AST, ALT, ALKPHOS, BILITOT, PROT, ALBUMIN,  in the last 168 hours No results found for this basename: LIPASE, AMYLASE,  in the last 168 hours No results found for this basename: AMMONIA,  in the last 168 hours CBC:  Recent Labs Lab 10/04/13 0345  WBC 6.4  HGB 10.8*  HCT 31.5*  MCV 85.4  PLT 217   Cardiac Enzymes: No results found for this basename: CKTOTAL, CKMB, CKMBINDEX, TROPONINI,  in the last 168 hours BNP: BNP (last 3 results) No results found for this basename: PROBNP,  in the last 8760 hours CBG:  Recent Labs Lab 10/09/13 0802 10/09/13 1135 10/09/13 1612 10/09/13 2139 10/10/13 0722  GLUCAP 117* 169* 131* 133* 128*    Time coordinating discharge: Over 30 minutes

## 2013-10-22 ENCOUNTER — Inpatient Hospital Stay (HOSPITAL_COMMUNITY)
Admission: EM | Admit: 2013-10-22 | Discharge: 2013-10-25 | DRG: 871 | Disposition: A | Discharge status: Skilled Nursing Facility | Payer: Medicare Other | Attending: Internal Medicine | Admitting: Internal Medicine

## 2013-10-22 ENCOUNTER — Emergency Department (HOSPITAL_COMMUNITY): Payer: Medicare Other

## 2013-10-22 ENCOUNTER — Encounter (HOSPITAL_COMMUNITY): Payer: Self-pay | Admitting: Emergency Medicine

## 2013-10-22 DIAGNOSIS — N183 Chronic kidney disease, stage 3 unspecified: Secondary | ICD-10-CM | POA: Diagnosis present

## 2013-10-22 DIAGNOSIS — R404 Transient alteration of awareness: Secondary | ICD-10-CM

## 2013-10-22 DIAGNOSIS — N390 Urinary tract infection, site not specified: Secondary | ICD-10-CM | POA: Diagnosis present

## 2013-10-22 DIAGNOSIS — Z7982 Long term (current) use of aspirin: Secondary | ICD-10-CM

## 2013-10-22 DIAGNOSIS — G9340 Encephalopathy, unspecified: Secondary | ICD-10-CM

## 2013-10-22 DIAGNOSIS — Z8673 Personal history of transient ischemic attack (TIA), and cerebral infarction without residual deficits: Secondary | ICD-10-CM

## 2013-10-22 DIAGNOSIS — Z79899 Other long term (current) drug therapy: Secondary | ICD-10-CM

## 2013-10-22 DIAGNOSIS — I498 Other specified cardiac arrhythmias: Secondary | ICD-10-CM | POA: Diagnosis present

## 2013-10-22 DIAGNOSIS — G309 Alzheimer's disease, unspecified: Secondary | ICD-10-CM | POA: Diagnosis present

## 2013-10-22 DIAGNOSIS — F0280 Dementia in other diseases classified elsewhere without behavioral disturbance: Secondary | ICD-10-CM | POA: Diagnosis present

## 2013-10-22 DIAGNOSIS — B961 Klebsiella pneumoniae [K. pneumoniae] as the cause of diseases classified elsewhere: Secondary | ICD-10-CM | POA: Diagnosis present

## 2013-10-22 DIAGNOSIS — R4182 Altered mental status, unspecified: Secondary | ICD-10-CM | POA: Diagnosis present

## 2013-10-22 DIAGNOSIS — T68XXXA Hypothermia, initial encounter: Secondary | ICD-10-CM

## 2013-10-22 DIAGNOSIS — R001 Bradycardia, unspecified: Secondary | ICD-10-CM

## 2013-10-22 DIAGNOSIS — R55 Syncope and collapse: Secondary | ICD-10-CM | POA: Diagnosis present

## 2013-10-22 DIAGNOSIS — R4020 Unspecified coma: Secondary | ICD-10-CM

## 2013-10-22 DIAGNOSIS — E119 Type 2 diabetes mellitus without complications: Secondary | ICD-10-CM | POA: Diagnosis present

## 2013-10-22 DIAGNOSIS — A419 Sepsis, unspecified organism: Principal | ICD-10-CM

## 2013-10-22 DIAGNOSIS — Z66 Do not resuscitate: Secondary | ICD-10-CM | POA: Diagnosis present

## 2013-10-22 DIAGNOSIS — J189 Pneumonia, unspecified organism: Secondary | ICD-10-CM | POA: Diagnosis present

## 2013-10-22 DIAGNOSIS — E1169 Type 2 diabetes mellitus with other specified complication: Secondary | ICD-10-CM | POA: Diagnosis present

## 2013-10-22 DIAGNOSIS — I129 Hypertensive chronic kidney disease with stage 1 through stage 4 chronic kidney disease, or unspecified chronic kidney disease: Secondary | ICD-10-CM | POA: Diagnosis present

## 2013-10-22 DIAGNOSIS — F0390 Unspecified dementia without behavioral disturbance: Secondary | ICD-10-CM

## 2013-10-22 LAB — CBC
HEMATOCRIT: 32.8 % — AB (ref 36.0–46.0)
HEMOGLOBIN: 11 g/dL — AB (ref 12.0–15.0)
MCH: 28.5 pg (ref 26.0–34.0)
MCHC: 33.5 g/dL (ref 30.0–36.0)
MCV: 85 fL (ref 78.0–100.0)
Platelets: 206 10*3/uL (ref 150–400)
RBC: 3.86 MIL/uL — AB (ref 3.87–5.11)
RDW: 14.4 % (ref 11.5–15.5)
WBC: 4.6 10*3/uL (ref 4.0–10.5)

## 2013-10-22 LAB — URINALYSIS, ROUTINE W REFLEX MICROSCOPIC
BILIRUBIN URINE: NEGATIVE
Bilirubin Urine: NEGATIVE
GLUCOSE, UA: NEGATIVE mg/dL
Glucose, UA: NEGATIVE mg/dL
Hgb urine dipstick: NEGATIVE
Hgb urine dipstick: NEGATIVE
KETONES UR: NEGATIVE mg/dL
Ketones, ur: NEGATIVE mg/dL
NITRITE: NEGATIVE
Nitrite: NEGATIVE
PROTEIN: NEGATIVE mg/dL
Protein, ur: NEGATIVE mg/dL
Specific Gravity, Urine: 1.012 (ref 1.005–1.030)
Specific Gravity, Urine: 1.013 (ref 1.005–1.030)
UROBILINOGEN UA: 0.2 mg/dL (ref 0.0–1.0)
Urobilinogen, UA: 0.2 mg/dL (ref 0.0–1.0)
pH: 5.5 (ref 5.0–8.0)
pH: 6 (ref 5.0–8.0)

## 2013-10-22 LAB — GLUCOSE, CAPILLARY
GLUCOSE-CAPILLARY: 98 mg/dL (ref 70–99)
GLUCOSE-CAPILLARY: 98 mg/dL (ref 70–99)

## 2013-10-22 LAB — CBC WITH DIFFERENTIAL/PLATELET
Basophils Absolute: 0 10*3/uL (ref 0.0–0.1)
Basophils Relative: 1 % (ref 0–1)
EOS ABS: 0.1 10*3/uL (ref 0.0–0.7)
EOS PCT: 2 % (ref 0–5)
HCT: 32.3 % — ABNORMAL LOW (ref 36.0–46.0)
Hemoglobin: 10.9 g/dL — ABNORMAL LOW (ref 12.0–15.0)
LYMPHS ABS: 1.7 10*3/uL (ref 0.7–4.0)
Lymphocytes Relative: 33 % (ref 12–46)
MCH: 28.8 pg (ref 26.0–34.0)
MCHC: 33.7 g/dL (ref 30.0–36.0)
MCV: 85.4 fL (ref 78.0–100.0)
Monocytes Absolute: 0.4 10*3/uL (ref 0.1–1.0)
Monocytes Relative: 9 % (ref 3–12)
Neutro Abs: 2.8 10*3/uL (ref 1.7–7.7)
Neutrophils Relative %: 55 % (ref 43–77)
Platelets: 213 10*3/uL (ref 150–400)
RBC: 3.78 MIL/uL — AB (ref 3.87–5.11)
RDW: 14.4 % (ref 11.5–15.5)
WBC: 5 10*3/uL (ref 4.0–10.5)

## 2013-10-22 LAB — COMPREHENSIVE METABOLIC PANEL
ALT: 17 U/L (ref 0–35)
ANION GAP: 12 (ref 5–15)
AST: 23 U/L (ref 0–37)
Albumin: 2.8 g/dL — ABNORMAL LOW (ref 3.5–5.2)
Alkaline Phosphatase: 91 U/L (ref 39–117)
BUN: 21 mg/dL (ref 6–23)
CHLORIDE: 104 meq/L (ref 96–112)
CO2: 23 meq/L (ref 19–32)
CREATININE: 0.81 mg/dL (ref 0.50–1.10)
Calcium: 9.1 mg/dL (ref 8.4–10.5)
GFR calc non Af Amer: 63 mL/min — ABNORMAL LOW (ref >90)
GFR, EST AFRICAN AMERICAN: 73 mL/min — AB (ref >90)
GLUCOSE: 89 mg/dL (ref 70–99)
Potassium: 4 mEq/L (ref 3.7–5.3)
Sodium: 139 mEq/L (ref 137–147)
Total Protein: 6.1 g/dL (ref 6.0–8.3)

## 2013-10-22 LAB — CBG MONITORING, ED
GLUCOSE-CAPILLARY: 92 mg/dL (ref 70–99)
GLUCOSE-CAPILLARY: 84 mg/dL (ref 70–99)
Glucose-Capillary: 102 mg/dL — ABNORMAL HIGH (ref 70–99)

## 2013-10-22 LAB — URINE MICROSCOPIC-ADD ON

## 2013-10-22 LAB — MRSA PCR SCREENING: MRSA BY PCR: NEGATIVE

## 2013-10-22 LAB — I-STAT CG4 LACTIC ACID, ED: Lactic Acid, Venous: 0.72 mmol/L (ref 0.5–2.2)

## 2013-10-22 LAB — I-STAT TROPONIN, ED: Troponin i, poc: 0.03 ng/mL (ref 0.00–0.08)

## 2013-10-22 LAB — CORTISOL-AM, BLOOD: Cortisol - AM: 14.7 ug/dL (ref 4.3–22.4)

## 2013-10-22 LAB — TSH: TSH: 2.06 u[IU]/mL (ref 0.350–4.500)

## 2013-10-22 MED ORDER — ASPIRIN 81 MG PO CHEW
81.0000 mg | CHEWABLE_TABLET | Freq: Every morning | ORAL | Status: DC
  Administered 2013-10-24 – 2013-10-25 (×2): 81 mg via ORAL
  Filled 2013-10-22 (×2): qty 1

## 2013-10-22 MED ORDER — ONDANSETRON HCL 4 MG PO TABS: 4.0000 mg | ORAL_TABLET | Freq: Four times a day (QID) | ORAL | Status: DC | PRN

## 2013-10-22 MED ORDER — CETYLPYRIDINIUM CHLORIDE 0.05 % MT LIQD
7.0000 mL | Freq: Two times a day (BID) | OROMUCOSAL | Status: DC
  Administered 2013-10-22 – 2013-10-25 (×5): 7 mL via OROMUCOSAL

## 2013-10-22 MED ORDER — ACETAMINOPHEN 650 MG RE SUPP: 650.0000 mg | Freq: Four times a day (QID) | RECTAL | Status: DC | PRN

## 2013-10-22 MED ORDER — ENOXAPARIN SODIUM 40 MG/0.4ML ~~LOC~~ SOLN
40.0000 mg | SUBCUTANEOUS | Status: DC
  Administered 2013-10-22 – 2013-10-25 (×4): 40 mg via SUBCUTANEOUS
  Filled 2013-10-22 (×4): qty 0.4

## 2013-10-22 MED ORDER — CHOLECALCIFEROL 10 MCG (400 UNIT) PO TABS
400.0000 [IU] | ORAL_TABLET | Freq: Every morning | ORAL | Status: DC
  Administered 2013-10-24 – 2013-10-25 (×2): 400 [IU] via ORAL
  Filled 2013-10-22 (×3): qty 1

## 2013-10-22 MED ORDER — PIPERACILLIN-TAZOBACTAM 3.375 G IVPB
3.3750 g | Freq: Three times a day (TID) | INTRAVENOUS | Status: DC
  Administered 2013-10-22 – 2013-10-25 (×8): 3.375 g via INTRAVENOUS
  Filled 2013-10-22 (×11): qty 50

## 2013-10-22 MED ORDER — PIPERACILLIN-TAZOBACTAM 3.375 G IVPB 30 MIN
3.3750 g | Freq: Once | INTRAVENOUS | Status: AC
  Administered 2013-10-22: 3.375 g via INTRAVENOUS
  Filled 2013-10-22: qty 50

## 2013-10-22 MED ORDER — ONDANSETRON HCL 4 MG/2ML IJ SOLN: 4.0000 mg | Freq: Four times a day (QID) | INTRAMUSCULAR | Status: DC | PRN

## 2013-10-22 MED ORDER — ACETAMINOPHEN 500 MG PO TABS: 1000.0000 mg | ORAL_TABLET | Freq: Four times a day (QID) | ORAL | Status: DC | PRN

## 2013-10-22 MED ORDER — VANCOMYCIN HCL 10 G IV SOLR
1250.0000 mg | INTRAVENOUS | Status: DC
  Administered 2013-10-22 – 2013-10-24 (×3): 1250 mg via INTRAVENOUS
  Filled 2013-10-22 (×4): qty 1250

## 2013-10-22 MED ORDER — ACETAMINOPHEN 325 MG PO TABS: 650.0000 mg | ORAL_TABLET | Freq: Four times a day (QID) | ORAL | Status: DC | PRN

## 2013-10-22 MED ORDER — DEXTROSE-NACL 5-0.9 % IV SOLN
INTRAVENOUS | Status: DC
  Administered 2013-10-22: 15:00:00 via INTRAVENOUS
  Administered 2013-10-23: 1000 mL via INTRAVENOUS

## 2013-10-22 NOTE — ED Notes (Signed)
Lab results given to [place], [place].

## 2013-10-22 NOTE — H&P (Addendum)
[facility] History and Physical  CHERYL CHAY ZOX:[id number] DOB: [birth year] DOA: 10/22/2013  Referring physician: Dr Radford Pax PCP: Elie Confer, MD   Chief Complaint: AMS, unresponsive.   HPI: Desiree Gonzalez is a 80 y.o. female recently admitted 7-30 for similar presentation, unresponsiveness, syncope, hypothermia, diverticulitis, encephalopathy, sub- acute infarct, prior history of UTI/Sepsis ( 7/23), Dementia, Stage III CKD, Diabetes, who was found unresponsive at SNF. Per EMS patient was bradycardic, unclear how low was HR, also Blood sugar was low at 68. Patient is not able to provide significant history due to demential. She is alert, slow respond time, she was able to tell me her name. She denies any pain, no chest pain, dyspnea. She relates diarrhea, I don't know how accurate this information is.  In the ED patient was found to be hypothermic T at 94, Chest x ray with new density left lung, ? Atelectasis.   Review of Systems:    Past Medical History  Diagnosis Date  . CVA (cerebral infarction)   . Alzheimer's dementia   . GERD (gastroesophageal reflux disease)   . Hypertension   . Osteopenia   . Diabetes mellitus without complication   . Stage III chronic kidney disease 10/02/2013   History reviewed. No pertinent past surgical history. Social History:  reports that she has never smoked. She has never used smokeless tobacco. She reports that she does not drink alcohol or use illicit drugs.  No Known Allergies  Family History: Patient unable to provide any details.   Prior to Admission medications   Medication Sig Start Date End Date Taking? Authorizing Provider  acetaminophen (TYLENOL) 500 MG tablet Take 1,000 mg by mouth every 6 (six) hours as needed for moderate pain.    Yes Historical Provider, MD  antiseptic oral rinse (CPC / CETYLPYRIDINIUM CHLORIDE 0.05%) 0.05 % LIQD solution 7 mLs by Mouth Rinse route 2 (two) times daily. 10/10/13  Yes Alison Murray,  MD  aspirin 81 MG chewable tablet Chew 81 mg by mouth every morning.    Yes Historical Provider, MD  cholecalciferol (VITAMIN D) 400 UNITS TABS tablet Take 400 Units by mouth every morning.   Yes Historical Provider, MD  donepezil (ARICEPT) 10 MG tablet Take 10 mg by mouth at bedtime.    Yes Historical Provider, MD  glipiZIDE (GLUCOTROL XL) 10 MG 24 hr tablet Take 10 mg by mouth daily with breakfast.   Yes Historical Provider, MD  hydrALAZINE (APRESOLINE) 25 MG tablet Take 1 tablet (25 mg total) by mouth every 6 (six) hours. 10/10/13  Yes Alison Murray, MD  Memantine HCl ER (NAMENDA XR) 14 MG CP24 Take 14 mg by mouth every morning.    Yes Historical Provider, MD  metoprolol tartrate (LOPRESSOR) 25 MG tablet Take 25 mg by mouth 2 (two) times daily.   Yes Historical Provider, MD  ondansetron (ZOFRAN) 4 MG tablet Take 1 tablet (4 mg total) by mouth every 6 (six) hours as needed for nausea. 10/10/13  Yes Alison Murray, MD  psyllium (REGULOID) 0.52 G capsule Take 0.52 g by mouth every morning.    Yes Historical Provider, MD   Physical Exam: Filed Vitals:   10/22/13 1330 10/22/13 1400 10/22/13 1405 10/22/13 1446  BP: 112/51 91/42  114/49  Pulse: 77 82  86  Temp:   99 F (37.2 C) 98.8 F (37.1 C)  TempSrc:   Core (Comment) Axillary  Resp: 12 15  16   Height:    5\' 7"  (1.702 m)  Weight:    87.2 kg (192 lb 3.9 oz)  SpO2: 98% 95%  97%    Wt Readings from Last 3 Encounters:  10/22/13 87.2 kg (192 lb 3.9 oz)  10/06/13 91.3 kg (201 lb 4.5 oz)  09/19/13 93.2 kg (205 lb 7.5 oz)    General:  Appears calm and comfortable Eyes: PERRL, normal lids, irises & conjunctiva ENT: grossly normal hearing, lips & tongue Neck: no LAD, masses or thyromegaly Cardiovascular: RRR, no m/r/g. No LE edema. Telemetry: SR, no arrhythmias  Respiratory: CTA bilaterally, no w/r/r. Normal respiratory effort. Abdomen: soft, ntnd Skin: no rash or induration seen on limited exam Musculoskeletal: grossly normal tone  BUE/BLE Psychiatric: grossly normal mood and affect, speech fluent and appropriate Neurologic: grossly non-focal.          Labs on Admission:  Basic Metabolic Panel:  Recent Labs Lab 10/22/13 0551  NA 139  K 4.0  CL 104  CO2 23  GLUCOSE 89  BUN 21  CREATININE 0.81  CALCIUM 9.1   Liver Function Tests:  Recent Labs Lab 10/22/13 0551  AST 23  ALT 17  ALKPHOS 91  BILITOT <0.2*  PROT 6.1  ALBUMIN 2.8*   No results found for this basename: LIPASE, AMYLASE,  in the last 168 hours No results found for this basename: AMMONIA,  in the last 168 hours CBC:  Recent Labs Lab 10/22/13 0551 10/22/13 0839  WBC 4.6 5.0  NEUTROABS  --  2.8  HGB 11.0* 10.9*  HCT 32.8* 32.3*  MCV 85.0 85.4  PLT 206 213   Cardiac Enzymes: No results found for this basename: CKTOTAL, CKMB, CKMBINDEX, TROPONINI,  in the last 168 hours  BNP (last 3 results) No results found for this basename: PROBNP,  in the last 8760 hours CBG:  Recent Labs Lab 10/22/13 0353 10/22/13 0837 10/22/13 1040  GLUCAP 102* 92 84    Radiological Exams on Admission: Dg Chest 1 View  10/22/2013   CLINICAL DATA:  Loss of consciousness.  EXAM: CHEST - 1 VIEW  COMPARISON:  September 29, 2013.  FINDINGS: Stable cardiomediastinal silhouette. No pneumothorax or pleural effusion is noted. Stable interstitial densities are noted throughout both lungs which are most consistent with chronic scarring. New ill-defined linear density is noted laterally in left lung base consistent with subsegmental atelectasis. Old proximal left humeral fracture is again noted. Old right clavicular fracture is noted.  IMPRESSION: Stable interstitial densities throughout both lungs most consistent with scarring. New ill-defined density seen laterally in left lung base most consistent with subsegmental atelectasis.   Electronically Signed   By: Roque Lias M.D.   On: 10/22/2013 09:39   Ct Head Wo Contrast  10/22/2013   CLINICAL DATA:  Loss of  consciousness.  EXAM: CT HEAD WITHOUT CONTRAST  TECHNIQUE: Contiguous axial images were obtained from the base of the skull through the vertex without intravenous contrast.  COMPARISON:  CT scan of September 29, 2013; MRI scan of October 04, 2013.  FINDINGS: Bony calvarium appears intact. Diffuse cortical atrophy is noted. Chronic ischemic white matter disease is noted. Stable old infarction of right basal ganglia is noted. No mass effect or midline shift is noted. Ventricular size is within normal limits. There is no evidence of mass lesion, hemorrhage or acute infarction.  IMPRESSION: Diffuse cortical atrophy. Chronic ischemic white matter disease. 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 No acute intracranial abnormality seen.   Electronically Signed   By: Roque Lias M.D.   On: 10/22/2013 09:25  EKG: Independently reviewed. Bradycardia. Prolong PR.   Assessment/Plan Active Problems:   Dementia   DM (diabetes mellitus)   DNR (do not resuscitate)   Syncope   Hypothermia   LOC (loss of consciousness)  1-Syncope: unclear if related to hypoglycemia, hypothermia, bradycardia. Patient alert. Monitor on telemetry. Correct hypothermia, hypoglycemia.  Repeat EKG.   2-Sepsis, Hypothermia, hypotension: Could be related to infectious process, will treat for PNA. Check TSH, cortisol level. correct hypoglycemia. Follow up urine culture.   3-PNA; Patient with hypothermia, chest x ray with density left lung, recently discharge from hospital. Will treat with Vancomycin and zosyn. Speech therapy evaluation.   4-Bradycardia: Correct hypoglycemia, hypothermia, hold betablocker, donepezil, and Namenda.   5-Hypoglycemia: Hold glipizide, D 5 IV fluids.   6-Diabetes: hold glipizide. Patient presents with hypoglycemia.    Code Status: DNR DVT Prophylaxis: Lovenox.  Family Communication: Care discussed with daughter Ms Octavio MannsMcCraw Letha Disposition Plan: Expect more than 3 to 4 days inpatient.   Time spent: 75  minutes.   Hartley Barefootegalado, Clarence Cogswell A [facility] Pager [phone]  **Disclaimer: This note may have been dictated with voice recognition software. Similar sounding words can inadvertently be transcribed and this note may contain transcription errors which may not have been corrected upon publication of note.**

## 2013-10-22 NOTE — ED Notes (Signed)
Pt sleeping and snoring. Saturations dropping to 88-89. Placed on oxygen at 2 liters via [place]. sats increase to 97 %

## 2013-10-22 NOTE — ED Notes (Signed)
Rectal Temp 94.0 reported to PA. Harris. PT was given warm blankets

## 2013-10-22 NOTE — ED Provider Notes (Signed)
CSN: [id number]     Arrival date & time 10/22/13  0145 History   First MD Initiated Contact with Patient 10/22/13 [phone]     Chief Complaint  Patient presents with  . Loss of Consciousness     (Consider location/radiation/quality/duration/timing/severity/associated sxs/prior Treatment) HPI Comments: Desiree Gonzalez is a(n) 80 y.o. female who presents EMS for apparent syncopal episode. She has a history of dementia and is normally only oriented to person and place. Per nursing notes the patient had a witnessed syncopal event and was found to be bradycardic. Unsure of the length of time of her loss of consciousness. The patient does not appear to be at baseline mental status. She is unable to provide a history due to dementia. Her last admission to the hospital and was approximately one month ago for altered mental status and diverticulitis.   Patient is a 80 y.o. female presenting with syncope. The history is provided by medical records and the patient. The history is limited by the condition of the patient and the absence of a caregiver. No language interpreter was used.  Loss of Consciousness Episode history:  Single Most recent episode:  Today Duration: uknown. Progression:  Resolved Chronicity:  New Context: sitting down   Witnessed: yes   Ineffective treatments:  None tried Associated symptoms: no difficulty breathing and no recent fall   Associated symptoms comment:  Apparent bradycardia    Past Medical History  Diagnosis Date  . CVA (cerebral infarction)   . Alzheimer's dementia   . GERD (gastroesophageal reflux disease)   . Hypertension   . Osteopenia   . Diabetes mellitus without complication   . Stage III chronic kidney disease 10/02/2013   History reviewed. No pertinent past surgical history. History reviewed. No pertinent family history. History  Substance Use Topics  . Smoking status: Never Smoker   . Smokeless tobacco: Never Used  . Alcohol Use: No   OB History    Grav Para Term Preterm Abortions TAB SAB Ect Mult Living                 Review of Systems  Unable to perform ROS: Dementia  Cardiovascular: Positive for syncope.      Allergies  Review of patient's allergies indicates no known allergies.  Home Medications   Prior to Admission medications   Medication Sig Start Date End Date Taking? Authorizing Provider  acetaminophen (TYLENOL) 500 MG tablet Take 1,000 mg by mouth every 6 (six) hours as needed for moderate pain.     Historical Provider, MD  antiseptic oral rinse (CPC / CETYLPYRIDINIUM CHLORIDE 0.05%) 0.05 % LIQD solution 7 mLs by Mouth Rinse route 2 (two) times daily. 10/10/13   Alison Murray, MD  aspirin 81 MG chewable tablet Chew 81 mg by mouth every morning.     Historical Provider, MD  cholecalciferol (VITAMIN D) 400 UNITS TABS tablet Take 400 Units by mouth every morning.    Historical Provider, MD  donepezil (ARICEPT) 10 MG tablet Take 10 mg by mouth at bedtime.     Historical Provider, MD  feeding supplement, ENSURE COMPLETE, (ENSURE COMPLETE) LIQD Take 237 mLs by mouth 2 (two) times daily between meals. 10/10/13   Alison Murray, MD  fluconazole (DIFLUCAN) 100 MG tablet Take 1 tablet (100 mg total) by mouth daily. 10/10/13   Alison Murray, MD  glipiZIDE (GLUCOTROL XL) 10 MG 24 hr tablet Take 10 mg by mouth daily with breakfast.    Historical Provider, MD  hydrALAZINE (  APRESOLINE) 25 MG tablet Take 1 tablet (25 mg total) by mouth every 6 (six) hours. 10/10/13   Alison Murray, MD  Maltodextrin-Xanthan Gum (RESOURCE THICKENUP CLEAR) POWD Take 120 g by mouth as needed. 10/10/13   Alison Murray, MD  Memantine HCl ER (NAMENDA XR) 14 MG CP24 Take 14 mg by mouth every morning.     Historical Provider, MD  metoprolol tartrate (LOPRESSOR) 25 MG tablet Take 25 mg by mouth 2 (two) times daily.    Historical Provider, MD  miconazole (BAZA ANTIFUNGAL) 2 % cream Apply 1 application topically as needed (incontinent episodes).    Historical  Provider, MD  ondansetron (ZOFRAN) 4 MG tablet Take 1 tablet (4 mg total) by mouth every 6 (six) hours as needed for nausea. 10/10/13   Alison Murray, MD  psyllium (REGULOID) 0.52 G capsule Take 0.52 g by mouth every morning.     Historical Provider, MD   BP 150/66  Pulse 64  Temp(Src) 97.7 F (36.5 C) (Oral)  Resp 18  SpO2 98% Physical Exam  Nursing note and vitals reviewed. Constitutional: She appears well-developed and well-nourished.  HENT:  Head: Normocephalic and atraumatic.  Eyes: Conjunctivae are normal. Pupils are equal, round, and reactive to light.  Neck: Normal range of motion. No JVD present.  Cardiovascular: Normal rate, regular rhythm and normal heart sounds.   No murmur heard. 1+ pitting edema, symmetric to mid calf  Pulmonary/Chest: Effort normal and breath sounds normal. No respiratory distress.  Abdominal: Soft. Bowel sounds are normal. She exhibits no distension. There is tenderness (mild diffuse tenderness). There is no guarding.  Musculoskeletal: Normal range of motion.  Neurological: She is alert.  Oriented to name only  Skin: No rash noted.    ED Course  Procedures (including critical care time) Labs Review Labs Reviewed  CBC - Abnormal; Notable for the following:    RBC 3.86 (*)    Hemoglobin 11.0 (*)    HCT 32.8 (*)    All other components within normal limits  COMPREHENSIVE METABOLIC PANEL - Abnormal; Notable for the following:    Albumin 2.8 (*)    Total Bilirubin <0.2 (*)    GFR calc non Af Amer 63 (*)    GFR calc Af Amer 73 (*)    All other components within normal limits  CBG MONITORING, ED - Abnormal; Notable for the following:    Glucose-Capillary 102 (*)    All other components within normal limits  URINALYSIS, ROUTINE W REFLEX MICROSCOPIC  I-STAT TROPOININ, ED    Imaging Review No results found.   EKG Interpretation None        Date: 10/22/2013  Rate: 59  Rhythm: normal sinus rhythm  QRS Axis: normal  Intervals: PR  prolonged  ST/T Wave abnormalities: nonspecific ST changes  Conduction Disutrbances:incomplete LBBB  Narrative Interpretation:   Old EKG Reviewed: unchanged   MDM   Final diagnoses:  LOC (loss of consciousness)  Bradycardia  Hypothermia, initial encounter    Patient with delayed assessment as exiting providers was involved in managing several significant traumas. Patient found to have a rectal temp of 94.  Appears to have baseline mental status and is arousable and oriented to self. Patient seen in shared visit with attending physician. EKG is unchanged. CT head, labs, cxr peding.     ct without acute abnormality.  Neuro exam difficult to assess as patient does not follow commands well.  CXR with new areas of atelectasis Stable HGB and no leukocytosis.  UA shows small leukocytes but otherwise unremarkable.  patient will be admitted for syncope/bradycardia/ Temp foley placed      Arthor CaptainAbigail Gwendolyn Mclees, PA-C 10/25/13 [phone]

## 2013-10-22 NOTE — ED Notes (Signed)
Lab at the bedside 

## 2013-10-22 NOTE — ED Notes (Signed)
Provider aware of core temp. Warm blankets placed to pt

## 2013-10-22 NOTE — ED Notes (Signed)
Admitting MD at the bedside.  

## 2013-10-22 NOTE — Progress Notes (Signed)
ANTIBIOTIC CONSULT NOTE - INITIAL  Pharmacy Consult for Vancomycin and Zosyn Indication: pneumonia  No Known Allergies  Patient Measurements:   Wt Readings from Last 3 Encounters:  10/06/13 201 lb 4.5 oz (91.3 kg)  09/19/13 205 lb 7.5 oz (93.2 kg)  02/11/12 190 lb 0.6 oz (86.2 kg)    Vital Signs: Temp: 94 F (34.4 C) (08/22 0946) Temp src: Rectal (08/22 0946) BP: 147/67 mmHg (08/22 0946) Pulse Rate: 62 (08/22 0830) Intake/Output from previous day:   Intake/Output from this shift:    Labs:  Recent Labs  10/22/13 0551 10/22/13 0839  WBC 4.6 5.0  HGB 11.0* 10.9*  PLT 206 213  CREATININE 0.81  --    The CrCl is unknown because both a height and weight (above a minimum accepted value) are required for this calculation. No results found for this basename: VANCOTROUGH, VANCOPEAK, VANCORANDOM, GENTTROUGH, GENTPEAK, GENTRANDOM, TOBRATROUGH, TOBRAPEAK, TOBRARND, AMIKACINPEAK, AMIKACINTROU, AMIKACIN,  in the last 72 hours   Microbiology: Recent Results (from the past 720 hour(s))  CULTURE, BLOOD (ROUTINE X 2)     Status: None   Collection Time    09/29/13 12:29 PM      Result Value Ref Range Status   Specimen Description BLOOD LEFT HAND   Final   Special Requests BOTTLES DRAWN AEROBIC ONLY 2CC   Final   Culture  Setup Time     Final   Value: 09/29/2013 14:07     Performed at [facility]   Culture     Final   Value: NO GROWTH 5 DAYS     Performed at [facility]   Report Status 10/05/2013 FINAL   Final  CULTURE, BLOOD (ROUTINE X 2)     Status: None   Collection Time    09/29/13 12:32 PM      Result Value Ref Range Status   Specimen Description BLOOD RIGHT ARM   Final   Special Requests BOTTLES DRAWN AEROBIC AND ANAEROBIC [facility] EACH   Final   Culture  Setup Time     Final   Value: 09/29/2013 14:07     Performed at [facility]   Culture     Final   Value: NO GROWTH 5 DAYS     Performed at [facility]   Report Status 10/05/2013  FINAL   Final  URINE CULTURE     Status: None   Collection Time    09/29/13  1:39 PM      Result Value Ref Range Status   Specimen Description URINE, CATHETERIZED   Final   Special Requests NONE   Final   Culture  Setup Time     Final   Value: 09/29/2013 17:23     Performed at [facility] Count     Final   Value: NO GROWTH     Performed at [facility]   Culture     Final   Value: NO GROWTH     Performed at [facility]   Report Status 09/30/2013 FINAL   Final    Medical History: Past Medical History  Diagnosis Date  . CVA (cerebral infarction)   . Alzheimer's dementia   . GERD (gastroesophageal reflux disease)   . Hypertension   . Osteopenia   . Diabetes mellitus without complication   . Stage III chronic kidney disease 10/02/2013    Medications:  Anti-infectives   None     Assessment: 80 year old female presenting  with syncope and hypothermia to begin antibiotic therapy with Vancomycin and Zosyn for HCAP.  She has CKD and her creatinine appears close to her baseline.  Goal of Therapy:  Vancomycin trough level 15-20 mcg/ml  Plan:  Vancomycin 1250mg  IV q24h Zosyn 3.375gm IV q8h extended infusion Monitor renal function Follow available micro data  Estella HuskMichelle Elenna Spratling, Pharm.D., BCPS, AAHIVP Clinical Pharmacist Phone: [phone] or [phone] 10/22/2013, 10:53 AM

## 2013-10-22 NOTE — ED Notes (Signed)
Per EMS: pt coming from [facility] with c/o syncopal episode. Staff was making rounds on pt when pt had a witness syncopal episode. Staff reported to EMS pt was bradycardic, pupils dilated, and unresponsive. Upon EMS arrival pt was alert to name only, PEERLA, CBG 68, pt given 12.5 g dextrose, last cbg 205. Pt's baseline is alert to name and location.

## 2013-10-23 DIAGNOSIS — G9340 Encephalopathy, unspecified: Secondary | ICD-10-CM

## 2013-10-23 LAB — GLUCOSE, CAPILLARY
GLUCOSE-CAPILLARY: 151 mg/dL — AB (ref 70–99)
GLUCOSE-CAPILLARY: 133 mg/dL — AB (ref 70–99)
GLUCOSE-CAPILLARY: 187 mg/dL — AB (ref 70–99)
Glucose-Capillary: 119 mg/dL — ABNORMAL HIGH (ref 70–99)
Glucose-Capillary: 140 mg/dL — ABNORMAL HIGH (ref 70–99)
Glucose-Capillary: 149 mg/dL — ABNORMAL HIGH (ref 70–99)

## 2013-10-23 LAB — CBC
HCT: 33.4 % — ABNORMAL LOW (ref 36.0–46.0)
HEMOGLOBIN: 11.2 g/dL — AB (ref 12.0–15.0)
MCH: 28.8 pg (ref 26.0–34.0)
MCHC: 33.5 g/dL (ref 30.0–36.0)
MCV: 85.9 fL (ref 78.0–100.0)
PLATELETS: 223 10*3/uL (ref 150–400)
RBC: 3.89 MIL/uL (ref 3.87–5.11)
RDW: 14.5 % (ref 11.5–15.5)
WBC: 5.2 10*3/uL (ref 4.0–10.5)

## 2013-10-23 LAB — BASIC METABOLIC PANEL
ANION GAP: 11 (ref 5–15)
BUN: 18 mg/dL (ref 6–23)
CO2: 25 mEq/L (ref 19–32)
Calcium: 8.9 mg/dL (ref 8.4–10.5)
Chloride: 106 mEq/L (ref 96–112)
Creatinine, Ser: 1.01 mg/dL (ref 0.50–1.10)
GFR calc non Af Amer: 48 mL/min — ABNORMAL LOW (ref >90)
GFR, EST AFRICAN AMERICAN: 56 mL/min — AB (ref >90)
Glucose, Bld: 133 mg/dL — ABNORMAL HIGH (ref 70–99)
POTASSIUM: 3.7 meq/L (ref 3.7–5.3)
Sodium: 142 mEq/L (ref 137–147)

## 2013-10-23 NOTE — Evaluation (Signed)
Clinical/Bedside Swallow Evaluation Patient Details  Name: Desiree Gonzalez MRN: [id number] Date of Birth: [birth year]  Today's Date: 10/23/2013 Time: 1510-1531 SLP Time Calculation (min): 21 min  Past Medical History:  Past Medical History  Diagnosis Date  . CVA (cerebral infarction)   . Alzheimer's dementia   . GERD (gastroesophageal reflux disease)   . Hypertension   . Osteopenia   . Diabetes mellitus without complication   . Stage III chronic kidney disease 10/02/2013   Past Surgical History: History reviewed. No pertinent past surgical history. HPI:  Desiree Gonzalez is an 80 y.o. female recently admitted 7-30 for similar presentation, unresponsiveness, syncope, hypothermia, diverticulitis, encephalopathy, sub- acute infarct, prior history of UTI/Sepsis ( 7/23), Dementia, Stage III CKD, Diabetes, who was found unresponsive at SNF. In the ED patient was found to be hypothermic (T at 94), and Chest x ray showed new density left lung, ? Atelectasis.    Assessment / Plan / Recommendation Clinical Impression  Pt demonstrates signs of a primarily cognitive-based dysphagia with oral holding and decreased bolus awareness, which she compensated for with Max visual cues from SLP. Despite oral phase deficits and suspected mild delay in swallow initiation, pt did not show overt signs of airway compromise throughout challenging. Recommend to initiate Dys 2 textures and thin liquids with full supervision. Per RN and MD, patient has had fluctuating alertness throughout the day - patient must be FULLY alert and engaged for any PO consumption. SLP to follow for tolerance and advancement.    Aspiration Risk  Moderate    Diet Recommendation Dysphagia 2 (Fine chop);Thin liquid   Liquid Administration via: Straw;Cup Medication Administration: Crushed with puree Supervision: Staff to assist with self feeding;Full supervision/cueing for compensatory strategies Compensations: Slow rate;Small sips/bites  (watch for oral holding/residuals) Postural Changes and/or Swallow Maneuvers: Upright 30-60 min after meal;Seated upright 90 degrees    Other  Recommendations Oral Care Recommendations: Oral care BID   Follow Up Recommendations  Skilled Nursing facility    Frequency and Duration min 2x/week  2 weeks   Pertinent Vitals/Pain n/a    SLP Swallow Goals     Swallow Study Prior Functional Status       General Date of Onset: 10/22/13 HPI: Desiree Gonzalez is an 80 y.o. female recently admitted 7-30 for similar presentation, unresponsiveness, syncope, hypothermia, diverticulitis, encephalopathy, sub- acute infarct, prior history of UTI/Sepsis ( 7/23), Dementia, Stage III CKD, Diabetes, who was found unresponsive at SNF. In the ED patient was found to be hypothermic (T at 94), and Chest x ray showed new density left lung, ? Atelectasis.  Type of Study: Bedside swallow evaluation Previous Swallow Assessment: BSE 8/1 recommending Dys 1/nectar Diet Prior to this Study: NPO Temperature Spikes Noted: No Respiratory Status: Nasal cannula History of Recent Intubation: No Behavior/Cognition: Alert;Pleasant mood;Cooperative;Confused;Requires cueing Oral Cavity - Dentition: Adequate natural dentition Self-Feeding Abilities: Needs assist Patient Positioning: Upright in bed Baseline Vocal Quality: Low vocal intensity    Oral/Motor/Sensory Function     Ice Chips Ice chips: Impaired Presentation: Spoon Oral Phase Impairments: Poor awareness of bolus   Thin Liquid Thin Liquid: Impaired Presentation: Cup;Straw;Self Fed Oral Phase Functional Implications: Oral holding Pharyngeal  Phase Impairments: Suspected delayed Swallow    Nectar Thick Nectar Thick Liquid: Not tested   Honey Thick Honey Thick Liquid: Not tested   Puree Puree: Impaired Presentation: Spoon Oral Phase Functional Implications: Oral holding Pharyngeal Phase Impairments: Suspected delayed Swallow   Solid   GO    Solid:  Impaired  Presentation: Spoon Oral Phase Impairments: Poor awareness of bolus;Impaired anterior to posterior transit Pharyngeal Phase Impairments: Suspected delayed Swallow        Maxcine Ham, M.A. CCC-SLP [phone]  Maxcine Ham 10/23/2013,3:41 PM

## 2013-10-23 NOTE — Progress Notes (Signed)
[facility] PROGRESS NOTE  Desiree Gonzalez ZOX:[id number] DOB: Dec 18, 1923 DOA: 10/22/2013 PCP: Elie Confer, MD  Assessment/Plan: 1-Syncope: unclear if related to hypoglycemia, hypothermia, bradycardia.  Monitor on telemetry. Correct hypothermia, hypoglycemia.  Repeat EKG with improved bradycardia and PR interval.   2-Sepsis, Hypothermia, hypotension: Could be related to infectious process, will treat for PNA. TSH normal,  cortisol level at 14. correct hypoglycemia. Follow up urine culture. Continue with Vancomycin and Zosyn day 2.   3-PNA; Patient with hypothermia, chest x ray with density left lung, recently discharge from hospital. Continue Vancomycin and zosyn. Speech therapy evaluation.   4-Bradycardia: Correct hypoglycemia, hypothermia, hold betablocker, donepezil, and Namenda.   5-Hypoglycemia: Hold glipizide, D 5 IV fluids.   6-Diabetes: hold glipizide. Patient presents with hypoglycemia.   7-Encephalopathy: unclear if this is patient baseline. Treat for infection. Might need MRI if no improvement.   Code Status: DNR Family Communication: daughter updated on 8-22. Waiting for daughter to discussed goals of care.  Disposition Plan: might be able to be transfer later today.    Consultants:    Procedures:  none  Antibiotics:  Vancomycin 8-22  Zosyn 8-22  HPI/Subjective: Patient was difficult to wake up. She open her eyes. She said a few words. When ask if she wants to eat ? She said later.    Objective: Filed Vitals:   10/23/13 0316  BP: 119/92  Pulse: 82  Temp: 97.6 F (36.4 C)  Resp: 15    Intake/Output Summary (Last 24 hours) at 10/23/13 0711 Last data filed at 10/23/13 0600  Gross per 24 hour  Intake    875 ml  Output    525 ml  Net    350 ml   Filed Weights   10/22/13 1446 10/23/13 0329  Weight: 87.2 kg (192 lb 3.9 oz) 87 kg (191 lb 12.8 oz)    Exam:   General:  Sleepy, moves upper extremities at times.   Cardiovascular: S  1, S 2 RRR  Respiratory: Diminished breath sounds.   Abdomen: BS present, soft, NT  Musculoskeletal: trace edema.   Neuro: hard to wake up, open eyes, moves upper extremities, said few words.   Data Reviewed: Basic Metabolic Panel:  Recent Labs Lab 10/22/13 0551 10/23/13 0245  NA 139 142  K 4.0 3.7  CL 104 106  CO2 23 25  GLUCOSE 89 133*  BUN 21 18  CREATININE 0.81 1.01  CALCIUM 9.1 8.9   Liver Function Tests:  Recent Labs Lab 10/22/13 0551  AST 23  ALT 17  ALKPHOS 91  BILITOT <0.2*  PROT 6.1  ALBUMIN 2.8*   No results found for this basename: LIPASE, AMYLASE,  in the last 168 hours No results found for this basename: AMMONIA,  in the last 168 hours CBC:  Recent Labs Lab 10/22/13 0551 10/22/13 0839 10/23/13 0245  WBC 4.6 5.0 5.2  NEUTROABS  --  2.8  --   HGB 11.0* 10.9* 11.2*  HCT 32.8* 32.3* 33.4*  MCV 85.0 85.4 85.9  PLT 206 213 223   Cardiac Enzymes: No results found for this basename: CKTOTAL, CKMB, CKMBINDEX, TROPONINI,  in the last 168 hours BNP (last 3 results) No results found for this basename: PROBNP,  in the last 8760 hours CBG:  Recent Labs Lab 10/22/13 1040 10/22/13 1607 10/22/13 1957 10/22/13 2315 10/23/13 0329  GLUCAP 84 98 98 119* 140*    Recent Results (from the past 240 hour(s))  MRSA PCR SCREENING     Status: None  Collection Time    10/22/13  2:50 PM      Result Value Ref Range Status   MRSA by PCR NEGATIVE  NEGATIVE Final   Comment:            The GeneXpert MRSA Assay (FDA     approved for NASAL specimens     only), is one component of a     comprehensive MRSA colonization     surveillance program. It is not     intended to diagnose MRSA     infection nor to guide or     monitor treatment for     MRSA infections.     Studies: Dg Chest 1 View  10/22/2013   CLINICAL DATA:  Loss of consciousness.  EXAM: CHEST - 1 VIEW  COMPARISON:  September 29, 2013.  FINDINGS: Stable cardiomediastinal silhouette. No pneumothorax  or pleural effusion is noted. Stable interstitial densities are noted throughout both lungs which are most consistent with chronic scarring. New ill-defined linear density is noted laterally in left lung base consistent with subsegmental atelectasis. Old proximal left humeral fracture is again noted. Old right clavicular fracture is noted.  IMPRESSION: Stable interstitial densities throughout both lungs most consistent with scarring. New ill-defined density seen laterally in left lung base most consistent with subsegmental atelectasis.   Electronically Signed   By: Roque Lias M.D.   On: 10/22/2013 09:39   Ct Head Wo Contrast  10/22/2013   CLINICAL DATA:  Loss of consciousness.  EXAM: CT HEAD WITHOUT CONTRAST  TECHNIQUE: Contiguous axial images were obtained from the base of the skull through the vertex without intravenous contrast.  COMPARISON:  CT scan of September 29, 2013; MRI scan of October 04, 2013.  FINDINGS: Bony calvarium appears intact. Diffuse cortical atrophy is noted. Chronic ischemic white matter disease is noted. Stable old infarction of right basal ganglia is noted. No mass effect or midline shift is noted. Ventricular size is within normal limits. There is no evidence of mass lesion, hemorrhage or acute infarction.  IMPRESSION: Diffuse cortical atrophy. Chronic ischemic white matter disease. 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 No acute intracranial abnormality seen.   Electronically Signed   By: Roque Lias M.D.   On: 10/22/2013 09:25    Scheduled Meds: . antiseptic oral rinse  7 mL Mouth Rinse BID  . aspirin  81 mg Oral q morning - 10a  . cholecalciferol  400 Units Oral q morning - 10a  . enoxaparin (LOVENOX) injection  40 mg Subcutaneous Q24H  . piperacillin-tazobactam (ZOSYN)  IV  3.375 g Intravenous Q8H  . vancomycin  1,250 mg Intravenous Q24H   Continuous Infusions: . dextrose 5 % and 0.9% NaCl 75 mL/hr at 10/22/13 2000    Principal Problem:   PNA (pneumonia) Active  Problems:   Dementia   DM (diabetes mellitus)   DNR (do not resuscitate)   Sepsis   Syncope   Hypothermia   LOC (loss of consciousness)    Time spent: 35 minutes.     Hartley Barefoot A  [facility] Pager [phone]. If 7PM-7AM, please contact night-coverage at [email or web address], password [facility] 10/23/2013, 7:11 AM  LOS: 1 day

## 2013-10-24 DIAGNOSIS — F0390 Unspecified dementia without behavioral disturbance: Secondary | ICD-10-CM

## 2013-10-24 LAB — BASIC METABOLIC PANEL
Anion gap: 13 (ref 5–15)
BUN: 15 mg/dL (ref 6–23)
CALCIUM: 8.8 mg/dL (ref 8.4–10.5)
CO2: 21 mEq/L (ref 19–32)
Chloride: 104 mEq/L (ref 96–112)
Creatinine, Ser: 0.93 mg/dL (ref 0.50–1.10)
GFR calc non Af Amer: 53 mL/min — ABNORMAL LOW (ref >90)
GFR, EST AFRICAN AMERICAN: 62 mL/min — AB (ref >90)
Glucose, Bld: 120 mg/dL — ABNORMAL HIGH (ref 70–99)
POTASSIUM: 3.9 meq/L (ref 3.7–5.3)
Sodium: 138 mEq/L (ref 137–147)

## 2013-10-24 LAB — URINE CULTURE: Colony Count: >=100000

## 2013-10-24 LAB — CBC
HCT: 31.2 % — ABNORMAL LOW (ref 36.0–46.0)
Hemoglobin: 10.1 g/dL — ABNORMAL LOW (ref 12.0–15.0)
MCH: 27.7 pg (ref 26.0–34.0)
MCHC: 32.4 g/dL (ref 30.0–36.0)
MCV: 85.7 fL (ref 78.0–100.0)
Platelets: 188 10*3/uL (ref 150–400)
RBC: 3.64 MIL/uL — ABNORMAL LOW (ref 3.87–5.11)
RDW: 14.2 % (ref 11.5–15.5)
WBC: 4.8 10*3/uL (ref 4.0–10.5)

## 2013-10-24 LAB — GLUCOSE, CAPILLARY
GLUCOSE-CAPILLARY: 86 mg/dL (ref 70–99)
GLUCOSE-CAPILLARY: 133 mg/dL — AB (ref 70–99)
GLUCOSE-CAPILLARY: 155 mg/dL — AB (ref 70–99)
GLUCOSE-CAPILLARY: 138 mg/dL — AB (ref 70–99)
Glucose-Capillary: 95 mg/dL (ref 70–99)
Glucose-Capillary: 151 mg/dL — ABNORMAL HIGH (ref 70–99)
Glucose-Capillary: 120 mg/dL — ABNORMAL HIGH (ref 70–99)

## 2013-10-24 MED ORDER — MEMANTINE HCL 5 MG PO TABS
5.0000 mg | ORAL_TABLET | Freq: Two times a day (BID) | ORAL | Status: DC
  Administered 2013-10-24 – 2013-10-25 (×3): 5 mg via ORAL
  Filled 2013-10-24 (×4): qty 1

## 2013-10-24 MED ORDER — MEMANTINE HCL ER 14 MG PO CP24: 14.0000 mg | ORAL_CAPSULE | Freq: Every morning | ORAL | Status: DC

## 2013-10-24 MED ORDER — POLYETHYLENE GLYCOL 3350 17 G PO PACK
17.0000 g | PACK | Freq: Every day | ORAL | Status: DC
  Administered 2013-10-24 – 2013-10-25 (×2): 17 g via ORAL
  Filled 2013-10-24 (×2): qty 1

## 2013-10-24 NOTE — Progress Notes (Signed)
Utilization review completed.  

## 2013-10-24 NOTE — Progress Notes (Signed)
[facility] PROGRESS NOTE  Desiree Gonzalez [id number]:[id number] DOB: 1923/04/18 DOA: 10/22/2013 PCP: Elie Confer, MD  Assessment/Plan: Desiree Gonzalez is a 80 y.o. female recently admitted 7-30 for similar presentation, unresponsiveness, syncope, hypothermia, diverticulitis, encephalopathy, sub- acute infarct, prior history of UTI/Sepsis ( 7/23), History of ESBL UTI, Dementia, Stage III CKD, Diabetes, who was found unresponsive at SNF. Per EMS patient was bradycardic, unclear how low was HR, also Blood sugar was low at 68. In the ED patient was found to be hypothermic T at 94, Chest x ray with new density left lung, ? Atelectasis   1-Syncope: Probably related to hypoglycemia, hypothermia, bradycardia. After correction of hypothermia, hypoglycemia no further bradycardia. Repeat EKG with improved bradycardia and PR interval.   2-Sepsis, Hypothermia, hypotension: Could be related to infectious process, treating  for PNA, UTI. TSH normal,  cortisol level at 14. - Urine culture 100 K Gram negative Rods.  -Continue with Vancomycin and Zosyn day 3.   3-PNA; Patient with hypothermia, chest x ray with density left lung, recently discharge from hospital. Continue Vancomycin and zosyn day 3. Speech recommend Dysphagia 2 diet.   4-Bradycardia: Resolved with correction of  hypoglycemia, hypothermia, holding  betablocker, and donepezil.   5-Hypoglycemia: Hold glipizide. Resolved. Will discontinue D 5. Will probably avoid oral hypoglycemic agents at discharge. Continue to monitor blood sugar with meals.   6-Diabetes: hold glipizide. Patient presents with hypoglycemia.   7-Encephalopathy: Admitted more lethargic that baseline. Per daughter, patient able to eat by herself, no very interactive. Treat for infection. Will discussed further with daughter involvement of palliative care team.   Code Status: DNR Family Communication: daughter updated on 8-22. Waiting for daughter to discussed goals of  care.  Disposition Plan: might be able to be transfer later today.    Consultants:  none  Procedures:  none  Antibiotics:  Vancomycin 8-22  Zosyn 8-22  HPI/Subjective: Patient more alert today, said few words but not making sense.   Objective: Filed Vitals:   10/24/13 0324  BP: 150/75  Pulse: 84  Temp: 97.8 F (36.6 C)  Resp: 15    Intake/Output Summary (Last 24 hours) at 10/24/13 0800 Last data filed at 10/24/13 0500  Gross per 24 hour  Intake   2025 ml  Output   1775 ml  Net    250 ml   Filed Weights   10/22/13 1446 10/23/13 0329 10/24/13 0324  Weight: 87.2 kg (192 lb 3.9 oz) 87 kg (191 lb 12.8 oz) 87.9 kg (193 lb 12.6 oz)    Exam:   General:  More alert, follow some commands.   Cardiovascular: S 1, S 2 RRR  Respiratory: Diminished breath sounds.   Abdomen: BS present, soft, NT  Musculoskeletal: trace edema.   Neuro: Alert, following some command.   Data Reviewed: Basic Metabolic Panel:  Recent Labs Lab 10/22/13 0551 10/23/13 0245 10/24/13 0305  NA 139 142 138  K 4.0 3.7 3.9  CL 104 106 104  CO2 GLUCOSE 89 133* 120*  BUN CREATININE 0.81 1.01 0.93  CALCIUM 9.1 8.9 8.8   Liver Function Tests:  Recent Labs Lab 10/22/13 0551  AST 23  ALT 17  ALKPHOS 91  BILITOT <0.2*  PROT 6.1  ALBUMIN 2.8*   No results found for this basename: LIPASE, AMYLASE,  in the last 168 hours No results found for this basename: AMMONIA,  in the last 168 hours CBC:  Recent Labs Lab 10/22/13 0551 10/22/13  1610 10/23/13 0245 10/24/13 0305  WBC 4.6 5.0 5.2 4.8  NEUTROABS  --  2.8  --   --   HGB 11.0* 10.9* 11.2* 10.1*  HCT 32.8* 32.3* 33.4* 31.2*  MCV 85.0 85.4 85.9 85.7  PLT 206 213 223 188   Cardiac Enzymes: No results found for this basename: CKTOTAL, CKMB, CKMBINDEX, TROPONINI,  in the last 168 hours BNP (last 3 results) No results found for this basename: PROBNP,  in the last 8760 hours CBG:  Recent Labs Lab  10/23/13 1110 10/23/13 1633 10/23/13 1909 10/23/13 2310 10/24/13 0320  GLUCAP 151* 133* 187* 95 133*    Recent Results (from the past 240 hour(s))  URINE CULTURE     Status: None   Collection Time    10/22/13  8:36 AM      Result Value Ref Range Status   Specimen Description URINE, CATHETERIZED   Final   Special Requests NONE   Final   Culture  Setup Time     Final   Value: 10/22/2013 18:17     Performed at [facility] Count     Final   Value: >=100,000 COLONIES/ML     Performed at [facility]   Culture     Final   Value: GRAM NEGATIVE RODS     Performed at [facility]   Report Status PENDING   Incomplete  CULTURE, BLOOD (ROUTINE X 2)     Status: None   Collection Time    10/22/13 11:10 AM      Result Value Ref Range Status   Specimen Description BLOOD RIGHT ARM   Final   Special Requests BOTTLES DRAWN AEROBIC AND ANAEROBIC   Final   Culture  Setup Time     Final   Value: 10/22/2013 19:59     Performed at [facility]   Culture     Final   Value:        BLOOD CULTURE RECEIVED NO GROWTH TO DATE CULTURE WILL BE HELD FOR 5 DAYS BEFORE ISSUING A FINAL NEGATIVE REPORT     Performed at [facility]   Report Status PENDING   Incomplete  CULTURE, BLOOD (ROUTINE X 2)     Status: None   Collection Time    10/22/13 11:20 AM      Result Value Ref Range Status   Specimen Description BLOOD RIGHT HAND   Final   Special Requests BOTTLES DRAWN AEROBIC AND ANAEROBIC   Final   Culture  Setup Time     Final   Value: 10/22/2013 19:59     Performed at [facility]   Culture     Final   Value:        BLOOD CULTURE RECEIVED NO GROWTH TO DATE CULTURE WILL BE HELD FOR 5 DAYS BEFORE ISSUING A FINAL NEGATIVE REPORT     Performed at [facility]   Report Status PENDING   Incomplete  MRSA PCR SCREENING     Status: None   Collection Time    10/22/13  2:50 PM      Result Value Ref Range Status   MRSA by PCR NEGATIVE   NEGATIVE Final   Comment:            The GeneXpert MRSA Assay (FDA     approved for NASAL specimens     only), is one component of a     comprehensive MRSA colonization  surveillance program. It is not     intended to diagnose MRSA     infection nor to guide or     monitor treatment for     MRSA infections.     Studies: Dg Chest 1 View  10/22/2013   CLINICAL DATA:  Loss of consciousness.  EXAM: CHEST - 1 VIEW  COMPARISON:  September 29, 2013.  FINDINGS: Stable cardiomediastinal silhouette. No pneumothorax or pleural effusion is noted. Stable interstitial densities are noted throughout both lungs which are most consistent with chronic scarring. New ill-defined linear density is noted laterally in left lung base consistent with subsegmental atelectasis. Old proximal left humeral fracture is again noted. Old right clavicular fracture is noted.  IMPRESSION: Stable interstitial densities throughout both lungs most consistent with scarring. New ill-defined density seen laterally in left lung base most consistent with subsegmental atelectasis.   Electronically Signed   By: Roque Lias M.D.   On: 10/22/2013 09:39   Ct Head Wo Contrast  10/22/2013   CLINICAL DATA:  Loss of consciousness.  EXAM: CT HEAD WITHOUT CONTRAST  TECHNIQUE: Contiguous axial images were obtained from the base of the skull through the vertex without intravenous contrast.  COMPARISON:  CT scan of September 29, 2013; MRI scan of October 04, 2013.  FINDINGS: Bony calvarium appears intact. Diffuse cortical atrophy is noted. Chronic ischemic white matter disease is noted. Stable old infarction of right basal ganglia is noted. No mass effect or midline shift is noted. Ventricular size is within normal limits. There is no evidence of mass lesion, hemorrhage or acute infarction.  IMPRESSION: Diffuse cortical atrophy. Chronic ischemic white matter disease. 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 No acute intracranial abnormality seen.   Electronically  Signed   By: Roque Lias M.D.   On: 10/22/2013 09:25    Scheduled Meds: . antiseptic oral rinse  7 mL Mouth Rinse BID  . aspirin  81 mg Oral q morning - 10a  . cholecalciferol  400 Units Oral q morning - 10a  . enoxaparin (LOVENOX) injection  40 mg Subcutaneous Q24H  . piperacillin-tazobactam (ZOSYN)  IV  3.375 g Intravenous Q8H  . vancomycin  1,250 mg Intravenous Q24H   Continuous Infusions: . dextrose 5 % and 0.9% NaCl 1,000 mL (10/23/13 2141)    Principal Problem:   PNA (pneumonia) Active Problems:   Dementia   DM (diabetes mellitus)   DNR (do not resuscitate)   Sepsis   Syncope   Hypothermia   LOC (loss of consciousness)    Time spent: 35 minutes.     Hartley Barefoot A  [facility] Pager [phone]. If 7PM-7AM, please contact night-coverage at [email or web address], password [facility] 10/24/2013, 8:00 AM  LOS: 2 days

## 2013-10-24 NOTE — Progress Notes (Signed)
NURSING PROGRESS NOTE  Desiree Gonzalez [id number] Transfer Data: 10/24/2013 11:32 AM Attending Provider: Alba Cory, MD WUJ:[id number],FAOZHY Alvino Chapel, MD Code Status: DNR   Desiree Gonzalez is a 80 y.o. female patient transferred from 3S  -No acute distress noted.  -No complaints of shortness of breath.  -No complaints of chest pain.   Last Documented Vital Signs: Blood pressure 160/61, pulse 86, temperature 97.6 F (36.4 C), temperature source Axillary, resp. rate 15, height  (1.702 m), weight 87.9 kg (193 lb 12.6 oz), SpO2 97.00%.  IV Fluids:  IV in place, occlusive dsg intact without redness, IV cath antecubital left, condition patent and no redness none.   Allergies:  Review of patient's allergies indicates no known allergies.  Past Medical History:   has a past medical history of CVA (cerebral infarction); Alzheimer's dementia; GERD (gastroesophageal reflux disease); Hypertension; Osteopenia; Diabetes mellitus without complication; and Stage III chronic kidney disease (10/02/2013).  Past Surgical History:   has no past surgical history on file.  Social History:   reports that she has never smoked. She has never used smokeless tobacco. She reports that she does not drink alcohol or use illicit drugs.  Skin: intact  Patient is not oriented and somewhat lethargic. Is able to be aroused but does not answer questions. Made comfortable and call bell is in reach. No family present. Will continue to monitor.

## 2013-10-24 NOTE — Evaluation (Signed)
Physical Therapy Evaluation Patient Details Name: Desiree Gonzalez MRN: [id number] DOB: [birth year] Today's Date: 10/24/2013   History of Present Illness  Patient is a 80 y/o female, currently a nursing home resident who was found unresponsiveness and brought to ER on 10/22/13. Similar episode happened on 09/29/13. Pt found to be hypothermic. PMH significant for diverticulitis, encephalopathy, sub- acute infarct, prior history of UTI/Sepsis (7/23), Dementia, Stage III CKD and DM. Chest x ray with new density left lung, ? Atelectasis    Clinical Impression  Patient presents with functional limitations due to deficits listed in PT problem list. Pt with baseline cognitive deficits secondary to dementia, generalized weakness and balance deficits limiting safe mobility. Pt mainly non verbal but follows 50% of commands with increased repetition and manual cues. Pt would benefit from acute PT and follow up ST SNF to improve transfers, gait and overall mobility so pt can maximize independence and ease burden of care at discharge.    Follow Up Recommendations SNF;Supervision/Assistance - 24 hour    Equipment Recommendations  None recommended by PT    Recommendations for Other Services       Precautions / Restrictions Precautions Precautions: Fall Restrictions Weight Bearing Restrictions: No      Mobility  Bed Mobility Overal bed mobility: Needs Assistance Bed Mobility: Supine to Sit     Supine to sit: Mod assist;HOB elevated     General bed mobility comments: HOB raised, increased time, VC for sequencing and technique/hand placement. Constant cues to perform task. Assist with BLEs and assisting trunk to upright.  Transfers Overall transfer level: Needs assistance Equipment used: Rolling walker (2 wheeled) Transfers: Sit to/from Stand Sit to Stand: Min assist         General transfer comment: Able to stand from elevated bed height with RW. VC for hand placement. Min A to stand and to  steady self upon standing.  Ambulation/Gait Ambulation/Gait assistance: Min assist Ambulation Distance (Feet): 22 Feet Assistive device: Rolling walker (2 wheeled) Gait Pattern/deviations: Step-through pattern;Decreased stride length;Trunk flexed Gait velocity: Decreased   General Gait Details: Manual cues to assist with negotiating RW especially with turns and tight spaces. Difficulty problem solving getting RW untangled in objects/chairs. Min A for balance and safety.  Stairs            Wheelchair Mobility    Modified Rankin (Stroke Patients Only)       Balance Overall balance assessment: Needs assistance   Sitting balance-Leahy Scale: Fair Sitting balance - Comments: Able to perform static sitting EOB without assist. Supervision for safety as pt trying to get up.   Standing balance support: During functional activity;Bilateral upper extremity supported Standing balance-Leahy Scale: Poor Standing balance comment: Requires UE support for all dynamic standing activities and gait due to balance deficits and cognitive deficits.                              Pertinent Vitals/Pain Pain Assessment: No/denies pain    Home Living Family/patient expects to be discharged to:: Skilled nursing facility                 Additional Comments: Pt unable to provide hx due to advanced dementia. Per chart, pt is from SNF. Per daughter, pt ambulates with RW and works with therapy at [facility]. Able to feed self.     Prior Function           Comments: Per daughter, pt requires assist  for all ADLs and supervision for ambulation as pt tends to forget about RW.     Hand Dominance   Dominant Hand: Right    Extremity/Trunk Assessment   Upper Extremity Assessment: Generalized weakness           Lower Extremity Assessment: Generalized weakness         Communication   Communication: HOH  Cognition Arousal/Alertness: Awake/alert Behavior During Therapy: Flat  affect Overall Cognitive Status: History of cognitive impairments - at baseline                      General Comments General comments (skin integrity, edema, etc.): Wheezing noted post gait training however vitals stable.    Exercises General Exercises - Lower Extremity Ankle Circles/Pumps: Both;10 reps;Seated Long Arc Quad: Both;20 reps;Seated Hip Flexion/Marching: Both;20 reps;Seated      Assessment/Plan    PT Assessment Patient needs continued PT services  PT Diagnosis Generalized weakness   PT Problem List Decreased strength;Cardiopulmonary status limiting activity;Decreased cognition;Decreased activity tolerance;Decreased knowledge of use of DME;Decreased safety awareness;Decreased balance;Decreased mobility;Decreased knowledge of precautions  PT Treatment Interventions DME instruction;Balance training;Gait training;Functional mobility training;Therapeutic activities;Therapeutic exercise   PT Goals (Current goals can be found in the Care Plan section) Acute Rehab PT Goals Patient Stated Goal: none stated PT Goal Formulation: Patient unable to participate in goal setting    Frequency Min 2X/week   Barriers to discharge        Co-evaluation               End of Session Equipment Utilized During Treatment: Gait belt Activity Tolerance: Patient tolerated treatment well Patient left: in chair;with call bell/phone within reach;with chair alarm set Nurse Communication: Mobility status         Time: 9604-5409 PT Time Calculation (min): 21 min   Charges:   PT Evaluation $Initial PT Evaluation Tier I: 1 Procedure PT Treatments $Gait Training: 8-22 mins   PT G CodesAlvie Heidelberg A 10/24/2013, 3:44 PM Alvie Heidelberg, PT, DPT [phone]

## 2013-10-24 NOTE — Progress Notes (Signed)
Report called, pt transferring to [id number] via bed with belongings.

## 2013-10-25 DIAGNOSIS — A419 Sepsis, unspecified organism: Secondary | ICD-10-CM

## 2013-10-25 LAB — GLUCOSE, CAPILLARY
Glucose-Capillary: 112 mg/dL — ABNORMAL HIGH (ref 70–99)
Glucose-Capillary: 132 mg/dL — ABNORMAL HIGH (ref 70–99)

## 2013-10-25 MED ORDER — CIPROFLOXACIN HCL 500 MG PO TABS
500.0000 mg | ORAL_TABLET | Freq: Two times a day (BID) | ORAL | Status: DC
  Administered 2013-10-25: 500 mg via ORAL
  Filled 2013-10-25 (×3): qty 1

## 2013-10-25 MED ORDER — MEMANTINE HCL 5 MG PO TABS: 5.0000 mg | ORAL_TABLET | Freq: Two times a day (BID) | ORAL | Status: AC

## 2013-10-25 MED ORDER — CIPROFLOXACIN HCL 500 MG PO TABS: 500.0000 mg | ORAL_TABLET | Freq: Two times a day (BID) | ORAL | Status: AC

## 2013-10-25 NOTE — Progress Notes (Signed)
Report called to SNF

## 2013-10-25 NOTE — Progress Notes (Addendum)
Patient has orders to be d/c to SNF. Attempted to call patient's daughter home phone. Unable to leave message because her voicemail mailbox is full. Attempted to call daughter's work number, no answer at this time. RN's number provided.   12:04 daughter Elson Clan called RN back and was updated

## 2013-10-25 NOTE — Progress Notes (Signed)
Speech Language Pathology Treatment: Dysphagia  Patient Details Name: Desiree Gonzalez MRN: [id number] DOB: 1924-02-14 Today's Date: 10/25/2013 Time: 4098-1191 SLP Time Calculation (min): 20 min  Assessment / Plan / Recommendation Clinical Impression  Skilled treatment session focused on addressing dysphagia goals.  SLP facilitated session with positioning for PO intake as well as Max multimodal cues to for a more timely oral transit and swallow initiation of pureed textures.  Patient demonstrated cough x1 with thin liquid trials due to distraction; however, without the presence of distractions patient demonstrated no overt s/s of aspiration.  Nurse tech present and reports lots of oral holding and prolonged mastication with Dys 2 textures which resulted in 2 bites of breakfast.  As a result, recommend diet downgrade to Dys 1 textures and continuation of thin liquids with full assist.      HPI HPI: Desiree Gonzalez is an 80 y.o. female recently admitted 7-30 for similar presentation, unresponsiveness, syncope, hypothermia, diverticulitis, encephalopathy, sub- acute infarct, prior history of UTI/Sepsis ( 7/23), Dementia, Stage III CKD, Diabetes, who was found unresponsive at SNF. In the ED patient was found to be hypothermic (T at 94), and Chest x ray showed new density left lung, ? Atelectasis.    Pertinent Vitals Pain Assessment: No/denies pain  SLP Plan  Continue with current plan of care    Recommendations Diet recommendations: Dysphagia 1 (puree);Thin liquid Liquids provided via: Cup;Straw Medication Administration: Crushed with puree Supervision: Staff to assist with self feeding;Full supervision/cueing for compensatory strategies Compensations: Slow rate;Small sips/bites;Check for pocketing Postural Changes and/or Swallow Maneuvers: Upright 30-60 min after meal;Seated upright 90 degrees              Oral Care Recommendations: Oral care BID Follow up Recommendations: Skilled Nursing  facility Plan: Continue with current plan of care    GO    Charlane Ferretti., CCC-SLP [phone]  Kyrie Bun 10/25/2013, 11:40 AM

## 2013-10-25 NOTE — Progress Notes (Signed)
OT Cancellation Note  Patient Details Name: Desiree Gonzalez MRN: [id number] DOB: August 30, 1923   Cancelled Treatment:    Reason Eval/Treat Not Completed: Other (comment) Pt is from SNF and plan is to go back to SNF. No apparent immediate acute care OT needs, therefore will defer OT to SNF. If OT eval is needed please call Acute Rehab Dept. at [phone] or text page OT at [phone].    Evette Georges [phone] 10/25/2013, 7:13 AM

## 2013-10-25 NOTE — Care Management Note (Signed)
    Page 1 of 1   10/25/2013     5:23:37 PM CARE MANAGEMENT NOTE 10/25/2013  Patient:  Desiree Gonzalez, Desiree Gonzalez   Account Number:  [id number]  Date Initiated:  10/24/2013  Documentation initiated by:  Donn Pierini  Subjective/Objective Assessment:   Pt admitted with hypothermia, hypotension, sepsis     Action/Plan:   PTA pt was at SNF- [facility]- CSW consulted   Anticipated DC Date:  10/26/2013   Anticipated DC Plan:  SKILLED NURSING FACILITY  In-house referral  Clinical Social Worker      DC Planning Services  CM consult      Choice offered to / List presented to:             Status of service:  Completed, signed off Medicare Important Message given?  YES (If response is "NO", the following Medicare IM given date fields will be blank) Date Medicare IM given:  10/25/2013 Medicare IM given by:  Letha Cape Date Additional Medicare IM given:   Additional Medicare IM given by:    Discharge Disposition:  SKILLED NURSING FACILITY  Per UR Regulation:  Reviewed for med. necessity/level of care/duration of stay  If discussed at Long Length of Stay Meetings, dates discussed:    Comments:  10/25/13 1447 Letha Cape RN, BSN [phone] patient is from [facility] . CSW referral.

## 2013-10-25 NOTE — Discharge Summary (Signed)
Physician Discharge Summary  Desiree Gonzalez:[id number] DOB: 08/22/1923 DOA: 10/22/2013  PCP: Elie Confer, MD  Admit date: 10/22/2013 Discharge date: 10/25/2013  Time spent: 35 minutes  Recommendations for Outpatient Follow-up:  1. Need repeat UA.  2. Need to monitor CBG, consider low dose insulin SSI or metformin if no renal failure.  3. Palliative care evaluation at facility to continue discussion of goals of care with family.   Discharge Diagnoses:    Sepsis   Klebsiella UTI.    Syncope in setting Hypothermia, hypoglycemia, bradycardia.    Bradycardia in setting beta blockers, hypothermia, resolved.    PNA (pneumonia)   Dementia   DM (diabetes mellitus)   DNR (do not resuscitate)   Sepsis   Syncope   Hypothermia   LOC (loss of consciousness)   Discharge Condition: Stable.   Diet recommendation: Dysphagia 2 diet.   Filed Weights   10/23/13 0329 10/24/13 0324 10/25/13 0653  Weight: 87 kg (191 lb 12.8 oz) 87.9 kg (193 lb 12.6 oz) 84.1 kg (185 lb 6.5 oz)    History of present illness:  Desiree Gonzalez is a 80 y.o. female recently admitted 7-30 for similar presentation, unresponsiveness, syncope, hypothermia, diverticulitis, encephalopathy, sub- acute infarct, prior history of UTI/Sepsis ( 7/23), Dementia, Stage III CKD, Diabetes, who was found unresponsive at SNF. Per EMS patient was bradycardic, unclear how low was HR, also Blood sugar was low at 68. Patient is not able to provide significant history due to demential. She is alert, slow respond time, she was able to tell me her name. She denies any pain, no chest pain, dyspnea. She relates diarrhea, I don't know how accurate this information is.  In the ED patient was found to be hypothermic T at 94, Chest x ray with new density left lung, ? Atelectasis.    Hospital Course:  Desiree Gonzalez is a 80 y.o. female recently admitted 7-30 for similar presentation, unresponsiveness, syncope, hypothermia, diverticulitis,  encephalopathy, sub- acute infarct, prior history of UTI/Sepsis ( 7/23), History of ESBL UTI, Dementia, Stage III CKD, Diabetes, who was found unresponsive at SNF. Per EMS patient was bradycardic, unclear how low was HR, also Blood sugar was low at 68.  In the ED patient was found to be hypothermic T at 94, Chest x ray with new density left lung, ? Atelectasis   1-Syncope: Probably related to hypoglycemia, hypothermia, bradycardia. After correction of hypothermia, hypoglycemia no further bradycardia. Repeat EKG with improved bradycardia and PR interval. Metoprolol stopped.   2-Sepsis, Hypothermia, hypotension: Could be related to infectious process, treating for PNA, UTI. TSH normal, cortisol level at 14.  - Urine culture 100 K Klebsiella. Sensitive to ciprofloxacin.  -Received Vancomycin and Zosyn for 3 days.  Marland Kitchen  3-PNA; Patient with hypothermia, chest x ray with density left lung, recently discharge from hospital. Received  Vancomycin and zosyn for 3 days. Speech recommend Dysphagia 2 diet. Patient will be discharge on ciprofloxacin for 7 more days.   4-Bradycardia: Resolved with correction of hypoglycemia, hypothermia, holding betablocker, and donepezil.   5-Hypoglycemia: Hold glipizide. Resolved. Will discontinue D 5. Will probably avoid oral hypoglycemic agents at discharge. Continue to monitor blood sugar with meals.   6-Diabetes: hold glipizide. Patient presents with hypoglycemia. Could consider metformin out patient or SSI if blood sugar increased.   7-Encephalopathy: Admitted with more lethargic that baseline. Per daughter, patient able to eat by herself, no very interactive. Treat for infection. Daughter not interested in palliative care at this time. Need  to discussed further at the facility.   Procedures:  none  Consultations:  None  Discharge Exam: Filed Vitals:   10/25/13 0738  BP: 144/78  Pulse:   Temp:   Resp:     General: Alert, following commands.   Cardiovascular: S 1, S 2 RRR Respiratory: CTA  Discharge Instructions You were cared for by a hospitalist during your hospital stay. If you have any questions about your discharge medications or the care you received while you were in the hospital after you are discharged, you can call the unit and asked to speak with the hospitalist on call if the hospitalist that took care of you is not available. Once you are discharged, your primary care physician will handle any further medical issues. Please note that NO REFILLS for any discharge medications will be authorized once you are discharged, as it is imperative that you return to your primary care physician (or establish a relationship with a primary care physician if you do not have one) for your aftercare needs so that they can reassess your need for medications and monitor your lab values.  Discharge Instructions   Diet - low sodium heart healthy    Complete by:  As directed      Increase activity slowly    Complete by:  As directed             Medication List    STOP taking these medications       donepezil 10 MG tablet  Commonly known as:  ARICEPT     glipiZIDE 10 MG 24 hr tablet  Commonly known as:  GLUCOTROL XL     metoprolol tartrate 25 MG tablet  Commonly known as:  LOPRESSOR     NAMENDA XR 14 MG Cp24  Generic drug:  Memantine HCl ER  Replaced by:  memantine 5 MG tablet      TAKE these medications       acetaminophen 500 MG tablet  Commonly known as:  TYLENOL  Take 1,000 mg by mouth every 6 (six) hours as needed for moderate pain.     antiseptic oral rinse 0.05 % Liqd solution  Commonly known as:  CPC / CETYLPYRIDINIUM CHLORIDE 0.05%  7 mLs by Mouth Rinse route 2 (two) times daily.     aspirin 81 MG chewable tablet  Chew 81 mg by mouth every morning.     cholecalciferol 400 UNITS Tabs tablet  Commonly known as:  VITAMIN D  Take 400 Units by mouth every morning.     ciprofloxacin 500 MG tablet  Commonly  known as:  CIPRO  Take 1 tablet (500 mg total) by mouth 2 (two) times daily.     hydrALAZINE 25 MG tablet  Commonly known as:  APRESOLINE  Take 1 tablet (25 mg total) by mouth every 6 (six) hours.     memantine 5 MG tablet  Commonly known as:  NAMENDA  Take 1 tablet (5 mg total) by mouth 2 (two) times daily.     ondansetron 4 MG tablet  Commonly known as:  ZOFRAN  Take 1 tablet (4 mg total) by mouth every 6 (six) hours as needed for nausea.     psyllium 0.52 G capsule  Commonly known as:  REGULOID  Take 0.52 g by mouth every morning.       No Known Allergies     Follow-up Information   Follow up with Elie Confer, MD In 1 week.   Specialty:  Family Medicine  Contact information:   [address] [place] [address] [phone]        The results of significant diagnostics from this hospitalization (including imaging, microbiology, ancillary and laboratory) are listed below for reference.    Significant Diagnostic Studies: Dg Chest 1 View  10/22/2013   CLINICAL DATA:  Loss of consciousness.  EXAM: CHEST - 1 VIEW  COMPARISON:  September 29, 2013.  FINDINGS: Stable cardiomediastinal silhouette. No pneumothorax or pleural effusion is noted. Stable interstitial densities are noted throughout both lungs which are most consistent with chronic scarring. New ill-defined linear density is noted laterally in left lung base consistent with subsegmental atelectasis. Old proximal left humeral fracture is again noted. Old right clavicular fracture is noted.  IMPRESSION: Stable interstitial densities throughout both lungs most consistent with scarring. New ill-defined density seen laterally in left lung base most consistent with subsegmental atelectasis.   Electronically Signed   By: Roque Lias M.D.   On: 10/22/2013 09:39   Dg Chest 1 View  09/29/2013   CLINICAL DATA:  Unresponsive, fatigue.  EXAM: CHEST - 1 VIEW  COMPARISON:  Chest radiograph September 18, 2013  FINDINGS:  Cardiac silhouette appears upper limits of normal, even with consideration to this low inspiratory portable examination with crowded vascular markings. Calcified aortic knob. Mild chronic interstitial changes, left lung base strandy densities. No pleural effusions. Biapical pleural thickening. No pneumothorax.  Subacute to chronic left humerus surgical neck fracture. Patient is osteopenic. Multiple EKG lines overlie the patient and may obscure subtle underlying pathology.  IMPRESSION: Borderline cardiomegaly, left lung base atelectasis and this low inspiratory portable examination.   Electronically Signed   By: Awilda Metro   On: 09/29/2013 13:21   Ct Head Wo Contrast  10/22/2013   CLINICAL DATA:  Loss of consciousness.  EXAM: CT HEAD WITHOUT CONTRAST  TECHNIQUE: Contiguous axial images were obtained from the base of the skull through the vertex without intravenous contrast.  COMPARISON:  CT scan of September 29, 2013; MRI scan of October 04, 2013.  FINDINGS: Bony calvarium appears intact. Diffuse cortical atrophy is noted. Chronic ischemic white matter disease is noted. Stable old infarction of right basal ganglia is noted. No mass effect or midline shift is noted. Ventricular size is within normal limits. There is no evidence of mass lesion, hemorrhage or acute infarction.  IMPRESSION: Diffuse cortical atrophy. Chronic ischemic white matter disease. 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 No acute intracranial abnormality seen.   Electronically Signed   By: Roque Lias M.D.   On: 10/22/2013 09:25   Ct Head Wo Contrast  09/29/2013   CLINICAL DATA:  Episode of unresponsiveness.  EXAM: CT HEAD WITHOUT CONTRAST  TECHNIQUE: Contiguous axial images were obtained from the base of the skull through the vertex without intravenous contrast.  COMPARISON:  09/18/2013.  FINDINGS: No skull fracture or intracranial hemorrhage.  Remote infarct right basal ganglia/ corona radiata with subsequent dilation right lateral  ventricle.  Prominent small vessel disease type changes.  Remote small infarct left basal ganglia.  No CT evidence of large acute infarct.  Atrophy without hydrocephalus.  No intracranial mass lesion noted on this unenhanced exam.  Polypoid opacification left frontal sinus.  IMPRESSION: Remote infarcts and small vessel disease type changes without CT evidence of large acute infarct.  No intracranial hemorrhage.  Atrophy without hydrocephalus.  Polypoid opacification left frontal sinus.   Electronically Signed   By: Bridgett Larsson M.D.   On: 09/29/2013 13:23   Mr  Brain Wo Contrast  10/04/2013   CLINICAL DATA:  80 year old female unresponsive at nursing home. Hypothermia. Initial encounter.  EXAM: MRI HEAD WITHOUT CONTRAST  TECHNIQUE: Multiplanar, multiecho pulse sequences of the brain and surrounding structures were obtained without intravenous contrast.  COMPARISON:  Head CT 09/29/2013 and earlier.  Brain MRI 02/21/2010.  FINDINGS: Small nodular 8 mm focus of increased trace diffusion signal in the left basal ganglia (series 4, image 18). Isointense signal corresponding to this finding on the ADC map. Mild T2 and FLAIR hyperintensity. No associated mass effect or hemorrhage.  Chronic infarct with hemosiderin and cystic encephalomalacia in the right basal ganglia, was acute in 2011. Sequelae of Wallerian degeneration in the right brainstem. Major intracranial vascular flow voids are stable. Mild generalized cerebral volume loss elsewhere since 2011. Chronic Patchy and confluent cerebral white matter T2 and FLAIR hyperintensity not significantly changed. Cerebellum remains within normal limits for age. Visible internal auditory structures appear normal.  No midline shift, mass effect, evidence of mass lesion, ventriculomegaly, extra-axial collection or acute intracranial hemorrhage. Cervicomedullary junction and pituitary are within normal limits. Negative visualized cervical spine. Normal bone marrow signal. Trace  left mastoid fluid. Visualized orbit soft tissues are within normal limits. Visualized scalp soft tissues are within normal limits.  IMPRESSION: 1. Subacute appearing lacunar infarct left basal ganglia. No mass effect or hemorrhage. 2. Evolution of the 2011 large right basal ganglia infarct, with encephalomalacia and Wallerian degeneration.   Electronically Signed   By: Augusto Gamble M.D.   On: 10/04/2013 12:24   Ct Abdomen Pelvis W Contrast  09/29/2013   CLINICAL DATA:  Patient unresponsive.  EXAM: CT ABDOMEN AND PELVIS WITH CONTRAST  TECHNIQUE: Multidetector CT imaging of the abdomen and pelvis was performed using the standard protocol following bolus administration of intravenous contrast.  CONTRAST:  OMNIPAQUE IOHEXOL 300 MG/ML  SOLN  COMPARISON:  CT chest 02/11/2012.  FINDINGS: Liver normal. Spleen normal. Pancreas is unremarkable. No biliary distention. Gallbladder nondistended. Gallstones cannot be excluded. No pericholecystic fluid collection.  Adrenals normal. Punctate lucencies in both kidneys most consistent with simple cysts. No obstructing ureteral stone. Foley catheter in bladder. Air noted in the bladder, most likely from instrumentation. Urinary tract infection cannot be excluded. Uterus unremarkable. Tiny right ovarian simple cyst.  No significant adenopathy. Abdominal aorta normal in caliber. Aortoiliac atherosclerotic vascular disease. Visceral vessels are patent. No significant adenopathy.  Appendix normal. Extensive diverticulosis. Fat plane stranding noted adjacent to the left colon, images number 42 through 50/series 2. Findings consistent with diverticulitis. No abscess. No bowel obstruction. No free air. Stomach is nondistended. No mesenteric mass.  Heart size normal. Mild basilar atelectasis. Degenerative changes lumbar spine. Multiple lumbar compression fractures are present. These are mild. No prominent retropulsed fragments.  IMPRESSION: Mild changes of left colonic diverticulitis.  No evidence of abscess.   Electronically Signed   By: Maisie Fus  Register   On: 09/29/2013 14:05    Microbiology: Recent Results (from the past 240 hour(s))  URINE CULTURE     Status: None   Collection Time    10/22/13  8:36 AM      Result Value Ref Range Status   Specimen Description URINE, CATHETERIZED   Final   Special Requests NONE   Final   Culture  Setup Time     Final   Value: 10/22/2013 18:17     Performed at [facility]   Colony Count     Final   Value: >=100,000 COLONIES/ML  Performed at [facility]     Final   Value: KLEBSIELLA PNEUMONIAE     Performed at [facility]   Report Status 10/24/2013 FINAL   Final   Organism ID, Bacteria KLEBSIELLA PNEUMONIAE   Final  CULTURE, BLOOD (ROUTINE X 2)     Status: None   Collection Time    10/22/13 11:10 AM      Result Value Ref Range Status   Specimen Description BLOOD RIGHT ARM   Final   Special Requests BOTTLES DRAWN AEROBIC AND ANAEROBIC   Final   Culture  Setup Time     Final   Value: 10/22/2013 19:59     Performed at [facility]   Culture     Final   Value:        BLOOD CULTURE RECEIVED NO GROWTH TO DATE CULTURE WILL BE HELD FOR 5 DAYS BEFORE ISSUING A FINAL NEGATIVE REPORT     Performed at [facility]   Report Status PENDING   Incomplete  CULTURE, BLOOD (ROUTINE X 2)     Status: None   Collection Time    10/22/13 11:20 AM      Result Value Ref Range Status   Specimen Description BLOOD RIGHT HAND   Final   Special Requests BOTTLES DRAWN AEROBIC AND ANAEROBIC   Final   Culture  Setup Time     Final   Value: 10/22/2013 19:59     Performed at [facility]   Culture     Final   Value:        BLOOD CULTURE RECEIVED NO GROWTH TO DATE CULTURE WILL BE HELD FOR 5 DAYS BEFORE ISSUING A FINAL NEGATIVE REPORT     Performed at [facility]   Report Status PENDING   Incomplete  MRSA PCR SCREENING     Status: None   Collection Time    10/22/13   2:50 PM      Result Value Ref Range Status   MRSA by PCR NEGATIVE  NEGATIVE Final   Comment:            The GeneXpert MRSA Assay (FDA     approved for NASAL specimens     only), is one component of a     comprehensive MRSA colonization     surveillance program. It is not     intended to diagnose MRSA     infection nor to guide or     monitor treatment for     MRSA infections.     Labs: Basic Metabolic Panel:  Recent Labs Lab 10/22/13 0551 10/23/13 0245 10/24/13 0305  NA 139 142 138  K 4.0 3.7 3.9  CL 104 106 104  CO2 GLUCOSE 89 133* 120*  BUN CREATININE 0.81 1.01 0.93  CALCIUM 9.1 8.9 8.8   Liver Function Tests:  Recent Labs Lab 10/22/13 0551  AST 23  ALT 17  ALKPHOS 91  BILITOT <0.2*  PROT 6.1  ALBUMIN 2.8*   No results found for this basename: LIPASE, AMYLASE,  in the last 168 hours No results found for this basename: AMMONIA,  in the last 168 hours CBC:  Recent Labs Lab 10/22/13 0551 10/22/13 0839 10/23/13 0245 10/24/13 0305  WBC 4.6 5.0 5.2 4.8  NEUTROABS  --  2.8  --   --   HGB 11.0* 10.9* 11.2* 10.1*  HCT 32.8* 32.3* 33.4* 31.2*  MCV 85.0 85.4 85.9 85.7  PLT 206 213 223 188   Cardiac Enzymes: No results found for this basename: CKTOTAL, CKMB, CKMBINDEX, TROPONINI,  in the last 168 hours BNP: BNP (last 3 results) No results found for this basename: PROBNP,  in the last 8760 hours CBG:  Recent Labs Lab 10/24/13 0749 10/24/13 1221 10/24/13 1635 10/24/13 2159 10/25/13 0740  GLUCAP 151* 155* 138* 120* 112*       Signed:  Onie Kasparek A  [facility] 10/25/2013, 8:26 AM

## 2013-10-28 LAB — CULTURE, BLOOD (ROUTINE X 2)
CULTURE: NO GROWTH
Culture: NO GROWTH

## 2013-11-02 NOTE — ED Provider Notes (Signed)
Medical screening examination/treatment/procedure(s) were performed by non-physician practitioner and as supervising physician I was immediately available for consultation/collaboration.    Nelia Shi, MD 11/02/13 2215

## 2013-11-06 NOTE — Clinical Social Work Psychosocial (Addendum)
Clinical Social Work Department BRIEF PSYCHOSOCIAL ASSESSMENT 10/25/2013  Patient:  Desiree Gonzalez, Desiree Gonzalez     Account Number:  [id number]     Admit date:  10/22/2013  Clinical Social Worker:  Iona Coach  Date/Time:  10/25/2013 02:50 PM  Referred by:  Physician  Date Referred:  10/24/2013 Referred for  Other - See comment   Other Referral:   Return to SNF   Interview type:  Other - See comment Other interview type:   Daughter- Desiree Gonzalez-- patient is confused    PSYCHOSOCIAL DATA Living Status:  FACILITY Admitted from facility:  [place] Level of care:  [place] Primary support name:  Desiree Gonzalez  [phone] Primary support relationship to patient:  CHILD, ADULT Degree of support available:   Strong support    CURRENT CONCERNS Current Concerns  Other - See comment   Other Concerns:   Return to SNF    SOCIAL WORK ASSESSMENT / PLAN 80 year old female- resident of [facility].  CSW met with patient but she was noted to be alert but confused to place and time. She agreed that her daughter be consulted about her d/c plan.  Patient daughter Desiree Gonzalez was notified and agrees to return to facility Per MD- patient is medically stable for d/c today back to SNF. Fl2 completed and signed by MD.   Assessment/plan status:  Psychosocial Support/Ongoing Assessment of Needs Other assessment/ plan:   Information/referral to community resources:   None indicated    PATIENT'S/FAMILY'S RESPONSE TO PLAN OF CARE: Patient is alert and able to indicate a desire to return to [facility].  CSW spoke to her daughter Desiree Gonzalez who indicated the same and has already been in contact with  the facility.  CSW spoke to Petrey- Admissions at [facility]. Bed is availble. Nursing to call report. EMS transport arranged.  DC back to SNF today. CSW signing off.

## 2013-12-01 DEATH — deceased

## 2015-04-06 IMAGING — CR DG CHEST 1V
1 series · 1 of 1 positions shown · non-contrast
Comparison: Chest radiograph September 18, 2013

CLINICAL DATA: Unresponsive, fatigue.

EXAM:
CHEST - 1 VIEW

[x chest ap]
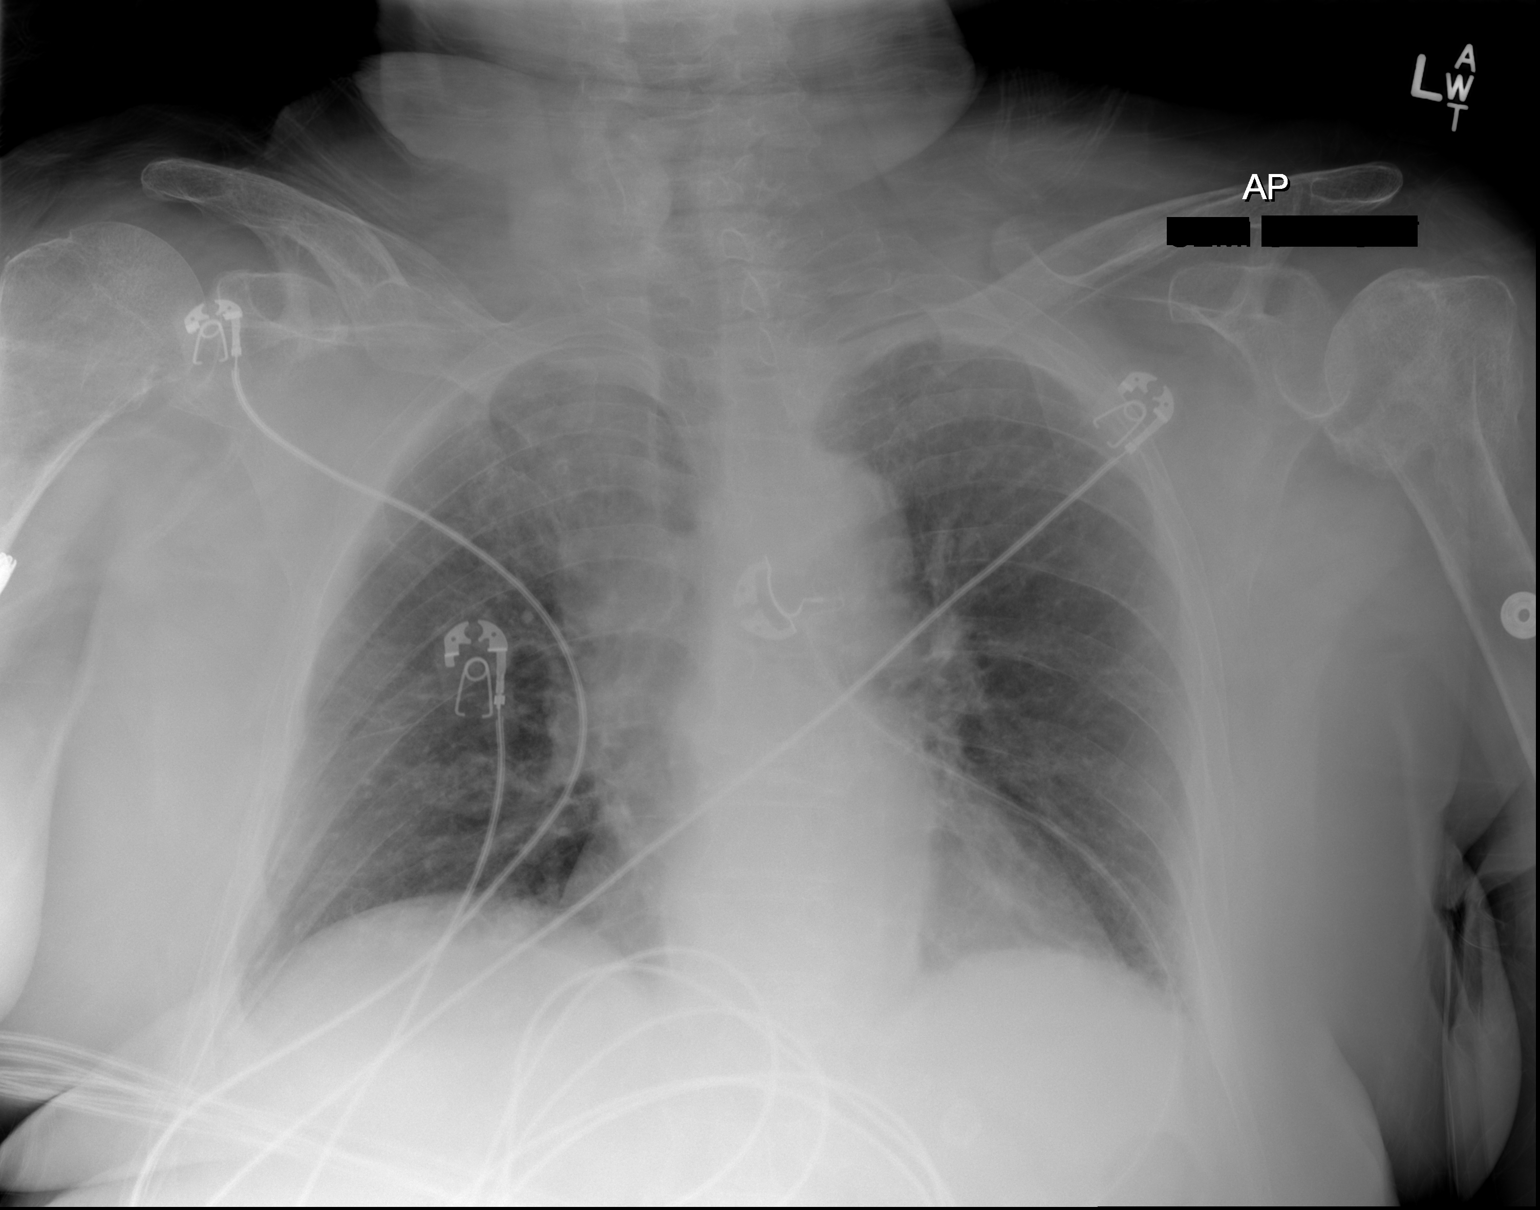

[1 of 1 positions shown; findings below may reference images not displayed]

FINDINGS: Cardiac silhouette appears upper limits of normal, even with
consideration to this low inspiratory portable examination with
crowded vascular markings. Calcified aortic knob. Mild chronic
interstitial changes, left lung base strandy densities. No pleural
effusions. Biapical pleural thickening. No pneumothorax.

Subacute to chronic left humerus surgical neck fracture. Patient is
osteopenic. Multiple EKG lines overlie the patient and may obscure
subtle underlying pathology.
IMPRESSION: Borderline cardiomegaly, left lung base atelectasis and this low
inspiratory portable examination.

  By: Nousayer Sley

## 2015-04-06 IMAGING — CT CT HEAD W/O CM
2 series · 17 of 30 positions shown, 20 images · non-contrast
Comparison: 09/18/2013.

CLINICAL DATA: Episode of unresponsiveness.

EXAM:
CT HEAD WITHOUT CONTRAST
TECHNIQUE: Contiguous axial images were obtained from the base of the skull
through the vertex without intravenous contrast.

[Series 2: bone windows · axial · 0.45mm/px · z∈[-121,-4]mm · 8 of 51 slices shown]
[im 6/51  bone]
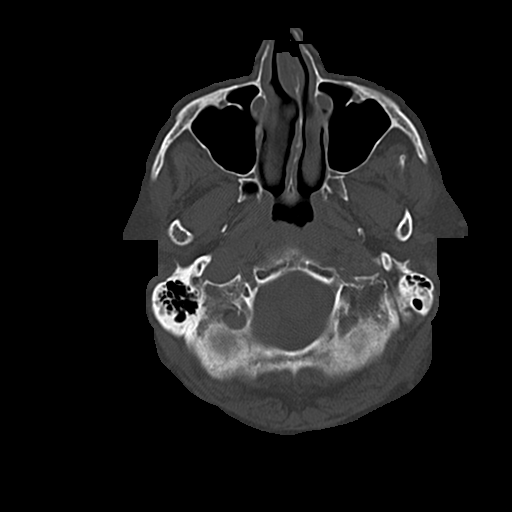
[im 12/51  bone]
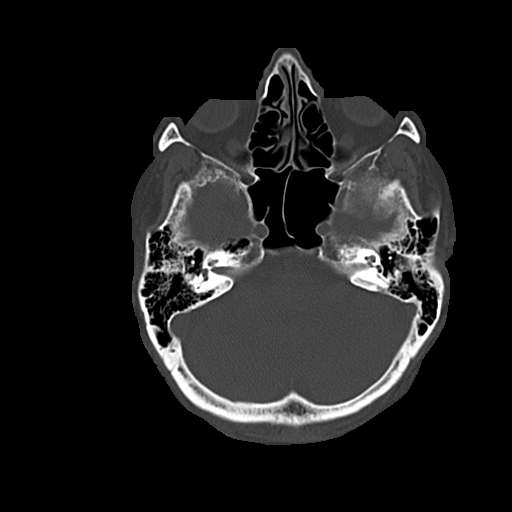
[im 17/51  bone]
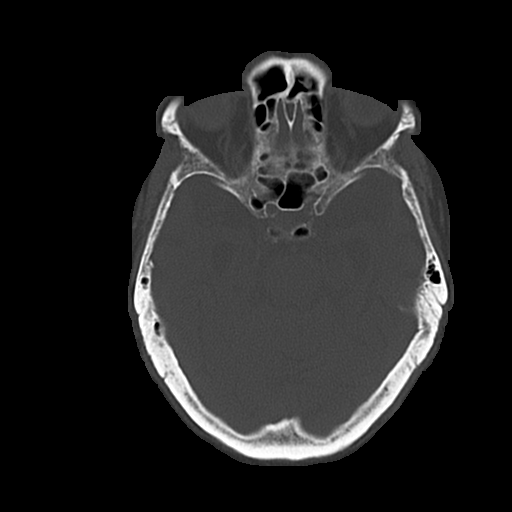
[im 23/51  bone]
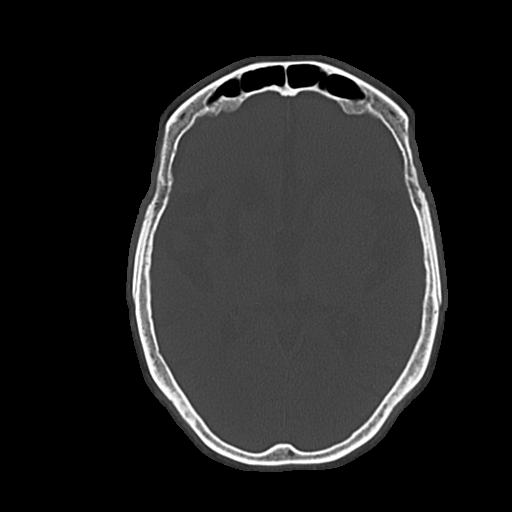
[im 28/51  bone]
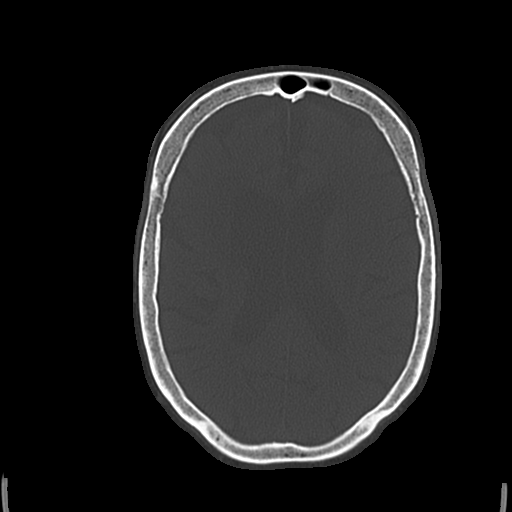
[im 34/51  bone]
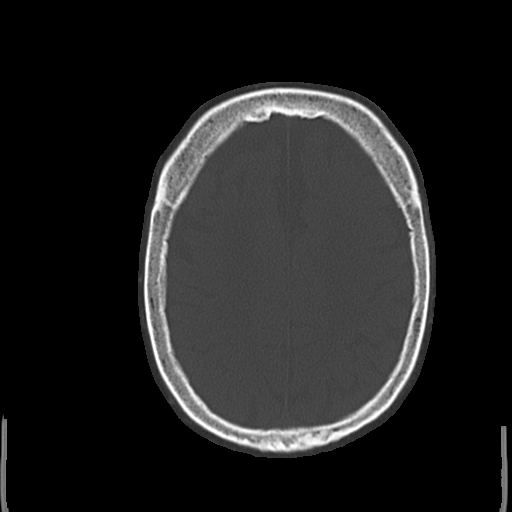
[im 39/51  bone]
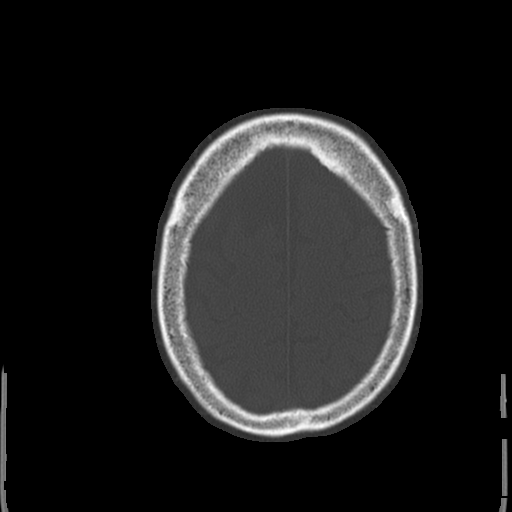
[im 45/51  bone]
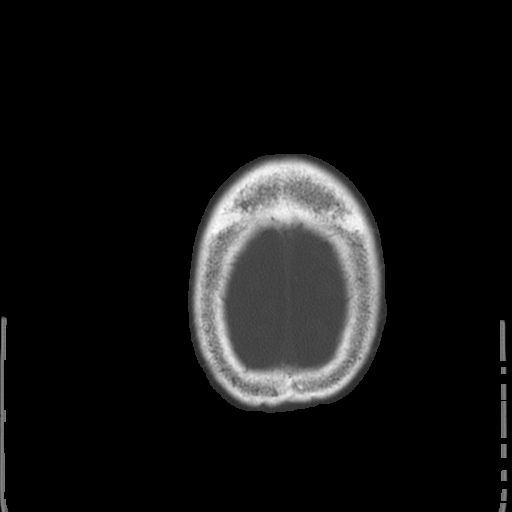

[Series 3: head w/o · axial · non-contrast · 0.45mm/px · z∈[-121,-1]mm · 9 of 31 slices shown, 12 images]
[im 4/31  brain]
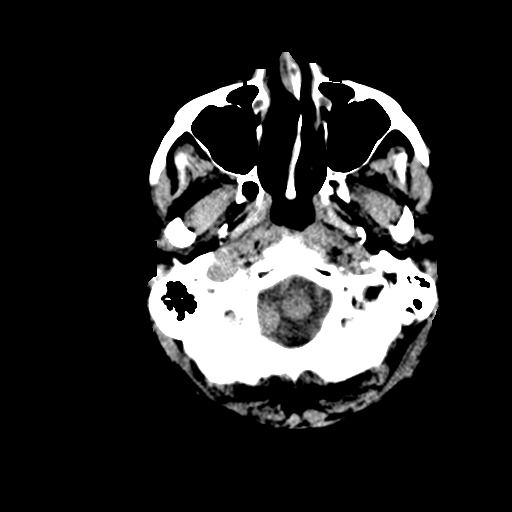
[im 4/31  bone]
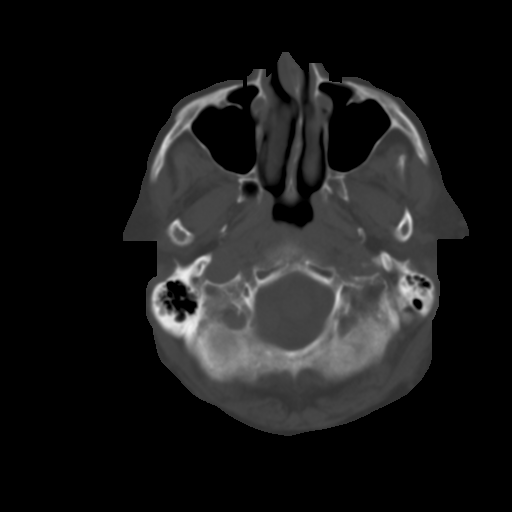
[im 7/31  brain]
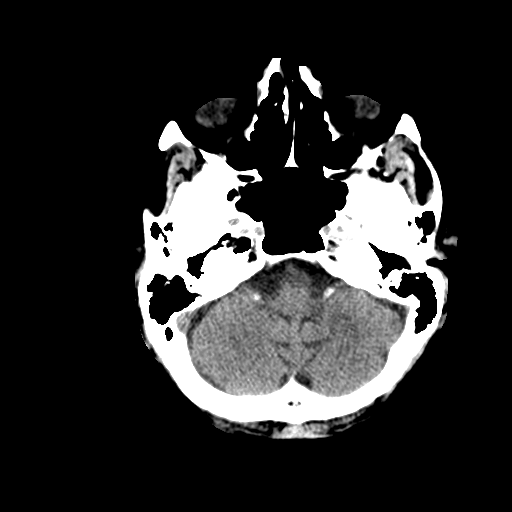
[im 10/31  brain]
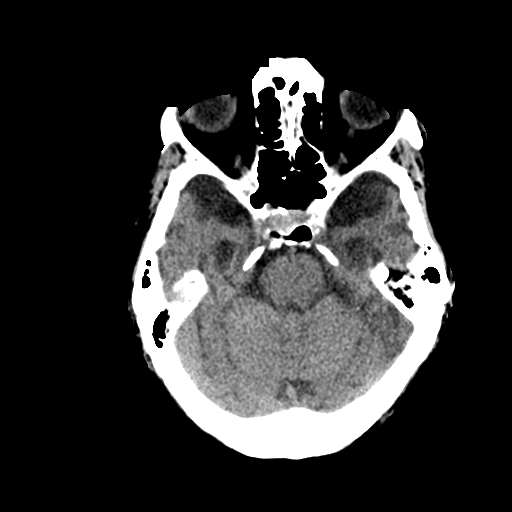
[im 13/31  brain]
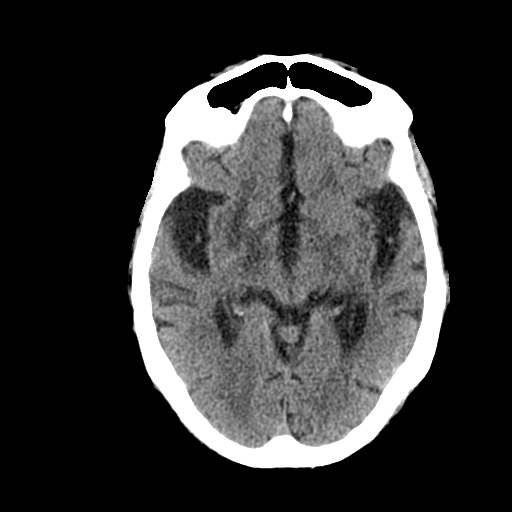
[im 16/31  brain]
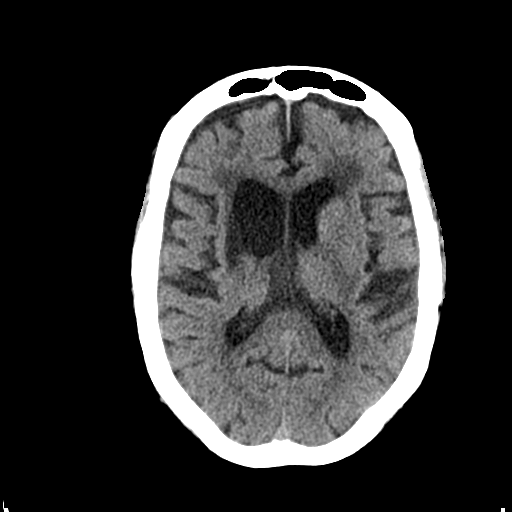
[im 16/31  bone]
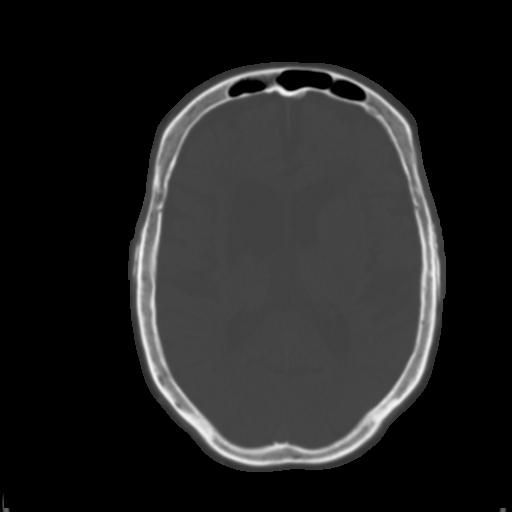
[im 19/31  brain]
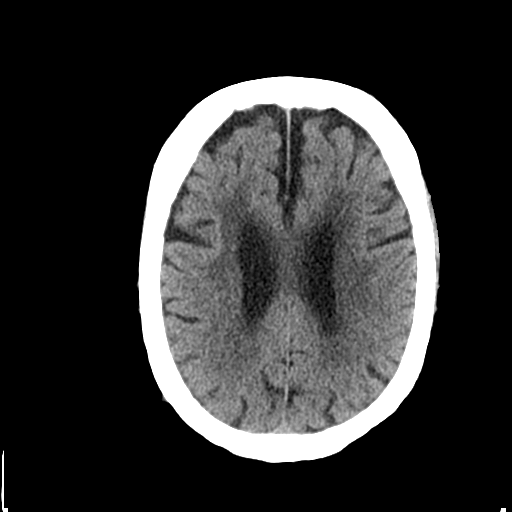
[im 22/31  brain]
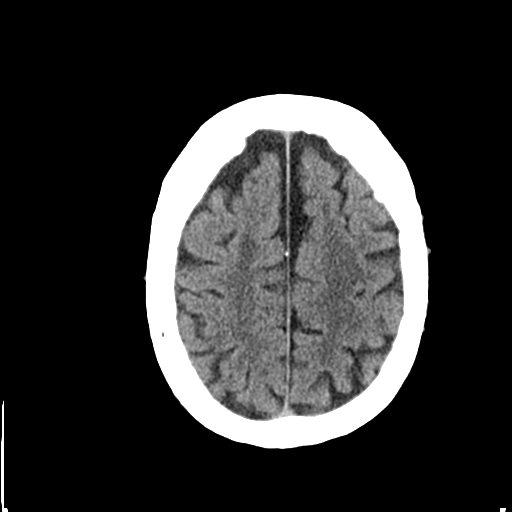
[im 25/31  brain]
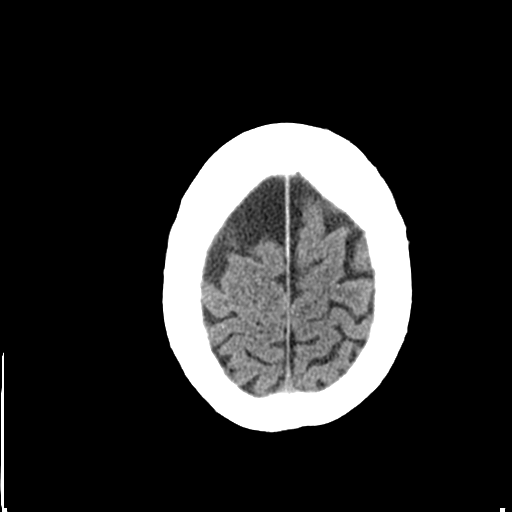
[im 28/31  brain]
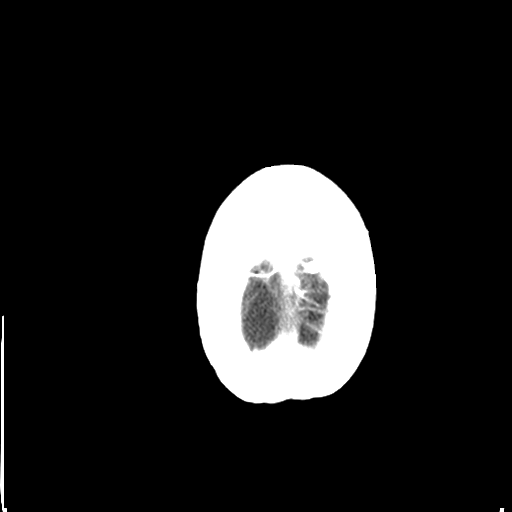
[im 28/31  bone]
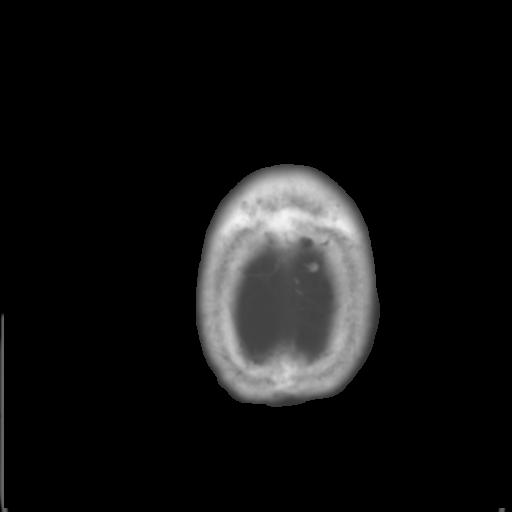

[17 of 30 positions shown; findings below may reference images not displayed]

FINDINGS: No skull fracture or intracranial hemorrhage.

Remote infarct right basal ganglia/ corona radiata with subsequent
dilation right lateral ventricle.

Prominent small vessel disease type changes.

Remote small infarct left basal ganglia.

No CT evidence of large acute infarct.

Atrophy without hydrocephalus.

No intracranial mass lesion noted on this unenhanced exam.

Polypoid opacification left frontal sinus.
IMPRESSION: Remote infarcts and small vessel disease type changes without CT
evidence of large acute infarct.

No intracranial hemorrhage.

Atrophy without hydrocephalus.

Polypoid opacification left frontal sinus.

## 2015-04-29 IMAGING — CT CT HEAD W/O CM
1 series · 16 of 30 positions shown, 20 images · non-contrast
Comparison: CT scan of September 29, 2013; MRI scan of October 04, 2013.

CLINICAL DATA: Loss of consciousness.

EXAM:
CT HEAD WITHOUT CONTRAST
TECHNIQUE: Contiguous axial images were obtained from the base of the skull
through the vertex without intravenous contrast.

[Series 2: head 5.0 h30s · axial · 0.42mm/px · z∈[-541,-401]mm · 16 of 32 slices shown, 20 images]
[im 2/32  brain]
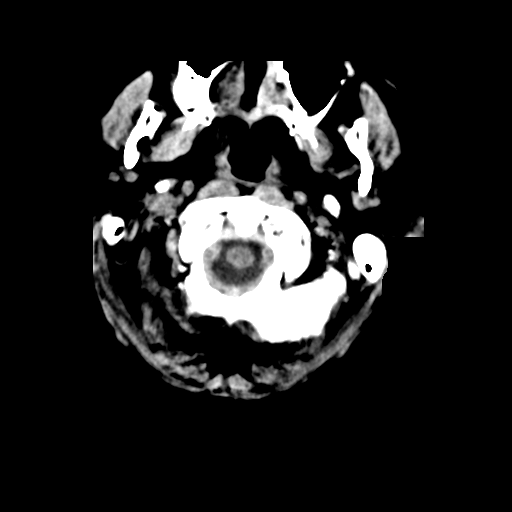
[im 2/32  bone]
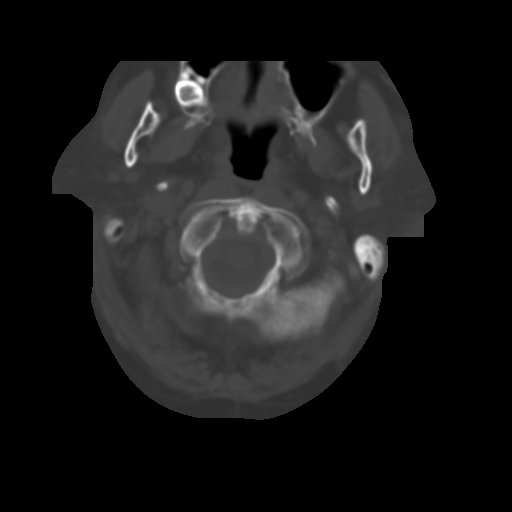
[im 4/32  brain]
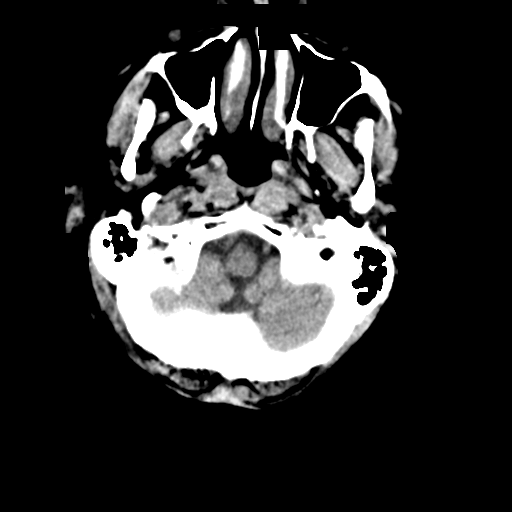
[im 6/32  brain]
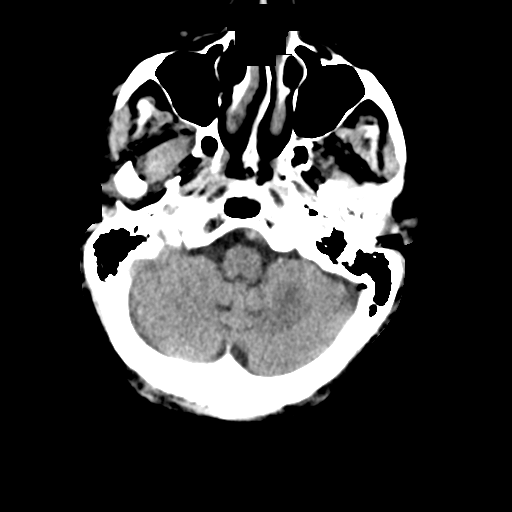
[im 8/32  brain]
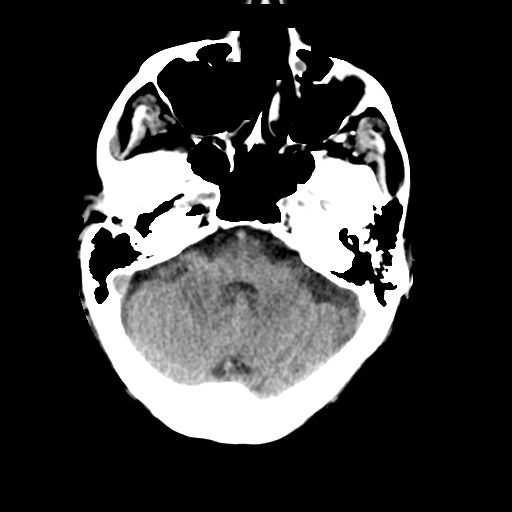
[im 9/32  brain]
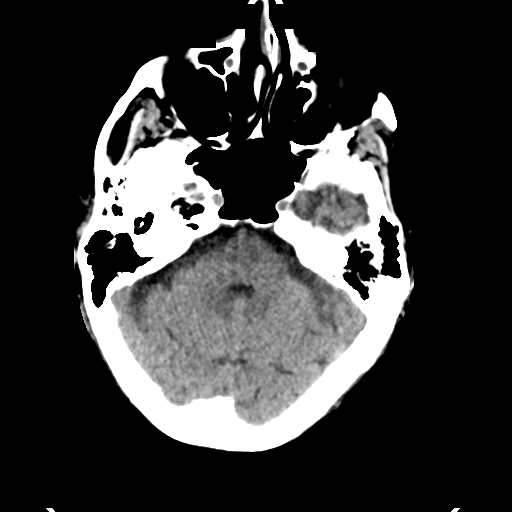
[im 9/32  bone]
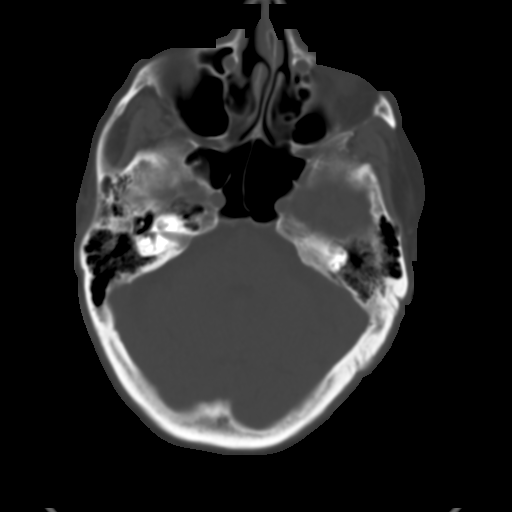
[im 11/32  brain]
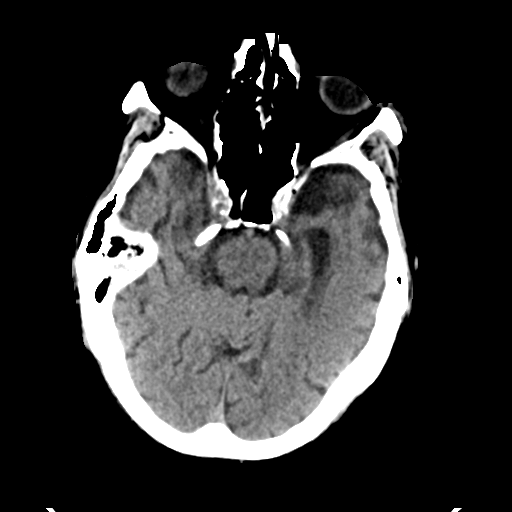
[im 13/32  brain]
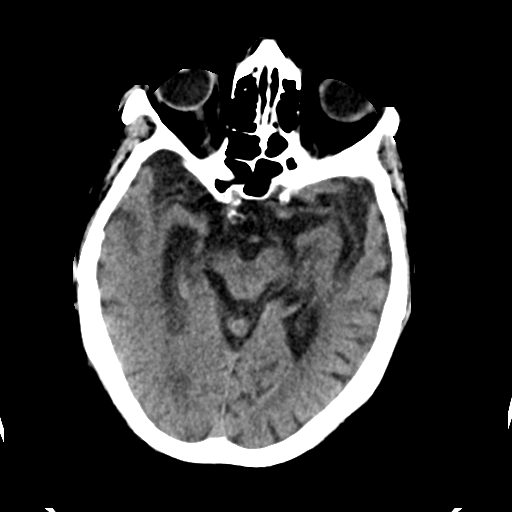
[im 15/32  brain]
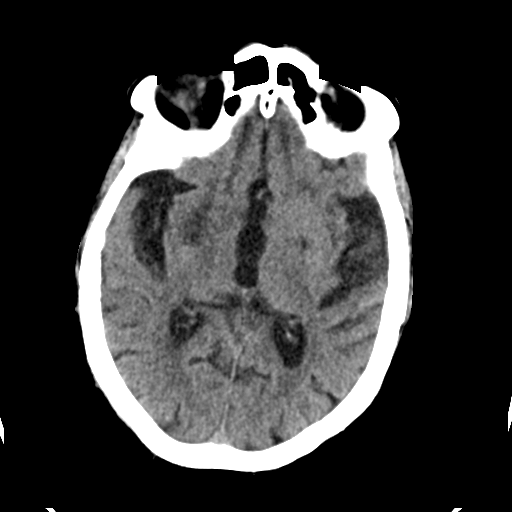
[im 17/32  brain]
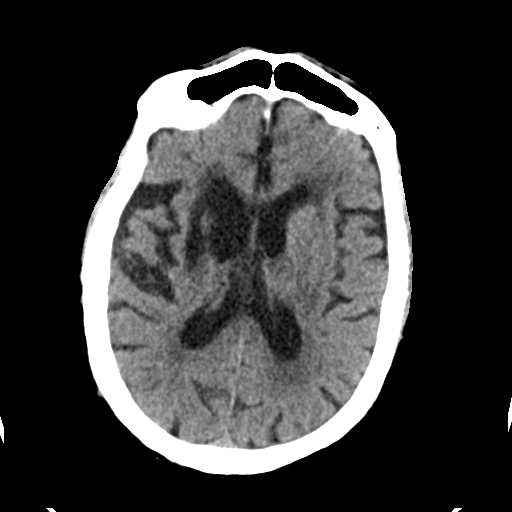
[im 17/32  bone]
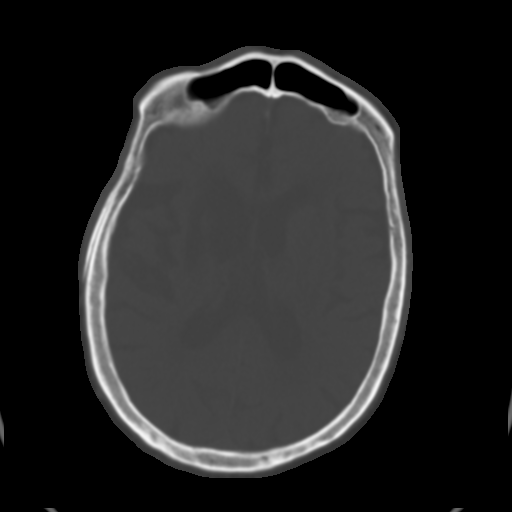
[im 19/32  brain]
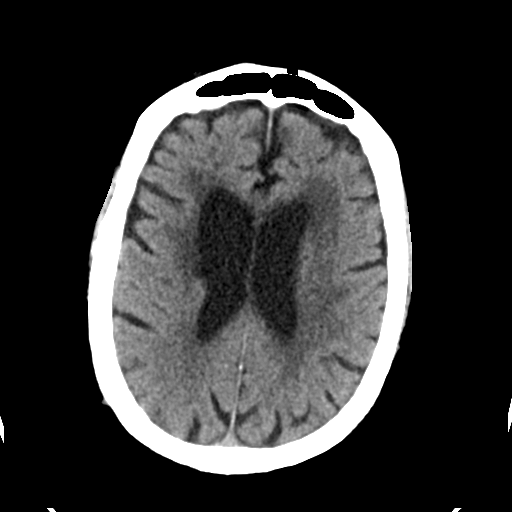
[im 21/32  brain]
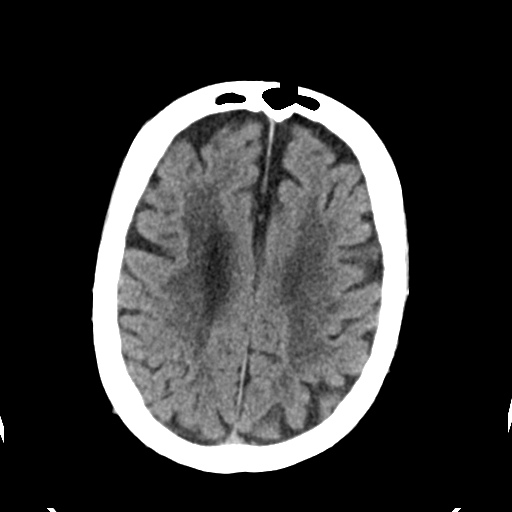
[im 23/32  brain]
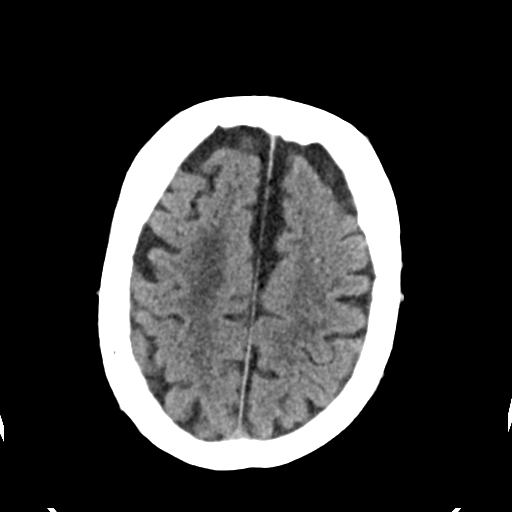
[im 24/32  brain]
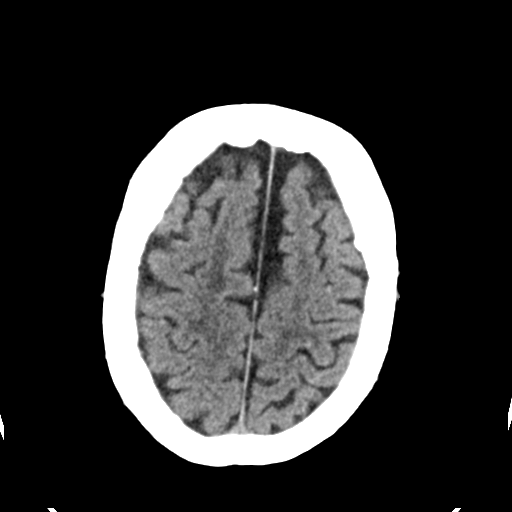
[im 24/32  bone]
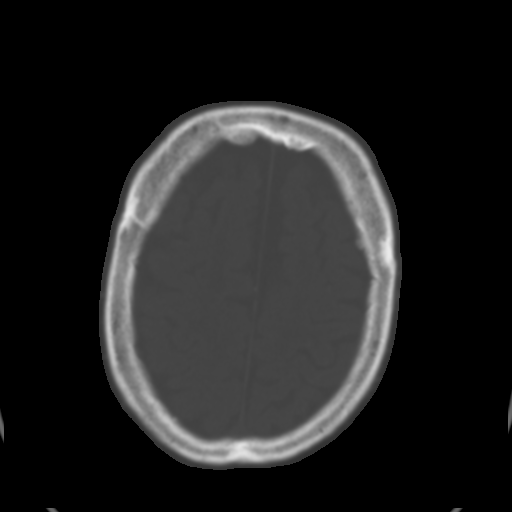
[im 26/32  brain]
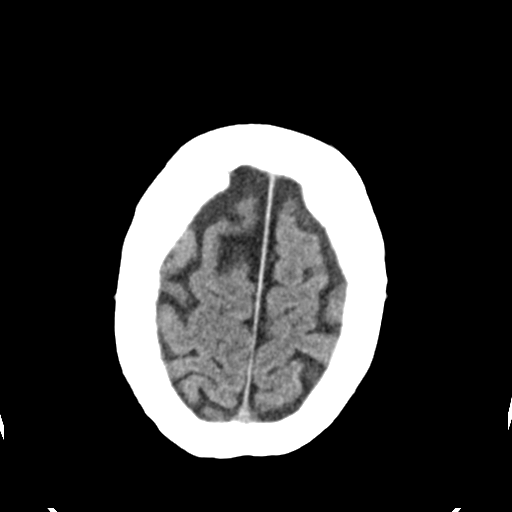
[im 28/32  brain]
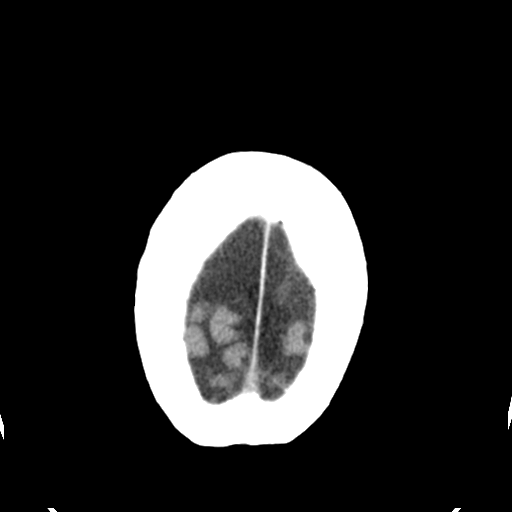
[im 30/32  brain]
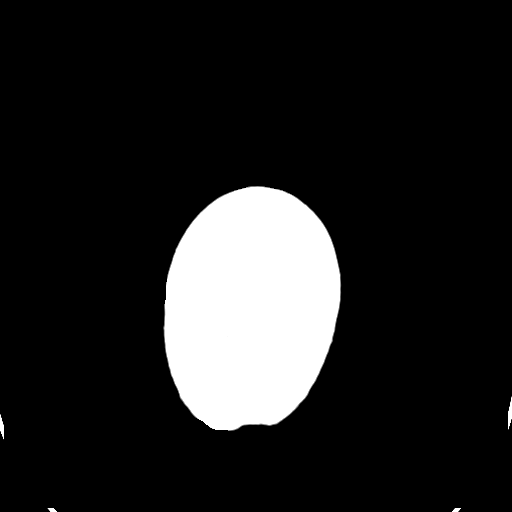

[16 of 30 positions shown; findings below may reference images not displayed]

FINDINGS: Bony calvarium appears intact. Diffuse cortical atrophy is noted.
Chronic ischemic white matter disease is noted. Stable old
infarction of right basal ganglia is noted. No mass effect or
midline shift is noted. Ventricular size is within normal limits.
There is no evidence of mass lesion, hemorrhage or acute infarction.
IMPRESSION: Diffuse cortical atrophy. Chronic ischemic white matter disease. Old
infarction of right basal ganglia. No acute intracranial abnormality
seen.
